# Patient Record
Sex: Female | Born: 1940 | Race: White | Hispanic: No | Marital: Married | Smoking: Never smoker
Health system: Southern US, Community
[De-identification: ages and names within clinical notes are randomized; demographics above are authoritative.]

## PROBLEM LIST (undated history)

## (undated) DIAGNOSIS — I4891 Unspecified atrial fibrillation: Secondary | ICD-10-CM

## (undated) DIAGNOSIS — K219 Gastro-esophageal reflux disease without esophagitis: Secondary | ICD-10-CM

## (undated) DIAGNOSIS — IMO0002 Reserved for concepts with insufficient information to code with codable children: Secondary | ICD-10-CM

## (undated) DIAGNOSIS — I471 Supraventricular tachycardia, unspecified: Secondary | ICD-10-CM

## (undated) DIAGNOSIS — N200 Calculus of kidney: Secondary | ICD-10-CM

## (undated) DIAGNOSIS — E7800 Pure hypercholesterolemia, unspecified: Secondary | ICD-10-CM

## (undated) DIAGNOSIS — N816 Rectocele: Secondary | ICD-10-CM

## (undated) DIAGNOSIS — M1990 Unspecified osteoarthritis, unspecified site: Secondary | ICD-10-CM

## (undated) HISTORY — PX: CHOLECYSTECTOMY: SHX55

## (undated) HISTORY — PX: CATARACT EXTRACTION: SUR2

## (undated) HISTORY — PX: FOOT SURGERY: SHX648

## (undated) HISTORY — PX: APPENDECTOMY: SHX54

## (undated) HISTORY — PX: HAND SURGERY: SHX662

## (undated) HISTORY — PX: ROTATOR CUFF REPAIR: SHX139

## (undated) HISTORY — PX: ABDOMINAL HYSTERECTOMY: SHX81

---

## 1998-01-27 ENCOUNTER — Ambulatory Visit (HOSPITAL_COMMUNITY): Admission: RE | Admit: 1998-01-27 | Discharge: 1998-01-27 | Payer: Self-pay | Admitting: Obstetrics and Gynecology

## 1999-02-04 ENCOUNTER — Ambulatory Visit (HOSPITAL_COMMUNITY): Admission: RE | Admit: 1999-02-04 | Discharge: 1999-02-04 | Payer: Self-pay | Admitting: Internal Medicine

## 1999-02-04 ENCOUNTER — Encounter: Payer: Self-pay | Admitting: Internal Medicine

## 1999-12-29 ENCOUNTER — Other Ambulatory Visit: Admission: RE | Admit: 1999-12-29 | Discharge: 1999-12-29 | Payer: Self-pay | Admitting: Podiatry

## 2000-02-10 ENCOUNTER — Encounter: Payer: Self-pay | Admitting: Obstetrics and Gynecology

## 2000-02-10 ENCOUNTER — Ambulatory Visit (HOSPITAL_COMMUNITY): Admission: RE | Admit: 2000-02-10 | Discharge: 2000-02-10 | Payer: Self-pay | Admitting: Obstetrics and Gynecology

## 2001-03-20 ENCOUNTER — Ambulatory Visit (HOSPITAL_COMMUNITY): Admission: RE | Admit: 2001-03-20 | Discharge: 2001-03-20 | Payer: Self-pay | Admitting: Obstetrics and Gynecology

## 2001-03-20 ENCOUNTER — Encounter: Payer: Self-pay | Admitting: Obstetrics and Gynecology

## 2001-12-14 ENCOUNTER — Ambulatory Visit (HOSPITAL_COMMUNITY): Admission: RE | Admit: 2001-12-14 | Discharge: 2001-12-14 | Payer: Self-pay | Admitting: Otolaryngology

## 2001-12-14 ENCOUNTER — Encounter: Payer: Self-pay | Admitting: Otolaryngology

## 2001-12-19 ENCOUNTER — Encounter: Payer: Self-pay | Admitting: Otolaryngology

## 2001-12-19 ENCOUNTER — Ambulatory Visit (HOSPITAL_COMMUNITY): Admission: RE | Admit: 2001-12-19 | Discharge: 2001-12-19 | Payer: Self-pay | Admitting: Interventional Radiology

## 2002-03-21 ENCOUNTER — Ambulatory Visit (HOSPITAL_COMMUNITY): Admission: RE | Admit: 2002-03-21 | Discharge: 2002-03-21 | Payer: Self-pay | Admitting: Internal Medicine

## 2002-03-21 ENCOUNTER — Encounter: Payer: Self-pay | Admitting: Internal Medicine

## 2002-04-25 ENCOUNTER — Ambulatory Visit (HOSPITAL_COMMUNITY): Admission: RE | Admit: 2002-04-25 | Discharge: 2002-04-25 | Payer: Self-pay | Admitting: Interventional Radiology

## 2003-03-25 ENCOUNTER — Ambulatory Visit (HOSPITAL_COMMUNITY): Admission: RE | Admit: 2003-03-25 | Discharge: 2003-03-25 | Payer: Self-pay | Admitting: Obstetrics and Gynecology

## 2003-06-19 ENCOUNTER — Encounter: Admission: RE | Admit: 2003-06-19 | Discharge: 2003-06-19 | Payer: Self-pay | Admitting: Obstetrics and Gynecology

## 2004-02-09 ENCOUNTER — Ambulatory Visit (HOSPITAL_BASED_OUTPATIENT_CLINIC_OR_DEPARTMENT_OTHER): Admission: RE | Admit: 2004-02-09 | Discharge: 2004-02-09 | Payer: Self-pay | Admitting: Orthopedic Surgery

## 2004-02-09 ENCOUNTER — Ambulatory Visit (HOSPITAL_COMMUNITY): Admission: RE | Admit: 2004-02-09 | Discharge: 2004-02-09 | Payer: Self-pay | Admitting: Orthopedic Surgery

## 2004-03-30 ENCOUNTER — Ambulatory Visit (HOSPITAL_COMMUNITY): Admission: RE | Admit: 2004-03-30 | Discharge: 2004-03-30 | Payer: Self-pay | Admitting: Obstetrics and Gynecology

## 2005-03-31 ENCOUNTER — Ambulatory Visit (HOSPITAL_COMMUNITY): Admission: RE | Admit: 2005-03-31 | Discharge: 2005-03-31 | Payer: Self-pay | Admitting: Obstetrics and Gynecology

## 2005-04-18 ENCOUNTER — Encounter: Admission: RE | Admit: 2005-04-18 | Discharge: 2005-04-18 | Payer: Self-pay | Admitting: Obstetrics and Gynecology

## 2006-04-20 ENCOUNTER — Ambulatory Visit (HOSPITAL_COMMUNITY): Admission: RE | Admit: 2006-04-20 | Discharge: 2006-04-20 | Payer: Self-pay | Admitting: Obstetrics and Gynecology

## 2006-06-20 ENCOUNTER — Ambulatory Visit (HOSPITAL_COMMUNITY): Admission: RE | Admit: 2006-06-20 | Discharge: 2006-06-21 | Payer: Self-pay | Admitting: Orthopedic Surgery

## 2007-04-26 ENCOUNTER — Ambulatory Visit (HOSPITAL_COMMUNITY): Admission: RE | Admit: 2007-04-26 | Discharge: 2007-04-26 | Payer: Self-pay | Admitting: Internal Medicine

## 2008-05-15 ENCOUNTER — Ambulatory Visit (HOSPITAL_COMMUNITY): Admission: RE | Admit: 2008-05-15 | Discharge: 2008-05-15 | Payer: Self-pay | Admitting: Obstetrics and Gynecology

## 2009-05-19 ENCOUNTER — Ambulatory Visit (HOSPITAL_COMMUNITY): Admission: RE | Admit: 2009-05-19 | Discharge: 2009-05-19 | Payer: Self-pay | Admitting: Internal Medicine

## 2009-08-22 ENCOUNTER — Emergency Department (HOSPITAL_BASED_OUTPATIENT_CLINIC_OR_DEPARTMENT_OTHER): Admission: EM | Admit: 2009-08-22 | Discharge: 2009-08-23 | Payer: Self-pay | Admitting: Emergency Medicine

## 2009-08-22 ENCOUNTER — Ambulatory Visit: Payer: Self-pay | Admitting: Diagnostic Radiology

## 2010-02-21 ENCOUNTER — Encounter: Payer: Self-pay | Admitting: Obstetrics and Gynecology

## 2010-04-17 LAB — DIFFERENTIAL
Basophils Absolute: 0 10*3/uL (ref 0.0–0.1)
Basophils Relative: 1 % (ref 0–1)
Eosinophils Absolute: 0.4 10*3/uL (ref 0.0–0.7)
Monocytes Relative: 7 % (ref 3–12)
Neutro Abs: 3.1 10*3/uL (ref 1.7–7.7)
Neutrophils Relative %: 55 % (ref 43–77)

## 2010-04-17 LAB — COMPREHENSIVE METABOLIC PANEL
AST: 101 U/L — ABNORMAL HIGH (ref 0–37)
Albumin: 3.9 g/dL (ref 3.5–5.2)
Alkaline Phosphatase: 97 U/L (ref 39–117)
CO2: 29 mEq/L (ref 19–32)
Calcium: 9.9 mg/dL (ref 8.4–10.5)
Chloride: 105 mEq/L (ref 96–112)
GFR calc non Af Amer: >60 mL/min (ref >60)
Sodium: 144 mEq/L (ref 135–145)
Total Protein: 6.9 g/dL (ref 6.0–8.3)

## 2010-04-17 LAB — CBC
Hemoglobin: 13.6 g/dL (ref 12.0–15.0)
RBC: 4.4 MIL/uL (ref 3.87–5.11)
WBC: 5.5 10*3/uL (ref 4.0–10.5)

## 2010-04-19 ENCOUNTER — Other Ambulatory Visit (HOSPITAL_COMMUNITY): Payer: Self-pay | Admitting: *Deleted

## 2010-04-19 ENCOUNTER — Other Ambulatory Visit: Payer: Self-pay

## 2010-04-19 DIAGNOSIS — Z1231 Encounter for screening mammogram for malignant neoplasm of breast: Secondary | ICD-10-CM

## 2010-05-26 ENCOUNTER — Ambulatory Visit (HOSPITAL_COMMUNITY): Payer: Medicare Other

## 2010-06-02 ENCOUNTER — Ambulatory Visit (HOSPITAL_COMMUNITY)
Admission: RE | Admit: 2010-06-02 | Discharge: 2010-06-02 | Disposition: A | Discharge status: Home / Self Care | Payer: Medicare Other | Source: Ambulatory Visit | Attending: Obstetrics and Gynecology | Admitting: Obstetrics and Gynecology

## 2010-06-02 DIAGNOSIS — Z1231 Encounter for screening mammogram for malignant neoplasm of breast: Secondary | ICD-10-CM | POA: Insufficient documentation

## 2010-06-11 ENCOUNTER — Other Ambulatory Visit (HOSPITAL_COMMUNITY): Payer: Self-pay | Admitting: Obstetrics and Gynecology

## 2010-06-11 DIAGNOSIS — Z1231 Encounter for screening mammogram for malignant neoplasm of breast: Secondary | ICD-10-CM

## 2010-06-15 NOTE — Op Note (Signed)
Donna Wiley, Donna Wiley              ACCOUNT NO.:  [id number]   MEDICAL RECORD NO.:  [id number]          PATIENT TYPE:  OIB   LOCATION:  2550                         FACILITY:  [facility]   PHYSICIAN:  Dionne Ano. 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   POSTOPERATIVE DIAGNOSIS:  Left shoulder rotator cuff tear with  associated acromioclavicular joint arthritis and impingement syndrome  with a type 2 acromion morphology.   PROCEDURE:  1. Evaluation under anesthesia, right shoulder.  2. Right shoulder open distal clavicle excision.  3. Right shoulder subacromial decompression and bursectomy open in      nature.  4. Right shoulder infraspinatus rotator cuff repair utilizing two 5.5      Arthrex suture anchors and two 4.5 push lock anchors.   SURGEON:  Dionne Ano. Amanda Pea, M.D.   ASSISTANT:  Karie Chimera, P.A.-C.   COMPLICATIONS:  None.   INDICATIONS FOR PROCEDURE:  This patient is a 70 year old female who  presents for the above mentioned diagnosis.  I have counseled her in  regards to the risks and benefits of surgery including the risks of  infection, bleeding, anesthesia, damage to normal structures, and  failure of the surgery to accomplish its intended goals of relieving  symptoms and restoring function.  With this in mind, she desires to  proceed.  All questions have been encouraged and answered  preoperatively.   OPERATIVE PROCEDURE:  The patient was seen by myself and anesthesia,  taken to the operating suite, underwent prophylactic antibiotic  administration in the holding area. In addition to this, she was given  an interscalene block.  Once in the operative suite, she was placed in a  beach chair  position after general anesthesia was instituted.  She was  appropriately padded, pulses checked, and a sterile field was secured  with Hibiclens prep x2 separate preps.  Following this, the patient then  underwent sterile draping.  Outline marks were made about the shoulder  and the operation commenced with a transverse incision along Langer's  line, 1 to 1.5 cm below the anterolateral border of the acromion and  swinging posteriorly.  The deltoid musculature was identified and a  plane between the deltoid and subcu was created with scissors tip.   Following this, I entered the interval between the anterior and middle  deltoid fibers and was able to gain access to the shoulder joint.  The  patient's bursa was identified and she underwent a complete bursectomy  without difficulty.  She had a large amount of very thickened and  inflamed bursal tissue.  Following this, the patient then underwent  identification of the rotator cuff.  There was an obvious large  infraspinatus tear.  Once this was done, I then performed evaluation of  the patient's subacromial space which showed a large subacromial spur  and a types 2-3 acromion morphology.  With  a of combination rasp,  rongeur, and cautery device, I performed a subacromial decompression.  Thus, bursectomy and subacromial decompression were performed without  difficulty.   I did leave the coracoacromial arch intact.  At this time, I made a  small incision over the distal clavicle and dissected down to remove the  distal cm of the clavicle with an oscillating saw.  The patient had the  area washed out nicely and Vicryl suture was used to close the interval  between the muscle and fascia.  Following this, this was irrigated and  closed with Prolene.   Attention was then turned towards the rotator cuff.  The rotator cuff  was mobilized. Nonviable rotator cuff tissue was sharply excised with  the knife blade and following this, I then performed  a very careful  slight decortication of the area just lateral to the articular surface.  This was fairly significant, 3.5 to 4 cm wide, cuff tear.  Following  this, I placed two 5.5 Arthrex suture anchors just off of the articular  bony surface interval.  Once this was done with two 5.5 Arthrex suture  anchors, I then placed the eight limbs of the suture through the rotator  cuff quite medially.  This was then tied down nicely and advanced the  rotator cuff.  Excellent rotator cuff tissue was advanced into the space  created.  Once this was done, I then performed placement of two 4.5 push  lock anchors to further harness the repair.  I was pleased with the  findings.  I felt that we had a very nice repair of the tissue and full  range of motion was restored with no evidence of impingement.   Thus, rotator cuff repair, subacromial decompression, distal clavicle  excision, and bursectomy was performed without difficulty.  Following  this, the interval about the deltoid was repaired and the subcu was  repaired with Vicryl followed by closure of the skin edge with Prolene.  I was pleased with the findings and the closure.  Following this, she  was placed in a sterile dressing and shoulder immobilization device.  She will be admitted for IV antibiotics, general postop observation, and  will be admitted 23 hours.  All questions have been encouraged and  answered.   She will be seen back in the office in 7-10 days.  She will begin  passive range of motion for the first four weeks, active assisted range  of motion weeks 4 through 8, active range of motion at week 8 to 10.  We  will begin some specific support training as necessary at approximately  14 to 16 weeks postop.  The patient was quite well and pleased with the  progress.  The patient should do quite well, in my opinion, and had an  excellent repair.           ______________________________ Dionne Ano. Everlene Other, M.D.      Nash Mantis  D:  06/20/2006  T:  06/20/2006  Job:  [id number]

## 2010-11-08 ENCOUNTER — Encounter: Payer: Self-pay | Admitting: Family Medicine

## 2010-11-08 ENCOUNTER — Emergency Department (INDEPENDENT_AMBULATORY_CARE_PROVIDER_SITE_OTHER): Payer: Medicare Other

## 2010-11-08 ENCOUNTER — Emergency Department (HOSPITAL_BASED_OUTPATIENT_CLINIC_OR_DEPARTMENT_OTHER)
Admission: EM | Admit: 2010-11-08 | Discharge: 2010-11-08 | Disposition: A | Discharge status: Home / Self Care | Payer: Medicare Other | Attending: Emergency Medicine | Admitting: Emergency Medicine

## 2010-11-08 DIAGNOSIS — K219 Gastro-esophageal reflux disease without esophagitis: Secondary | ICD-10-CM | POA: Insufficient documentation

## 2010-11-08 DIAGNOSIS — Z8739 Personal history of other diseases of the musculoskeletal system and connective tissue: Secondary | ICD-10-CM | POA: Insufficient documentation

## 2010-11-08 DIAGNOSIS — N390 Urinary tract infection, site not specified: Secondary | ICD-10-CM | POA: Insufficient documentation

## 2010-11-08 DIAGNOSIS — R109 Unspecified abdominal pain: Secondary | ICD-10-CM | POA: Insufficient documentation

## 2010-11-08 DIAGNOSIS — E119 Type 2 diabetes mellitus without complications: Secondary | ICD-10-CM | POA: Insufficient documentation

## 2010-11-08 DIAGNOSIS — M549 Dorsalgia, unspecified: Secondary | ICD-10-CM

## 2010-11-08 HISTORY — DX: Rectocele: N81.6

## 2010-11-08 HISTORY — DX: Pure hypercholesterolemia, unspecified: E78.00

## 2010-11-08 HISTORY — DX: Reserved for concepts with insufficient information to code with codable children: IMO0002

## 2010-11-08 HISTORY — DX: Unspecified atrial fibrillation: I48.91

## 2010-11-08 HISTORY — DX: Unspecified osteoarthritis, unspecified site: M19.90

## 2010-11-08 HISTORY — DX: Supraventricular tachycardia, unspecified: I47.10

## 2010-11-08 HISTORY — DX: Supraventricular tachycardia: I47.1

## 2010-11-08 HISTORY — DX: Gastro-esophageal reflux disease without esophagitis: K21.9

## 2010-11-08 LAB — BASIC METABOLIC PANEL
BUN: 23 mg/dL (ref 6–23)
CO2: 27 mEq/L (ref 19–32)
Calcium: 9.7 mg/dL (ref 8.4–10.5)
Creatinine, Ser: 0.7 mg/dL (ref 0.50–1.10)
GFR calc Af Amer: >90 mL/min (ref >90)
GFR calc non Af Amer: 86 mL/min — ABNORMAL LOW (ref >90)
Potassium: 3.6 mEq/L (ref 3.5–5.1)
Sodium: 139 mEq/L (ref 135–145)

## 2010-11-08 LAB — URINALYSIS, ROUTINE W REFLEX MICROSCOPIC
Bilirubin Urine: NEGATIVE
Glucose, UA: NEGATIVE mg/dL
Protein, ur: >300 mg/dL — AB
Specific Gravity, Urine: 1.024 (ref 1.005–1.030)
Urobilinogen, UA: 0.2 mg/dL (ref 0.0–1.0)
pH: 6 (ref 5.0–8.0)

## 2010-11-08 LAB — URINE MICROSCOPIC-ADD ON

## 2010-11-08 LAB — CBC
HCT: 40.2 % (ref 36.0–46.0)
MCV: 90.1 fL (ref 78.0–100.0)
Platelets: 257 10*3/uL (ref 150–400)
RBC: 4.46 MIL/uL (ref 3.87–5.11)
RDW: 12.9 % (ref 11.5–15.5)
WBC: 10.1 10*3/uL (ref 4.0–10.5)

## 2010-11-08 LAB — DIFFERENTIAL
Lymphocytes Relative: 8 % — ABNORMAL LOW (ref 12–46)
Neutro Abs: 8.4 10*3/uL — ABNORMAL HIGH (ref 1.7–7.7)

## 2010-11-08 LAB — PROTIME-INR: Prothrombin Time: 15.9 seconds — ABNORMAL HIGH (ref 11.6–15.2)

## 2010-11-08 MED ORDER — IOHEXOL 300 MG/ML  SOLN
100.0000 mL | Freq: Once | INTRAMUSCULAR | Status: AC | PRN
  Administered 2010-11-08: 100 mL via INTRAVENOUS

## 2010-11-08 MED ORDER — MORPHINE SULFATE 2 MG/ML IJ SOLN
2.0000 mg | Freq: Once | INTRAMUSCULAR | Status: AC
  Administered 2010-11-08: 2 mg via INTRAVENOUS
  Filled 2010-11-08: qty 1

## 2010-11-08 MED ORDER — HYDROCORTISONE SOD SUCCINATE 100 MG IJ SOLR
100.0000 mg | Freq: Once | INTRAMUSCULAR | Status: AC
  Administered 2010-11-08: 100 mg via INTRAVENOUS
  Filled 2010-11-08: qty 2

## 2010-11-08 MED ORDER — NITROFURANTOIN MONOHYD MACRO 100 MG PO CAPS: 100.0000 mg | ORAL_CAPSULE | Freq: Two times a day (BID) | ORAL | Status: AC

## 2010-11-08 MED ORDER — DEXTROSE 5 % IV SOLN
1.0000 g | Freq: Once | INTRAVENOUS | Status: AC
  Administered 2010-11-08: 1 g via INTRAVENOUS
  Filled 2010-11-08: qty 1

## 2010-11-08 MED ORDER — DIPHENHYDRAMINE HCL 50 MG/ML IJ SOLN
50.0000 mg | Freq: Once | INTRAMUSCULAR | Status: AC
  Administered 2010-11-08: 50 mg via INTRAVENOUS
  Filled 2010-11-08: qty 1

## 2010-11-08 MED ORDER — SODIUM CHLORIDE 0.9 % IV SOLN
INTRAVENOUS | Status: DC
  Administered 2010-11-08: 12:00:00 via INTRAVENOUS

## 2010-11-08 MED ORDER — SODIUM CHLORIDE 0.9 % IV BOLUS (SEPSIS)
1000.0000 mL | Freq: Once | INTRAVENOUS | Status: AC
  Administered 2010-11-08: 1000 mL via INTRAVENOUS

## 2010-11-08 NOTE — ED Notes (Signed)
Pt pre-medicated for CT

## 2010-11-08 NOTE — ED Provider Notes (Signed)
History     CSN: [id number] Arrival date & time: 11/08/2010  8:29 AM  Chief Complaint  Patient presents with  . Flank Pain    (Consider location/radiation/quality/duration/timing/severity/associated sxs/prior treatment) HPI Pt reports she has had bilateral lower back pain for the last 2 days, constant, moderate aching pain that has since migrated to her bilateral lower abdomen. Pain is associated with urinary frequency but no dysuria. She has had no nausea, vomiting, diarrhea. No fever. No blood in stool or urine. No prior history of same. She is taking Xarelto for atrial fibrillation, but denies any trauma or falls.    Past Medical History  Diagnosis Date  . Diabetes mellitus   . SVT (supraventricular tachycardia)   . Arthritis   . GERD (gastroesophageal reflux disease)   . Asthma   . High cholesterol   . Cystocele   . Rectocele   . Atrial fibrillation     Past Surgical History  Procedure Date  . Abdominal hysterectomy   . Cataract extraction   . Foot surgery   . Cholecystectomy   . Appendectomy     No family history on file.  History  Substance Use Topics  . Smoking status: Never Smoker   . Smokeless tobacco: Not on file  . Alcohol Use: No    OB History    Grav Para Term Preterm Abortions TAB SAB Ect Mult Living                  Review of Systems All other systems reviewed and are negative except as noted in HPI.   Allergies  Codeine; Ivp dye; Other; and Sulfa antibiotics  Home Medications   Current Outpatient Rx  Name Route Sig Dispense Refill  . DILTIAZEM HCL COATED BEADS 180 MG PO CP24 Oral Take 180 mg by mouth daily.      Marland Kitchen FLECAINIDE ACETATE 150 MG PO TABS Oral Take 150 mg by mouth 2 (two) times daily.      Marland Kitchen GLIPIZIDE 5 MG PO TABS Oral Take 5 mg by mouth 2 (two) times daily before a meal.      . RIVAROXABAN 20 MG PO TABS Oral Take by mouth.      Marland Kitchen SIMVASTATIN 20 MG PO TABS Oral Take 20 mg by mouth at bedtime.      Marland Kitchen ZOLPIDEM TARTRATE 5 MG PO  TABS Oral Take 5 mg by mouth at bedtime as needed.        BP 147/70  Pulse 94  Temp(Src) 98.9 F (37.2 C) (Oral)  Resp 20  SpO2 99%  Physical Exam  Nursing note and vitals reviewed. Constitutional: She is oriented to person, place, and time. She appears well-developed and well-nourished.  HENT:  Head: Normocephalic and atraumatic.  Eyes: EOM are normal. Pupils are equal, round, and reactive to light.  Neck: Normal range of motion. Neck supple.  Cardiovascular: Normal rate, normal heart sounds and intact distal pulses.   Pulmonary/Chest: Effort normal and breath sounds normal.  Abdominal: Bowel sounds are normal. She exhibits no distension. There is no tenderness.  Genitourinary:       Mild L CVA tenderness  Musculoskeletal: Normal range of motion. She exhibits tenderness. She exhibits no edema.       Tender over the lower back L>R   Neurological: She is alert and oriented to person, place, and time. She has normal strength. No cranial nerve deficit or sensory deficit.  Skin: Skin is warm and dry. No rash noted.  Psychiatric: She  has a normal mood and affect.    ED Course  Procedures (including critical care time)   Labs Reviewed  CBC  DIFFERENTIAL  BASIC METABOLIC PANEL  URINALYSIS, ROUTINE W REFLEX MICROSCOPIC  URINE CULTURE   No results found.   No diagnosis found.    MDM  Pt reports an IV contrast dye allergy, but on further questioning states she has a severe seafood allergy and has been told she couldn't have IV dye due to the iodine. She has had non-iodinated IV dye before without incidence. Will pretreat with hydrocortisone and benadryl to be safe, but we do not use iodinate CT contrast and I suspect the likelihood of an anaphylactic reaction will be low.       12:29 PM Pt still having urinary frequency but otherwise doing well. UA consistent with UTI/Pyelo but no stones or other concerning findings on CT such as retroperitoneal hematoma. Pt given  Rocephin in the ED. Will d/c with Macrobid and PCP follow up. Culture sent.   Charles B. Bernette Mayers, MD 11/08/10 1231

## 2010-11-08 NOTE — ED Notes (Signed)
Pt c/o left flank pain since Saturday with urinary frequency and dysuria. Pt sts pain moved across low abdomen this morning. Pt denies fever, n/v/d.

## 2010-11-10 LAB — URINE CULTURE: Culture  Setup Time: 201210081450

## 2010-11-11 NOTE — ED Notes (Signed)
+   Urine] Patient treated with Macrobid-sensitive to same-chart appended per protocol MD. 

## 2011-05-06 ENCOUNTER — Other Ambulatory Visit (HOSPITAL_COMMUNITY): Payer: Self-pay | Admitting: Obstetrics and Gynecology

## 2011-05-06 DIAGNOSIS — Z1231 Encounter for screening mammogram for malignant neoplasm of breast: Secondary | ICD-10-CM

## 2011-06-08 ENCOUNTER — Ambulatory Visit (HOSPITAL_COMMUNITY)
Admission: RE | Admit: 2011-06-08 | Discharge: 2011-06-08 | Disposition: A | Discharge status: Home / Self Care | Payer: Medicare Other | Source: Ambulatory Visit | Attending: Obstetrics and Gynecology | Admitting: Obstetrics and Gynecology

## 2011-06-08 DIAGNOSIS — Z1231 Encounter for screening mammogram for malignant neoplasm of breast: Secondary | ICD-10-CM | POA: Insufficient documentation

## 2011-11-29 ENCOUNTER — Other Ambulatory Visit: Payer: Self-pay | Admitting: Otolaryngology

## 2011-11-29 DIAGNOSIS — H905 Unspecified sensorineural hearing loss: Secondary | ICD-10-CM

## 2011-12-06 ENCOUNTER — Ambulatory Visit
Admission: RE | Admit: 2011-12-06 | Discharge: 2011-12-06 | Disposition: A | Discharge status: Home / Self Care | Payer: Medicare Other | Source: Ambulatory Visit | Attending: Otolaryngology | Admitting: Otolaryngology

## 2011-12-06 DIAGNOSIS — H905 Unspecified sensorineural hearing loss: Secondary | ICD-10-CM

## 2012-05-09 ENCOUNTER — Other Ambulatory Visit (HOSPITAL_COMMUNITY): Payer: Self-pay | Admitting: Obstetrics and Gynecology

## 2012-05-09 DIAGNOSIS — Z1231 Encounter for screening mammogram for malignant neoplasm of breast: Secondary | ICD-10-CM

## 2012-06-13 ENCOUNTER — Ambulatory Visit (HOSPITAL_COMMUNITY): Payer: Medicare Other

## 2012-06-20 ENCOUNTER — Ambulatory Visit (HOSPITAL_COMMUNITY)
Admission: RE | Admit: 2012-06-20 | Discharge: 2012-06-20 | Disposition: A | Discharge status: Home / Self Care | Payer: Medicare Other | Source: Ambulatory Visit | Attending: Obstetrics and Gynecology | Admitting: Obstetrics and Gynecology

## 2012-06-20 DIAGNOSIS — Z1231 Encounter for screening mammogram for malignant neoplasm of breast: Secondary | ICD-10-CM

## 2013-05-20 ENCOUNTER — Other Ambulatory Visit (HOSPITAL_COMMUNITY): Payer: Self-pay | Admitting: Internal Medicine

## 2013-05-20 DIAGNOSIS — Z1231 Encounter for screening mammogram for malignant neoplasm of breast: Secondary | ICD-10-CM

## 2013-06-25 ENCOUNTER — Ambulatory Visit (HOSPITAL_COMMUNITY)
Admission: RE | Admit: 2013-06-25 | Discharge: 2013-06-25 | Disposition: A | Discharge status: Home / Self Care | Payer: Medicare Other | Source: Ambulatory Visit | Attending: Internal Medicine | Admitting: Internal Medicine

## 2013-06-25 DIAGNOSIS — Z1231 Encounter for screening mammogram for malignant neoplasm of breast: Secondary | ICD-10-CM

## 2014-05-27 ENCOUNTER — Other Ambulatory Visit (HOSPITAL_COMMUNITY): Payer: Self-pay | Admitting: Obstetrics and Gynecology

## 2014-05-27 DIAGNOSIS — Z1231 Encounter for screening mammogram for malignant neoplasm of breast: Secondary | ICD-10-CM

## 2014-07-01 ENCOUNTER — Ambulatory Visit (HOSPITAL_COMMUNITY)
Admission: RE | Admit: 2014-07-01 | Discharge: 2014-07-01 | Disposition: A | Discharge status: Home / Self Care | Payer: Medicare Other | Source: Ambulatory Visit | Attending: Obstetrics and Gynecology | Admitting: Obstetrics and Gynecology

## 2014-07-01 DIAGNOSIS — Z1231 Encounter for screening mammogram for malignant neoplasm of breast: Secondary | ICD-10-CM | POA: Diagnosis present

## 2014-08-11 ENCOUNTER — Encounter (HOSPITAL_BASED_OUTPATIENT_CLINIC_OR_DEPARTMENT_OTHER): Payer: Self-pay | Admitting: *Deleted

## 2014-08-11 ENCOUNTER — Emergency Department (HOSPITAL_BASED_OUTPATIENT_CLINIC_OR_DEPARTMENT_OTHER)
Admission: EM | Admit: 2014-08-11 | Discharge: 2014-08-11 | Disposition: A | Discharge status: Home / Self Care | Payer: Medicare Other | Attending: Emergency Medicine | Admitting: Emergency Medicine

## 2014-08-11 DIAGNOSIS — E780 Pure hypercholesterolemia: Secondary | ICD-10-CM | POA: Insufficient documentation

## 2014-08-11 DIAGNOSIS — T7840XA Allergy, unspecified, initial encounter: Secondary | ICD-10-CM | POA: Diagnosis present

## 2014-08-11 DIAGNOSIS — Z7901 Long term (current) use of anticoagulants: Secondary | ICD-10-CM | POA: Insufficient documentation

## 2014-08-11 DIAGNOSIS — Z8739 Personal history of other diseases of the musculoskeletal system and connective tissue: Secondary | ICD-10-CM | POA: Insufficient documentation

## 2014-08-11 DIAGNOSIS — Y9289 Other specified places as the place of occurrence of the external cause: Secondary | ICD-10-CM | POA: Diagnosis not present

## 2014-08-11 DIAGNOSIS — R220 Localized swelling, mass and lump, head: Secondary | ICD-10-CM | POA: Insufficient documentation

## 2014-08-11 DIAGNOSIS — Z79899 Other long term (current) drug therapy: Secondary | ICD-10-CM | POA: Insufficient documentation

## 2014-08-11 DIAGNOSIS — I4891 Unspecified atrial fibrillation: Secondary | ICD-10-CM | POA: Insufficient documentation

## 2014-08-11 DIAGNOSIS — Y9389 Activity, other specified: Secondary | ICD-10-CM | POA: Diagnosis not present

## 2014-08-11 DIAGNOSIS — Z8719 Personal history of other diseases of the digestive system: Secondary | ICD-10-CM | POA: Diagnosis not present

## 2014-08-11 DIAGNOSIS — I471 Supraventricular tachycardia: Secondary | ICD-10-CM | POA: Diagnosis not present

## 2014-08-11 DIAGNOSIS — X58XXXA Exposure to other specified factors, initial encounter: Secondary | ICD-10-CM | POA: Diagnosis not present

## 2014-08-11 DIAGNOSIS — E119 Type 2 diabetes mellitus without complications: Secondary | ICD-10-CM | POA: Diagnosis not present

## 2014-08-11 DIAGNOSIS — J45909 Unspecified asthma, uncomplicated: Secondary | ICD-10-CM | POA: Diagnosis not present

## 2014-08-11 DIAGNOSIS — Y998 Other external cause status: Secondary | ICD-10-CM | POA: Diagnosis not present

## 2014-08-11 MED ORDER — PREDNISONE 10 MG PO TABS: 20.0000 mg | ORAL_TABLET | Freq: Two times a day (BID) | ORAL | Status: DC

## 2014-08-11 MED ORDER — PREDNISONE 20 MG PO TABS
40.0000 mg | ORAL_TABLET | Freq: Once | ORAL | Status: AC
  Administered 2014-08-11: 40 mg via ORAL
  Filled 2014-08-11: qty 2

## 2014-08-11 NOTE — ED Notes (Addendum)
Pt c/o "allergic reaction  To strawberries" x 1 hr ago Benadryl 50 mg PTA , pt states tongue is large, no difficulty speaking and no resp distress noted

## 2014-08-11 NOTE — Discharge Instructions (Signed)
Prednisone as prescribed.  Benadryl 25 mg every 6 hours for the next 2 days.  Return to the emergency department if symptoms significantly worsen or change.   Allergies Allergies may happen from anything your body is sensitive to. This may be food, medicines, pollens, chemicals, and nearly anything around you in everyday life that produces allergens. An allergen is anything that causes an allergy producing substance. Heredity is often a factor in causing these problems. This means you may have some of the same allergies as your parents. Food allergies happen in all age groups. Food allergies are some of the most severe and life threatening. Some common food allergies are cow's milk, seafood, eggs, nuts, wheat, and soybeans. SYMPTOMS   Swelling around the mouth.  An itchy red rash or hives.  Vomiting or diarrhea.  Difficulty breathing. SEVERE ALLERGIC REACTIONS ARE LIFE-THREATENING. This reaction is called anaphylaxis. It can cause the mouth and throat to swell and cause difficulty with breathing and swallowing. In severe reactions only a trace amount of food (for example, peanut oil in a salad) may cause death within seconds. Seasonal allergies occur in all age groups. These are seasonal because they usually occur during the same season every year. They may be a reaction to molds, grass pollens, or tree pollens. Other causes of problems are house dust mite allergens, pet dander, and mold spores. The symptoms often consist of nasal congestion, a runny itchy nose associated with sneezing, and tearing itchy eyes. There is often an associated itching of the mouth and ears. The problems happen when you come in contact with pollens and other allergens. Allergens are the particles in the air that the body reacts to with an allergic reaction. This causes you to release allergic antibodies. Through a chain of events, these eventually cause you to release histamine into the blood stream. Although it is  meant to be protective to the body, it is this release that causes your discomfort. This is why you were given anti-histamines to feel better. If you are unable to pinpoint the offending allergen, it may be determined by skin or blood testing. Allergies cannot be cured but can be controlled with medicine. Hay fever is a collection of all or some of the seasonal allergy problems. It may often be treated with simple over-the-counter medicine such as diphenhydramine. Take medicine as directed. Do not drink alcohol or drive while taking this medicine. Check with your caregiver or package insert for child dosages. If these medicines are not effective, there are many new medicines your caregiver can prescribe. Stronger medicine such as nasal spray, eye drops, and corticosteroids may be used if the first things you try do not work well. Other treatments such as immunotherapy or desensitizing injections can be used if all else fails. Follow up with your caregiver if problems continue. These seasonal allergies are usually not life threatening. They are generally more of a nuisance that can often be handled using medicine. HOME CARE INSTRUCTIONS   If unsure what causes a reaction, keep a diary of foods eaten and symptoms that follow. Avoid foods that cause reactions.  If hives or rash are present:  Take medicine as directed.  You may use an over-the-counter antihistamine (diphenhydramine) for hives and itching as needed.  Apply cold compresses (cloths) to the skin or take baths in cool water. Avoid hot baths or showers. Heat will make a rash and itching worse.  If you are severely allergic:  Following a treatment for a severe reaction, hospitalization  is often required for closer follow-up.  Wear a medic-alert bracelet or necklace stating the allergy.  You and your family must learn how to give adrenaline or use an anaphylaxis kit.  If you have had a severe reaction, always carry your anaphylaxis kit  or EpiPen with you. Use this medicine as directed by your caregiver if a severe reaction is occurring. Failure to do so could have a fatal outcome. SEEK MEDICAL CARE IF:  You suspect a food allergy. Symptoms generally happen within 30 minutes of eating a food.  Your symptoms have not gone away within 2 days or are getting worse.  You develop new symptoms.  You want to retest yourself or your child with a food or drink you think causes an allergic reaction. Never do this if an anaphylactic reaction to that food or drink has happened before. Only do this under the care of a caregiver. SEEK IMMEDIATE MEDICAL CARE IF:   You have difficulty breathing, are wheezing, or have a tight feeling in your chest or throat.  You have a swollen mouth, or you have hives, swelling, or itching all over your body.  You have had a severe reaction that has responded to your anaphylaxis kit or an EpiPen. These reactions may return when the medicine has worn off. These reactions should be considered life threatening. MAKE SURE YOU:   Understand these instructions.  Will watch your condition.  Will get help right away if you are not doing well or get worse. Document Released: 04/12/2002 Document Revised: 05/14/2012 Document Reviewed: 09/17/2007 [facility] Patient Information 2015 [place], [place]. This information is not intended to replace advice given to you by your health care provider. Make sure you discuss any questions you have with your health care provider.

## 2014-08-11 NOTE — ED Provider Notes (Signed)
CSN: [id number]     Arrival date & time 08/11/14  2027 History   This chart was scribed for Geoffery Lyonsouglas Tecora Eustache, MD by Budd PalmerVanessa Prueter, ED Scribe. This patient was seen in room MH05/MH05 and the patient's care was started at 9:36 PM.    Chief Complaint  Patient presents with  . Allergic Reaction    Patient is a 74 y.o. female presenting with allergic reaction. The history is provided by the patient. No language interpreter was used.  Allergic Reaction Presenting symptoms: swelling   Severity:  Moderate Prior allergic episodes:  Food/nut allergies Context: food   Relieved by:  None tried Worsened by:  Nothing tried Ineffective treatments:  None tried  HPI Comments: Donna Wiley is a 74 y.o. female who presents to the Emergency Department complaining of an allergic reaction 1 hour ago. She reports associated tongue swelling and throat tingling, but no swelling. Pt suspects it was most likely caused by eating strawberries. She notes a PMHx of heart disease and IDDM. Pt states that she is feeling better since taking 2 benadryl PTA. She denies CP.   Past Medical History  Diagnosis Date  . Diabetes mellitus   . SVT (supraventricular tachycardia)   . Arthritis   . GERD (gastroesophageal reflux disease)   . Asthma   . High cholesterol   . Cystocele   . Rectocele   . Atrial fibrillation    Past Surgical History  Procedure Laterality Date  . Abdominal hysterectomy    . Cataract extraction    . Foot surgery    . Cholecystectomy    . Appendectomy     History reviewed. No pertinent family history. History  Substance Use Topics  . Smoking status: Never Smoker   . Smokeless tobacco: Not on file  . Alcohol Use: No   OB History    No data available     Review of Systems  Cardiovascular: Negative for chest pain.  All other systems reviewed and are negative.   Allergies  Codeine; Ivp dye; Other; Shellfish allergy; and Sulfa antibiotics  Home Medications   Prior to Admission  medications   Medication Sig Start Date End Date Taking? Authorizing Provider  diltiazem (CARTIA XT) 180 MG 24 hr capsule Take 180 mg by mouth daily.      Historical Provider, MD  flecainide (TAMBOCOR) 150 MG tablet Take 150 mg by mouth 2 (two) times daily.      Historical Provider, MD  glipiZIDE (GLUCOTROL) 5 MG tablet Take 5 mg by mouth 2 (two) times daily before a meal.      Historical Provider, MD  Rivaroxaban (XARELTO) 20 MG TABS Take by mouth.      Historical Provider, MD  simvastatin (ZOCOR) 20 MG tablet Take 20 mg by mouth at bedtime.      Historical Provider, MD  zolpidem (AMBIEN) 5 MG tablet Take 5 mg by mouth at bedtime as needed.      Historical Provider, MD   BP 185/85 mmHg  Pulse 76  Temp(Src) 98.9 F (37.2 C) (Oral)  Resp 16  Wt 138 lb (62.596 kg)  SpO2 99% Physical Exam  Constitutional: She is oriented to person, place, and time. She appears well-developed and well-nourished. No distress.  HENT:  Head: Normocephalic and atraumatic.  Mouth/Throat: Oropharynx is clear and moist.  There is mild, if any swelling to the tongue.  Eyes: Conjunctivae and EOM are normal. Pupils are equal, round, and reactive to light.  Neck: Normal range of motion. Neck  supple. No tracheal deviation present.  Cardiovascular: Normal rate, regular rhythm and normal heart sounds.   Pulmonary/Chest: Effort normal and breath sounds normal. No respiratory distress.  Abdominal: Soft.  Musculoskeletal: Normal range of motion.  Neurological: She is alert and oriented to person, place, and time.  Skin: Skin is warm and dry.  Psychiatric: She has a normal mood and affect. Her behavior is normal.  Nursing note and vitals reviewed.   ED Course  Procedures  DIAGNOSTIC STUDIES: Oxygen Saturation is 99% on RA, normal by my interpretation.    COORDINATION OF CARE: 9:40PM - Discussed plans to observe for a while longer. Will order prednisone. Pt advised of plan for treatment and pt agrees.  Labs  Review Labs Reviewed - No data to display  Imaging Review No results found.   EKG Interpretation None      MDM   Final diagnoses:  None    Patient presents with complaints of tongue swelling after eating strawberries one hour prior to arrival. She took Benadryl prior to coming here and symptoms are improving. She has no stridor, wheezing, and is hemodynamically stable. She was given a dose of prednisone here in the ER and observed for an additional hour. She is feeling much better and she feels as though her swelling is improving. She will be discharged with 2 days worth of prednisone and anti-histamines at home and when necessary return.  I personally performed the services described in this documentation, which was scribed in my presence. The recorded information has been reviewed and is accurate.       Geoffery Lyons, MD 08/11/14 [phone]

## 2015-06-01 ENCOUNTER — Other Ambulatory Visit: Payer: Self-pay

## 2015-06-01 DIAGNOSIS — Z1231 Encounter for screening mammogram for malignant neoplasm of breast: Secondary | ICD-10-CM

## 2015-07-06 ENCOUNTER — Ambulatory Visit
Admission: RE | Admit: 2015-07-06 | Discharge: 2015-07-06 | Disposition: A | Discharge status: Home / Self Care | Payer: Medicare Other | Source: Ambulatory Visit

## 2015-07-06 ENCOUNTER — Other Ambulatory Visit: Payer: Self-pay | Admitting: Internal Medicine

## 2015-07-06 DIAGNOSIS — Z1231 Encounter for screening mammogram for malignant neoplasm of breast: Secondary | ICD-10-CM

## 2015-12-10 ENCOUNTER — Emergency Department (HOSPITAL_BASED_OUTPATIENT_CLINIC_OR_DEPARTMENT_OTHER)
Admission: EM | Admit: 2015-12-10 | Discharge: 2015-12-10 | Disposition: A | Discharge status: Other Acute Inpatient Hospital | Payer: Medicare Other | Attending: Emergency Medicine | Admitting: Emergency Medicine

## 2015-12-10 ENCOUNTER — Emergency Department (HOSPITAL_BASED_OUTPATIENT_CLINIC_OR_DEPARTMENT_OTHER): Payer: Medicare Other

## 2015-12-10 ENCOUNTER — Encounter (HOSPITAL_BASED_OUTPATIENT_CLINIC_OR_DEPARTMENT_OTHER): Payer: Self-pay

## 2015-12-10 DIAGNOSIS — R319 Hematuria, unspecified: Secondary | ICD-10-CM | POA: Diagnosis present

## 2015-12-10 DIAGNOSIS — Z794 Long term (current) use of insulin: Secondary | ICD-10-CM | POA: Insufficient documentation

## 2015-12-10 DIAGNOSIS — Z7901 Long term (current) use of anticoagulants: Secondary | ICD-10-CM | POA: Insufficient documentation

## 2015-12-10 DIAGNOSIS — J45909 Unspecified asthma, uncomplicated: Secondary | ICD-10-CM | POA: Diagnosis not present

## 2015-12-10 DIAGNOSIS — N200 Calculus of kidney: Secondary | ICD-10-CM | POA: Diagnosis not present

## 2015-12-10 DIAGNOSIS — E119 Type 2 diabetes mellitus without complications: Secondary | ICD-10-CM | POA: Diagnosis not present

## 2015-12-10 DIAGNOSIS — Z79899 Other long term (current) drug therapy: Secondary | ICD-10-CM | POA: Insufficient documentation

## 2015-12-10 HISTORY — DX: Calculus of kidney: N20.0

## 2015-12-10 LAB — CBC WITH DIFFERENTIAL/PLATELET
BASOS ABS: 0 10*3/uL (ref 0.0–0.1)
BASOS PCT: 0 %
Eosinophils Absolute: 0.1 10*3/uL (ref 0.0–0.7)
Eosinophils Relative: 2 %
HEMATOCRIT: 36.1 % (ref 36.0–46.0)
HEMOGLOBIN: 12 g/dL (ref 12.0–15.0)
LYMPHS PCT: 17 %
Lymphs Abs: 1.2 10*3/uL (ref 0.7–4.0)
MCH: 30.7 pg (ref 26.0–34.0)
MCHC: 33.2 g/dL (ref 30.0–36.0)
MCV: 92.3 fL (ref 78.0–100.0)
MONOS PCT: 7 %
Monocytes Absolute: 0.5 10*3/uL (ref 0.1–1.0)
NEUTROS ABS: 5.4 10*3/uL (ref 1.7–7.7)
NEUTROS PCT: 74 %
Platelets: 248 10*3/uL (ref 150–400)
RBC: 3.91 MIL/uL (ref 3.87–5.11)
RDW: 12.6 % (ref 11.5–15.5)
WBC: 7.2 10*3/uL (ref 4.0–10.5)

## 2015-12-10 LAB — URINALYSIS, ROUTINE W REFLEX MICROSCOPIC
Bilirubin Urine: NEGATIVE
GLUCOSE, UA: 250 mg/dL — AB
KETONES UR: 15 mg/dL — AB
Nitrite: NEGATIVE
PH: 6 (ref 5.0–8.0)
Protein, ur: NEGATIVE mg/dL
Specific Gravity, Urine: 1.019 (ref 1.005–1.030)

## 2015-12-10 LAB — BASIC METABOLIC PANEL
ANION GAP: 6 (ref 5–15)
BUN: 32 mg/dL — ABNORMAL HIGH (ref 6–20)
CALCIUM: 9.2 mg/dL (ref 8.9–10.3)
CO2: 28 mmol/L (ref 22–32)
Chloride: 101 mmol/L (ref 101–111)
Creatinine, Ser: 1.11 mg/dL — ABNORMAL HIGH (ref 0.44–1.00)
GFR, EST AFRICAN AMERICAN: 54 mL/min — AB (ref >60)
GFR, EST NON AFRICAN AMERICAN: 47 mL/min — AB (ref >60)
Glucose, Bld: 209 mg/dL — ABNORMAL HIGH (ref 65–99)
POTASSIUM: 4.1 mmol/L (ref 3.5–5.1)
Sodium: 135 mmol/L (ref 135–145)

## 2015-12-10 LAB — URINE MICROSCOPIC-ADD ON

## 2015-12-10 LAB — CBG MONITORING, ED: Glucose-Capillary: 180 mg/dL — ABNORMAL HIGH (ref 65–99)

## 2015-12-10 MED ORDER — ONDANSETRON HCL 4 MG/2ML IJ SOLN
4.0000 mg | Freq: Once | INTRAMUSCULAR | Status: AC
  Administered 2015-12-10: 4 mg via INTRAVENOUS
  Filled 2015-12-10: qty 2

## 2015-12-10 MED ORDER — HYDROMORPHONE HCL 1 MG/ML IJ SOLN
1.0000 mg | Freq: Once | INTRAMUSCULAR | Status: AC
  Administered 2015-12-10: 1 mg via INTRAVENOUS
  Filled 2015-12-10: qty 1

## 2015-12-10 MED ORDER — ONDANSETRON HCL 4 MG/2ML IJ SOLN
INTRAMUSCULAR | Status: AC
  Filled 2015-12-10: qty 2

## 2015-12-10 MED ORDER — HYDROMORPHONE HCL 1 MG/ML IJ SOLN
INTRAMUSCULAR | Status: AC
  Filled 2015-12-10: qty 1

## 2015-12-10 MED ORDER — ONDANSETRON HCL 4 MG/2ML IJ SOLN
4.0000 mg | Freq: Once | INTRAMUSCULAR | Status: AC
  Administered 2015-12-10: 4 mg via INTRAVENOUS

## 2015-12-10 MED ORDER — MORPHINE SULFATE (PF) 4 MG/ML IV SOLN
4.0000 mg | Freq: Once | INTRAVENOUS | Status: AC
  Administered 2015-12-10: 4 mg via INTRAVENOUS
  Filled 2015-12-10: qty 1

## 2015-12-10 MED ORDER — HYDROMORPHONE HCL 1 MG/ML IJ SOLN
1.0000 mg | Freq: Once | INTRAMUSCULAR | Status: AC
  Administered 2015-12-10: 1 mg via INTRAVENOUS

## 2015-12-10 NOTE — ED Notes (Signed)
Patient transported to CT 

## 2015-12-10 NOTE — ED Provider Notes (Signed)
[facility] Provider Note   CSN: [id number] Arrival date & time: 12/10/15  1533     History   Chief Complaint Chief Complaint  Patient presents with  . Hematuria    HPI Markus DaftClaudia M Arons is a 75 y.o. female.  Patient is a 75 year old female with past medical history of atrial fibrillation, diabetes, and SVT. She is currently anticoagulated with Xarelto. She presents for evaluation of right flank pain and hematuria that began yesterday and has rapidly worsened. She was treated with an antibiotic by her primary Dr. and was told to follow-up with urology if this did not help. She has an appointment for tomorrow with a urologist, however her pain significantly worsened and she presents for evaluation of this. She denies any fevers or chills. She denies any bowel complaints.   The history is provided by the patient.  Hematuria  This is a new problem. The current episode started 2 days ago. The problem occurs constantly. The problem has been gradually worsening. Associated symptoms comments: Right flank pain. Nothing aggravates the symptoms. Nothing relieves the symptoms.    Past Medical History:  Diagnosis Date  . Arthritis   . Asthma   . Atrial fibrillation (HCC)   . Cystocele   . Diabetes mellitus   . GERD (gastroesophageal reflux disease)   . High cholesterol   . Kidney stone   . Rectocele   . SVT (supraventricular tachycardia) (HCC)     There are no active problems to display for this patient.   Past Surgical History:  Procedure Laterality Date  . ABDOMINAL HYSTERECTOMY    . APPENDECTOMY    . CATARACT EXTRACTION    . CHOLECYSTECTOMY    . FOOT SURGERY    . HAND SURGERY    . ROTATOR CUFF REPAIR      OB History    No data available       Home Medications    Prior to Admission medications   Medication Sig Start Date End Date Taking? Authorizing Provider  insulin detemir (LEVEMIR) 100 UNIT/ML injection Inject into the skin at bedtime.   Yes  Historical Provider, MD  insulin lispro (HUMALOG) 100 UNIT/ML injection Inject into the skin 3 (three) times daily before meals.   Yes Historical Provider, MD  LANSOPRAZOLE PO Take by mouth.   Yes Historical Provider, MD  LORAZEPAM PO Take by mouth.   Yes Historical Provider, MD  LOSARTAN POTASSIUM-HCTZ PO Take by mouth.   Yes Historical Provider, MD  metFORMIN (GLUCOPHAGE) 500 MG tablet Take by mouth daily.   Yes Historical Provider, MD  diltiazem (CARTIA XT) 180 MG 24 hr capsule Take 180 mg by mouth daily.      Historical Provider, MD  flecainide (TAMBOCOR) 150 MG tablet Take 150 mg by mouth 2 (two) times daily.      Historical Provider, MD  Rivaroxaban (XARELTO) 20 MG TABS Take by mouth.      Historical Provider, MD  simvastatin (ZOCOR) 20 MG tablet Take 20 mg by mouth at bedtime.      Historical Provider, MD    Family History No family history on file.  Social History Social History  Substance Use Topics  . Smoking status: Never Smoker  . Smokeless tobacco: Never Used  . Alcohol use No     Allergies   Codeine; Ivp dye [iodinated diagnostic agents]; Other; Shellfish allergy; and Sulfa antibiotics   Review of Systems Review of Systems  Genitourinary: Positive for hematuria.  All other systems reviewed  and are negative.    Physical Exam Updated Vital Signs BP 111/96 (BP Location: Left Arm)   Pulse 68   Temp 98 F (36.7 C) (Oral)   Resp 18   Ht 5\' 2"  (1.575 m)   Wt 147 lb 6 oz (66.8 kg)   SpO2 100%   BMI 26.96 kg/m   Physical Exam  Constitutional: She is oriented to person, place, and time. She appears well-developed and well-nourished. No distress.  HENT:  Head: Normocephalic and atraumatic.  Neck: Normal range of motion. Neck supple.  Cardiovascular: Normal rate and regular rhythm.  Exam reveals no gallop and no friction rub.   No murmur heard. Pulmonary/Chest: Effort normal and breath sounds normal. No respiratory distress. She has no wheezes.  Abdominal:  Soft. Bowel sounds are normal. She exhibits no distension. There is no tenderness.  There is tenderness to palpation of the right flank.  Musculoskeletal: Normal range of motion.  Neurological: She is alert and oriented to person, place, and time.  Skin: Skin is warm and dry. She is not diaphoretic.  Nursing note and vitals reviewed.    ED Treatments / Results  Labs (all labs ordered are listed, but only abnormal results are displayed) Labs Reviewed  URINALYSIS, ROUTINE W REFLEX MICROSCOPIC (NOT AT [facility]) - Abnormal; Notable for the following:       Result Value   Glucose, UA 250 (*)    Hgb urine dipstick LARGE (*)    Ketones, ur 15 (*)    Leukocytes, UA SMALL (*)    All other components within normal limits  URINE MICROSCOPIC-ADD ON - Abnormal; Notable for the following:    Squamous Epithelial / LPF 0-5 (*)    Bacteria, UA RARE (*)    All other components within normal limits  BASIC METABOLIC PANEL  CBC WITH DIFFERENTIAL/PLATELET    EKG  EKG Interpretation None       Radiology No results found.  Procedures Procedures (including critical care time)  Medications Ordered in ED Medications  morphine 4 MG/ML injection 4 mg (not administered)  ondansetron (ZOFRAN) injection 4 mg (not administered)     Initial Impression / Assessment and Plan / ED Course  I have reviewed the triage vital signs and the nursing notes.  Pertinent labs & imaging results that were available during my care of the patient were reviewed by me and considered in my medical decision making (see chart for details).  Clinical Course     Workup reveals a 5 mm calculus in the right proximal ureter with moderate hydronephrosis. Patient has received 2 doses of morphine along with a dose of Dilaudid and has achieved no relief. I feel as though she will likely require admission for pain control. I've spoken with [facility] Jerral [facility] who agrees to accept the patient in transfer.  Final  Clinical Impressions(s) / ED Diagnoses   Final diagnoses:  None    New Prescriptions New Prescriptions   No medications on file     Geoffery Lyonsouglas Yousof Alderman, MD 12/10/15 Rickey [id number]

## 2015-12-10 NOTE — ED Triage Notes (Signed)
C/o hematuria x 2 days-feels she has a kidney stone-has appt with urologist tomorrow but pain is too intense to wait-NAD-steady gait

## 2015-12-10 NOTE — ED Notes (Signed)
amb to BR with one assist

## 2015-12-10 NOTE — ED Notes (Signed)
ED Provider at bedside. 

## 2016-05-26 ENCOUNTER — Other Ambulatory Visit: Payer: Self-pay | Admitting: Internal Medicine

## 2016-05-26 DIAGNOSIS — Z1231 Encounter for screening mammogram for malignant neoplasm of breast: Secondary | ICD-10-CM

## 2016-07-06 ENCOUNTER — Ambulatory Visit
Admission: RE | Admit: 2016-07-06 | Discharge: 2016-07-06 | Disposition: A | Discharge status: Home / Self Care | Payer: Medicare Other | Source: Ambulatory Visit | Attending: Internal Medicine | Admitting: Internal Medicine

## 2016-07-06 DIAGNOSIS — Z1231 Encounter for screening mammogram for malignant neoplasm of breast: Secondary | ICD-10-CM

## 2017-05-29 ENCOUNTER — Other Ambulatory Visit: Payer: Self-pay | Admitting: Internal Medicine

## 2017-05-29 DIAGNOSIS — Z1231 Encounter for screening mammogram for malignant neoplasm of breast: Secondary | ICD-10-CM

## 2017-07-07 ENCOUNTER — Ambulatory Visit
Admission: RE | Admit: 2017-07-07 | Discharge: 2017-07-07 | Disposition: A | Discharge status: Home / Self Care | Payer: Medicare Other | Source: Ambulatory Visit | Attending: Internal Medicine | Admitting: Internal Medicine

## 2017-07-07 DIAGNOSIS — Z1231 Encounter for screening mammogram for malignant neoplasm of breast: Secondary | ICD-10-CM

## 2018-05-28 ENCOUNTER — Other Ambulatory Visit: Payer: Self-pay | Admitting: Obstetrics and Gynecology

## 2018-05-28 ENCOUNTER — Other Ambulatory Visit: Payer: Self-pay | Admitting: Internal Medicine

## 2018-05-28 DIAGNOSIS — Z1231 Encounter for screening mammogram for malignant neoplasm of breast: Secondary | ICD-10-CM

## 2018-07-23 ENCOUNTER — Ambulatory Visit: Payer: Medicare Other

## 2018-08-13 ENCOUNTER — Ambulatory Visit
Admission: RE | Admit: 2018-08-13 | Discharge: 2018-08-13 | Disposition: A | Discharge status: Home / Self Care | Payer: Medicare Other | Source: Ambulatory Visit | Attending: Obstetrics and Gynecology | Admitting: Obstetrics and Gynecology

## 2018-08-13 ENCOUNTER — Other Ambulatory Visit: Payer: Self-pay

## 2018-08-13 DIAGNOSIS — Z1231 Encounter for screening mammogram for malignant neoplasm of breast: Secondary | ICD-10-CM

## 2019-07-08 ENCOUNTER — Other Ambulatory Visit: Payer: Self-pay | Admitting: Obstetrics and Gynecology

## 2019-07-08 DIAGNOSIS — Z1231 Encounter for screening mammogram for malignant neoplasm of breast: Secondary | ICD-10-CM

## 2019-08-15 ENCOUNTER — Ambulatory Visit
Admission: RE | Admit: 2019-08-15 | Discharge: 2019-08-15 | Disposition: A | Discharge status: Home / Self Care | Payer: Medicare PPO | Source: Ambulatory Visit | Attending: Obstetrics and Gynecology | Admitting: Obstetrics and Gynecology

## 2019-08-15 ENCOUNTER — Other Ambulatory Visit: Payer: Self-pay

## 2019-08-15 DIAGNOSIS — Z1231 Encounter for screening mammogram for malignant neoplasm of breast: Secondary | ICD-10-CM

## 2020-07-06 ENCOUNTER — Other Ambulatory Visit: Payer: Self-pay | Admitting: Obstetrics and Gynecology

## 2020-07-06 DIAGNOSIS — Z1231 Encounter for screening mammogram for malignant neoplasm of breast: Secondary | ICD-10-CM

## 2020-08-28 ENCOUNTER — Ambulatory Visit: Payer: Medicare PPO

## 2020-10-19 ENCOUNTER — Other Ambulatory Visit: Payer: Self-pay

## 2020-10-19 ENCOUNTER — Ambulatory Visit
Admission: RE | Admit: 2020-10-19 | Discharge: 2020-10-19 | Disposition: A | Discharge status: Home / Self Care | Payer: Medicare PPO | Source: Ambulatory Visit | Attending: Obstetrics and Gynecology | Admitting: Obstetrics and Gynecology

## 2020-10-19 DIAGNOSIS — Z1231 Encounter for screening mammogram for malignant neoplasm of breast: Secondary | ICD-10-CM

## 2020-12-10 ENCOUNTER — Other Ambulatory Visit: Payer: Self-pay

## 2020-12-10 ENCOUNTER — Emergency Department (HOSPITAL_BASED_OUTPATIENT_CLINIC_OR_DEPARTMENT_OTHER)
Admission: EM | Admit: 2020-12-10 | Discharge: 2020-12-10 | Disposition: A | Discharge status: Home / Self Care | Payer: Medicare PPO | Attending: Emergency Medicine | Admitting: Emergency Medicine

## 2020-12-10 ENCOUNTER — Encounter (HOSPITAL_BASED_OUTPATIENT_CLINIC_OR_DEPARTMENT_OTHER): Payer: Self-pay

## 2020-12-10 DIAGNOSIS — S4992XA Unspecified injury of left shoulder and upper arm, initial encounter: Secondary | ICD-10-CM | POA: Diagnosis present

## 2020-12-10 DIAGNOSIS — W228XXA Striking against or struck by other objects, initial encounter: Secondary | ICD-10-CM | POA: Insufficient documentation

## 2020-12-10 DIAGNOSIS — Z7984 Long term (current) use of oral hypoglycemic drugs: Secondary | ICD-10-CM | POA: Insufficient documentation

## 2020-12-10 DIAGNOSIS — Z23 Encounter for immunization: Secondary | ICD-10-CM | POA: Insufficient documentation

## 2020-12-10 DIAGNOSIS — E119 Type 2 diabetes mellitus without complications: Secondary | ICD-10-CM | POA: Diagnosis not present

## 2020-12-10 DIAGNOSIS — Y92007 Garden or yard of unspecified non-institutional (private) residence as the place of occurrence of the external cause: Secondary | ICD-10-CM | POA: Insufficient documentation

## 2020-12-10 DIAGNOSIS — Z7901 Long term (current) use of anticoagulants: Secondary | ICD-10-CM | POA: Insufficient documentation

## 2020-12-10 DIAGNOSIS — J45909 Unspecified asthma, uncomplicated: Secondary | ICD-10-CM | POA: Diagnosis not present

## 2020-12-10 DIAGNOSIS — S41112A Laceration without foreign body of left upper arm, initial encounter: Secondary | ICD-10-CM | POA: Insufficient documentation

## 2020-12-10 DIAGNOSIS — Y9389 Activity, other specified: Secondary | ICD-10-CM | POA: Diagnosis not present

## 2020-12-10 DIAGNOSIS — Z794 Long term (current) use of insulin: Secondary | ICD-10-CM | POA: Diagnosis not present

## 2020-12-10 MED ORDER — LIDOCAINE-EPINEPHRINE 2 %-1:200000 IJ SOLN
10.0000 mL | Freq: Once | INTRAMUSCULAR | Status: AC
Start: 2020-12-10 — End: 2020-12-10
  Administered 2020-12-10: 10 mL via INTRADERMAL
  Filled 2020-12-10: qty 20

## 2020-12-10 MED ORDER — TETANUS-DIPHTH-ACELL PERTUSSIS 5-2.5-18.5 LF-MCG/0.5 IM SUSY
0.5000 mL | PREFILLED_SYRINGE | Freq: Once | INTRAMUSCULAR | Status: AC
  Administered 2020-12-10: 0.5 mL via INTRAMUSCULAR
  Filled 2020-12-10: qty 0.5

## 2020-12-10 NOTE — Discharge Instructions (Addendum)
You had 5 stitches placed in your arm today.  Please return in the next 7 to 10 days to have these removed.  You may also get them removed at urgent care or primary care office.  Keep the area clean and dry.  You may use the ointment I gave you for the cut that we did not put sutures on.    You do not need to keep it covered after the first 1 to 2 days.  You should let the area breathe.  But please cover the area if you are outside doing yard work or other types of labor.  Information about stitches is attached to these discharge papers. It was a pleasure to meet you and I hope that you feel better!

## 2020-12-10 NOTE — ED Notes (Signed)
Dressing applied. 

## 2020-12-10 NOTE — ED Triage Notes (Signed)
Pt was blowing leaves this afternoon and was wearing gloves with a loose string that wrapped around her wrist and caused a laceration. Bleeding controlled.

## 2020-12-10 NOTE — ED Provider Notes (Signed)
[facility] EMERGENCY DEPARTMENT Provider Note   CSN: [id number] Arrival date & time: 12/10/20  1341     History Chief Complaint  Patient presents with   Laceration    Donna Wiley is a 80 y.o. female with a past medical history of atrial fibrillation on blood thinner presenting today with a laceration to her left arm.  Patient reports she was doing yard work when a piece of yarn came off and scraped her arm.  She reports that it did not bleed a lot and she was able to wash the wound and wrap it at home.  Last tetanus shot over 10 years ago.     Past Medical History:  Diagnosis Date   Arthritis    Asthma    Atrial fibrillation (HCC)    Cystocele    Diabetes mellitus    GERD (gastroesophageal reflux disease)    High cholesterol    Kidney stone    Rectocele    SVT (supraventricular tachycardia) (HCC)     There are no problems to display for this patient.   Past Surgical History:  Procedure Laterality Date   ABDOMINAL HYSTERECTOMY     APPENDECTOMY     CATARACT EXTRACTION     CHOLECYSTECTOMY     FOOT SURGERY     HAND SURGERY     ROTATOR CUFF REPAIR       OB History   No obstetric history on file.     Family History  Problem Relation Age of Onset   Breast cancer Neg Hx     Social History   Tobacco Use   Smoking status: Never   Smokeless tobacco: Never  Substance Use Topics   Alcohol use: No   Drug use: No    Home Medications Prior to Admission medications   Medication Sig Start Date End Date Taking? Authorizing Provider  b complex vitamins capsule Take 1 capsule by mouth daily.   Yes [provider]  calcium citrate (CALCITRATE - DOSED IN MG ELEMENTAL CALCIUM) 950 (200 Ca) MG tablet Take 500 mg of elemental calcium by mouth daily.   Yes [provider]  diltiazem (CARDIZEM CD) 180 MG 24 hr capsule Take 180 mg by mouth daily.     Yes [provider]  flecainide (TAMBOCOR) 150 MG tablet Take 150 mg by mouth  2 (two) times daily.     Yes [provider]  insulin lispro (HUMALOG) 100 UNIT/ML injection Inject into the skin 3 (three) times daily before meals.   Yes [provider]  LANSOPRAZOLE PO Take by mouth.   Yes [provider]  mirabegron ER (MYRBETRIQ) 50 MG TB24 tablet Take 50 mg by mouth daily.   Yes [provider]  olmesartan (BENICAR) 20 MG tablet Take 12.5 mg by mouth daily.   Yes [provider]  rivaroxaban (XARELTO) 20 MG TABS tablet Take by mouth.     Yes [provider]  simvastatin (ZOCOR) 20 MG tablet Take 20 mg by mouth at bedtime.     Yes [provider]  insulin detemir (LEVEMIR) 100 UNIT/ML injection Inject into the skin at bedtime.    [provider]  LORAZEPAM PO Take by mouth.    [provider]  LOSARTAN POTASSIUM-HCTZ PO Take by mouth.    [provider]  metFORMIN (GLUCOPHAGE) 500 MG tablet Take by mouth daily.    [provider]    Allergies    Codeine, Ivp dye [iodinated diagnostic agents],  Other, Shellfish allergy, and Sulfa antibiotics  Review of Systems   Review of Systems  Skin:  Positive for wound.   Physical Exam Updated Vital Signs BP (!) 143/68 (BP Location: Left Arm)   Pulse 85   Temp 98.6 F (37 C) (Oral)   Resp 18   Ht 5\' 2"  (1.575 m)   Wt 60.8 kg   SpO2 99%   BMI 24.51 kg/m   Physical Exam Vitals and nursing note reviewed.  Constitutional:      Appearance: Normal appearance.  HENT:     Head: Normocephalic and atraumatic.  Eyes:     General: No scleral icterus.    Conjunctiva/sclera: Conjunctivae normal.  Pulmonary:     Effort: Pulmonary effort is normal. No respiratory distress.  Skin:    Findings: No rash.     Comments: 4 cm superficial linear laceration to the left forearm.  No bleeding.  Neurological:     Mental Status: She is alert.  Psychiatric:        Mood and Affect: Mood normal.    ED Results / Procedures / Treatments    Labs (all labs ordered are listed, but only abnormal results are displayed) Labs Reviewed - No data to display  EKG None  Radiology No results found.  Procedures . Laceration Repair  Date/Time: 12/10/2020 2:40 PM Performed by: 13/10/2020, PA-C Authorized by: Saddie Benders, PA-C   Consent:    Consent obtained:  Verbal   Consent given by:  Patient   Risks, benefits, and alternatives were discussed: yes     Risks discussed:  Infection, pain and need for additional repair Universal protocol:    Patient identity confirmed:  Verbally with patient Anesthesia:    Anesthesia method:  Local infiltration   Local anesthetic:  Lidocaine 2% WITH epi Laceration details:    Location:  Shoulder/arm   Length (cm):  4 Pre-procedure details:    Preparation:  Patient was prepped and draped in usual sterile fashion Exploration:    Limited defect created (wound extended): yes     Hemostasis achieved with:  Direct pressure   Wound exploration: entire depth of wound visualized     Wound extent: no foreign bodies/material noted, no muscle damage noted, no nerve damage noted, no tendon damage noted, no underlying fracture noted and no vascular damage noted     Contaminated: no   Treatment:    Area cleansed with:  Saline   Amount of cleaning:  Extensive   Irrigation solution:  Sterile water Skin repair:    Repair method:  Sutures   Suture size:  4-0   Suture material:  Prolene   Suture technique:  Simple interrupted   Number of sutures:  5 Approximation:    Approximation:  Close Repair type:    Repair type:  Simple Post-procedure details:    Dressing: xeroform.   Procedure completion:  Tolerated well, no immediate complications   Medications Ordered in ED Medications - No data to display  ED Course  I have reviewed the triage vital signs and the nursing notes.  Pertinent labs & imaging results that were available during my care of the patient were reviewed by me and  considered in my medical decision making (see chart for details).    MDM Rules/Calculators/A&P Patient evaluated by me.  No bleeding, exposed bone or muscle.  Very simple laceration.  Repaired without difficulty by myself and PA student.  5 nonabsorbable sutures were placed.  She will return in 7 to  10 days to have these sutures removed.  Tetanus updated.  There is a linear abrasion next to the laceration that did not require sutures.  Laceration care was discussed and attached to discharge papers.  She is stable and ambulatory for discharge at this time.  Final Clinical Impression(s) / ED Diagnoses Final diagnoses:  Laceration of left upper extremity, initial encounter    Rx / DC Orders Results and diagnoses were explained to the patient. Return precautions discussed in full. Patient had no additional questions and expressed complete understanding.     Woodroe Chen 12/10/20 1549    Jacalyn Lefevre, MD 12/17/20 [phone]

## 2021-06-18 ENCOUNTER — Emergency Department (HOSPITAL_BASED_OUTPATIENT_CLINIC_OR_DEPARTMENT_OTHER)
Admission: EM | Admit: 2021-06-18 | Discharge: 2021-06-18 | Disposition: A | Discharge status: Home / Self Care | Payer: Medicare PPO | Attending: Emergency Medicine | Admitting: Emergency Medicine

## 2021-06-18 ENCOUNTER — Encounter (HOSPITAL_BASED_OUTPATIENT_CLINIC_OR_DEPARTMENT_OTHER): Payer: Self-pay | Admitting: Emergency Medicine

## 2021-06-18 ENCOUNTER — Other Ambulatory Visit: Payer: Self-pay

## 2021-06-18 DIAGNOSIS — R220 Localized swelling, mass and lump, head: Secondary | ICD-10-CM | POA: Diagnosis present

## 2021-06-18 DIAGNOSIS — Z7901 Long term (current) use of anticoagulants: Secondary | ICD-10-CM | POA: Diagnosis not present

## 2021-06-18 DIAGNOSIS — Z794 Long term (current) use of insulin: Secondary | ICD-10-CM | POA: Diagnosis not present

## 2021-06-18 MED ORDER — DEXAMETHASONE 4 MG PO TABS
10.0000 mg | ORAL_TABLET | Freq: Once | ORAL | Status: AC
  Administered 2021-06-18: 10 mg via ORAL
  Filled 2021-06-18: qty 3

## 2021-06-18 NOTE — ED Provider Notes (Signed)
[facility]GENC[facility] DEPARTMENT Provider Note   CSN: [id number] Arrival date & time: 06/18/21  1257     History  Chief Complaint  Patient presents with   Allergic Reaction    Jacquilyn Benoit is a 81 y.o. female.  81 yo F with a chief complaints of possible allergic reaction.  The patient noticed that her face was swollen this morning when she woke up.  She denies any new products applied to her face.  Also noted it to the base of her neck.  She does not use soap to her face commonly.  No make-up or perfume.  She denies any difficulty breathing or swallowing denies any nausea vomiting or diarrhea.   Allergic Reaction     Home Medications Prior to Admission medications   Medication Sig Start Date End Date Taking? Authorizing Provider  b complex vitamins capsule Take 1 capsule by mouth daily.    [provider]  calcium citrate (CALCITRATE - DOSED IN MG ELEMENTAL CALCIUM) 950 (200 Ca) MG tablet Take 500 mg of elemental calcium by mouth daily.    [provider]  diltiazem (CARDIZEM CD) 180 MG 24 hr capsule Take 180 mg by mouth daily.      [provider]  flecainide (TAMBOCOR) 150 MG tablet Take 150 mg by mouth 2 (two) times daily.      [provider]  insulin detemir (LEVEMIR) 100 UNIT/ML injection Inject into the skin at bedtime.    [provider]  insulin lispro (HUMALOG) 100 UNIT/ML injection Inject into the skin 3 (three) times daily before meals.    [provider]  LANSOPRAZOLE PO Take by mouth.    [provider]  LORAZEPAM PO Take by mouth.    [provider]  LOSARTAN POTASSIUM-HCTZ PO Take by mouth.    [provider]  metFORMIN (GLUCOPHAGE) 500 MG tablet Take by mouth daily.    [provider]  mirabegron ER (MYRBETRIQ) 50 MG TB24 tablet Take 50 mg by mouth daily.    [provider]  olmesartan (BENICAR) 20 MG tablet Take 12.5 mg by mouth daily.     [provider]  rivaroxaban (XARELTO) 20 MG TABS tablet Take by mouth.      [provider]  simvastatin (ZOCOR) 20 MG tablet Take 20 mg by mouth at bedtime.      [provider]      Allergies    Codeine, Ivp dye [iodinated contrast media], Other, Shellfish allergy, and Sulfa antibiotics    Review of Systems   Review of Systems  Physical Exam Updated Vital Signs BP (!) 145/72 (BP Location: Right Arm)   Pulse 85   Temp 98.5 F (36.9 C) (Oral)   Resp 16   Ht 5\' 2"  (1.575 m)   Wt 61.2 kg   SpO2 98%   BMI 24.69 kg/m  Physical Exam Vitals and nursing note reviewed.  Constitutional:      General: She is not in acute distress.    Appearance: She is well-developed. She is not diaphoretic.  HENT:     Head: Normocephalic and atraumatic.     Comments: Mild erythema to the face and the base of the neck.  No other noted lesions.  No erythema or rash to the abdomen.  She does have some scratches and some vesicular lesions to the upper extremities. Eyes:     Pupils: Pupils are equal, round, and reactive to light.  Cardiovascular:  Rate and Rhythm: Normal rate and regular rhythm.     Heart sounds: No murmur heard.   No friction rub. No gallop.  Pulmonary:     Effort: Pulmonary effort is normal.     Breath sounds: No wheezing or rales.  Abdominal:     General: There is no distension.     Palpations: Abdomen is soft.     Tenderness: There is no abdominal tenderness.  Musculoskeletal:        General: No tenderness.     Cervical back: Normal range of motion and neck supple.  Skin:    General: Skin is warm and dry.  Neurological:     Mental Status: She is alert and oriented to person, place, and time.  Psychiatric:        Behavior: Behavior normal.    ED Results / Procedures / Treatments   Labs (all labs ordered are listed, but only abnormal results are displayed) Labs Reviewed - No data to display  EKG None  Radiology No results  found.  Procedures Procedures    Medications Ordered in ED Medications  dexamethasone (DECADRON) tablet 10 mg (has no administration in time range)    ED Course/ Medical Decision Making/ A&P                           Medical Decision Making Risk Prescription drug management.   81 yo  F with a chief complaints of possible allergic reaction.  The patient could have a contact dermatitis from poison ivy as she was gardening this past week and has scratches and some signs of contact dermatitis to the arms.  No obvious linear lesions.  No obvious lesions to the face more generalized erythema.  Other possibilities the patient has a intolerance to her lotion that she applies to her face that is newly developed.  She has no other signs or symptoms of anaphylaxis.  We will give a dose of Decadron here.  We will have her take antihistamine at home.  PCP follow-up  1:36 PM:  I have discussed the diagnosis/risks/treatment options with the patient.  Evaluation and diagnostic testing in the emergency department does not suggest an emergent condition requiring admission or immediate intervention beyond what has been performed at this time.  They will follow up with  PCP. We also discussed returning to the ED immediately if new or worsening sx occur. We discussed the sx which are most concerning (e.g., sudden worsening pain, fever, inability to tolerate by mouth) that necessitate immediate return. Medications administered to the patient during their visit and any new prescriptions provided to the patient are listed below.  Medications given during this visit Medications  dexamethasone (DECADRON) tablet 10 mg (has no administration in time range)     The patient appears reasonably screen and/or stabilized for discharge and I doubt any other medical condition or other [facility] requiring further screening, evaluation, or treatment in the ED at this time prior to discharge.          Final Clinical  Impression(s) / ED Diagnoses Final diagnoses:  Facial swelling    Rx / DC Orders ED Discharge Orders     None         Deno Etienne, DO 06/18/21 1336

## 2021-06-18 NOTE — Discharge Instructions (Signed)
Try to avoid any lotions on your face.  Please follow-up with your family doctor and allergist.  You can take an antihistamine as well to try and help you with the itching.  Typically I would suggest one of the nonsedating ones like Claritin and Zyrtec Allegra or Xyzal.

## 2021-06-18 NOTE — ED Triage Notes (Addendum)
Woke up this am with swollen face , eyes rash  on neck , denies sob and pt is handling secreations well has had salmon for dinner  but allergic to shellfish took 25 mg benadryl at 0930

## 2021-07-05 ENCOUNTER — Ambulatory Visit: Payer: Medicare PPO | Admitting: Internal Medicine

## 2021-07-05 ENCOUNTER — Encounter: Payer: Self-pay | Admitting: Internal Medicine

## 2021-07-05 VITALS — BP 98/60 | Ht 62.0 in | Wt 136.0 lb

## 2021-07-05 DIAGNOSIS — H1010 Acute atopic conjunctivitis, unspecified eye: Secondary | ICD-10-CM

## 2021-07-05 DIAGNOSIS — J302 Other seasonal allergic rhinitis: Secondary | ICD-10-CM

## 2021-07-05 DIAGNOSIS — L237 Allergic contact dermatitis due to plants, except food: Secondary | ICD-10-CM

## 2021-07-05 DIAGNOSIS — H1013 Acute atopic conjunctivitis, bilateral: Secondary | ICD-10-CM

## 2021-07-05 DIAGNOSIS — J3089 Other allergic rhinitis: Secondary | ICD-10-CM

## 2021-07-05 DIAGNOSIS — T7800XA Anaphylactic reaction due to unspecified food, initial encounter: Secondary | ICD-10-CM | POA: Diagnosis not present

## 2021-07-05 MED ORDER — TRIAMCINOLONE ACETONIDE 0.1 % EX OINT: TOPICAL_OINTMENT | CUTANEOUS | 1 refills | Status: DC

## 2021-07-05 NOTE — Patient Instructions (Signed)
Contact Dermatitis  -Best treatment for this is prevention with gloves, protective clothing when working outdoors -If it recurs start triamcinolone 0.1% ointment twice a day, you may need prednisone again in the future if the topical steroid does not work  Seasonal and perennial rhinitis: Moderately well controlled  - Testing today showed positive to grass, weed, trees, molds, dust mite and mouse  - Copy of test results provided.  - Continue avoidance measures  - Stop taking: Benadryl as this has side effects - Start taking: Zyrtec (cetirizine) 10mg  tablet once daily - You can use an extra dose of the antihistamine, if needed, for breakthrough symptoms.  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 - If using Epinephrine autoinjector, call 911 - A food allergy action plan has been provided and discussed. - Medic Alert identification is recommended.  Follow up: 3 months   Thank you so much for letting me partake in your care today.  Don't hesitate to reach out if you have any additional concerns!  , MD  [facility] [place]

## 2021-07-05 NOTE — Progress Notes (Signed)
New Patient Note  RE: Donna Wiley: [id number] DOB: 02/18/1940 Date of Office Visit: 07/05/2021  Consult requested by: Forrest Moronuehle, Stephen, MD Primary care provider: Forrest Moronuehle, Stephen, MD  Chief Complaint: Allergic Reaction (04/19 woke up facial swelling and hives. Went to ER. )  History of Present Illness: I had the pleasure of seeing Donna Wiley for initial evaluation at the [facility] on 07/05/2021. She is a 81 y.o. female, who is referred here by Forrest Moronuehle, Stephen, MD for the evaluation of food allergy and allergic reactions .  History obtained from patient.  In April she reports doing yard work the day prior and woke up with facial swelling and erythematous pruritic patches on chest and face..  She presented to the ED and was treated with decadron.  Symptoms lasted  approximately 1 week. She was also taking benadryl for itching.  Pictures are consistent with a contact dermatitis  Concern for Food Allergy:  Shrimp - tongue swelling  Tree nut - testing positive  Cherry - testing positive  Melon - testing positive  Strawberry - testing positive   She has gradually introduced TN, fruits, and strawberries without issue.  Still avoiding shellfish  Previous allergy testing yes - TN, shellfish, Cherry and Melon  Eats egg, dairy, wheat, soy, fish, peanuts, sesame without reactions Carries an epinephrine autoinjector: yes Has food allergy action plan yes  Also has a history of allergic rhinitis, previously on Ait for 10 years.  Still gets sneezing, itchy watery eyes.  Clears in the winter.  Currently treating with Benadryl as needed.  Has not tried no sprays or other second-generation antihistamines.  Denies any animal triggers or nonallergic triggers.   Assessment and Plan: Donna Wiley is a 81 y.o. female with: Allergic contact dermatitis due to plants, except food  Allergy with anaphylaxis due to food - Plan: Allergy Test, Allergen Profile,  Shellfish  Seasonal and perennial allergic rhinitis - Plan: Allergy Test  Seasonal allergic conjunctivitis - Plan: Allergy Test Plan: Patient Instructions   Contact Dermatitis  -Best treatment for this is prevention with gloves, protective clothing when working outdoors -If it recurs start triamcinolone 0.1% ointment twice a day, you may need prednisone again in the future if the topical steroid does not work  Seasonal and perennial rhinitis: Moderately well controlled  - Testing today showed positive to grass, weed, trees, molds, dust mite and mouse  - Copy of test results provided.  - Continue avoidance measures  - Stop taking: Benadryl as this has side effects - Start taking: Zyrtec (cetirizine) 10mg  tablet once daily - You can use an extra dose of the antihistamine, if needed, for breakthrough symptoms.  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 - If using Epinephrine autoinjector, call 911 - A food allergy action plan has been provided and discussed. - Medic  Alert identification is recommended.  Follow up: 3 months   Thank you so much for letting me partake in your care today.  Don't hesitate to reach out if you have any additional concerns!  Ferol Luz, MD  [facility] [place] [place]    No  follow-ups on file.  Meds ordered this encounter  Medications   triamcinolone ointment (KENALOG) 0.1 %    Sig: Apply sparingly to affected areas twice daily as needed below face and neck    Dispense:  45 g    Refill:  1   Lab Orders         Allergen Profile, Shellfish      Other allergy screening: Asthma: no Rhino conjunctivitis: yes, previously on AIT for 10 years  Food allergy: yes Medication allergy: no Hymenoptera allergy: no Urticaria: no Eczema:no History of recurrent infections suggestive of immunodeficency: no  Diagnostics:  Skin Testing: Environmental allergy panel and select foods. To grass, weed, trees, mold, dust mite, mouse, tree nuts Negative to strawberry, melon, shellfish Results interpreted by myself and discussed with patient/family.  Airborne Adult Perc - 07/05/21 1114     Time Antigen Placed 1114    Allergen Manufacturer Waynette Buttery    Location Back    Number of Test 59    1. Control-Buffer 50% Glycerol Negative    2. Control-Histamine 1 mg/ml 4+    3. Albumin saline Negative    4. Bahia 4+    5. French [place] 4+    6. Johnson Negative    7. Kentucky Blue 4+    8. Meadow Fescue 4+    9. Perennial Rye 4+    10. Sweet Vernal 4+    11. Timothy 4+    12. Cocklebur 4+    13. Burweed Marshelder 3+    14. Ragweed, short Negative    15. Ragweed, Giant 3+    16. Plantain,  English 4+    17. Lamb's Quarters Negative    18. Sheep Sorrell Negative    19. Rough Pigweed 3+    20. Marsh Elder, Rough 3+    21. Mugwort, Common Negative    22. Ash mix Negative    23. Birch mix 4+    24. Beech American 4+    25. Box, Elder 4+    26. Cedar, red Negative    27. Cottonwood, Eastern 3+    28. Elm mix 3+    29. Hickory 4+    30. Maple mix 4+    31. Oak, [place] mix 4+    32. Pecan Pollen 4+    33. Pine mix 4+    34. Sycamore Eastern 4+    35. Walnut, Black Pollen 3+    36. Alternaria alternata 4+    37. Cladosporium Herbarum Negative    38. Aspergillus mix 4+     39. Penicillium mix Negative    40. Bipolaris sorokiniana (Helminthosporium) Negative    41. Drechslera spicifera (Curvularia) 4+    42. Mucor plumbeus Negative    43. Fusarium moniliforme 4+    44. Aureobasidium pullulans (pullulara) 4+    45. Rhizopus oryzae 3+    46. Botrytis cinera Negative    47. Epicoccum nigrum Negative    48. Phoma betae Negative    49. Candida Albicans Negative    50. Trichophyton mentagrophytes 4+    51. Mite, D Farinae  5,000 AU/ml 3+    52. Mite, D Pteronyssinus  5,000 AU/ml 3+  53. Cat Hair 10,000 BAU/ml Negative    54.  Dog Epithelia Negative    55. Mixed Feathers Negative    56. Horse Epithelia Negative    57. Cockroach, German Negative    58. Mouse 3+    59. Tobacco Leaf Negative             Food Adult Perc - 07/05/21 1100     Time Antigen Placed 1114    Allergen Manufacturer Waynette Buttery    Location Back    Number of allergen test 16    8. Shellfish Mix Negative    10. Cashew Negative    11. Pecan Food Negative    12. Walnut Food 4+    13. Almond 4+    14. Hazelnut 4+    60. Strawberry Negative    61. Cantaloupe Negative    62. Watermelon Negative             Past Medical History: There are no problems to display for this patient.  Past Medical History:  Diagnosis Date   Arthritis    Asthma    childhood   Atrial fibrillation (HCC)    Cystocele    Diabetes mellitus    GERD (gastroesophageal reflux disease)    High cholesterol    Kidney stone    Rectocele    SVT (supraventricular tachycardia) (HCC)    Past Surgical History: Past Surgical History:  Procedure Laterality Date   ABDOMINAL HYSTERECTOMY     APPENDECTOMY     CATARACT EXTRACTION     CHOLECYSTECTOMY     FOOT SURGERY     HAND SURGERY     ROTATOR CUFF REPAIR     Medication List:  Current Outpatient Medications  Medication Sig Dispense Refill   b complex vitamins capsule Take 1 capsule by mouth daily.     calcium citrate (CALCITRATE - DOSED IN MG  ELEMENTAL CALCIUM) 950 (200 Ca) MG tablet Take 500 mg of elemental calcium by mouth daily.     diltiazem (CARDIZEM CD) 180 MG 24 hr capsule Take 180 mg by mouth daily.       EPINEPHrine 0.3 mg/0.3 mL IJ SOAJ injection Inject into the muscle.     flecainide (TAMBOCOR) 150 MG tablet Take 50 mg by mouth 2 (two) times daily.     insulin detemir (LEVEMIR) 100 UNIT/ML injection Inject into the skin at bedtime.     insulin lispro (HUMALOG) 100 UNIT/ML injection Inject into the skin 3 (three) times daily before meals.     LORazepam (ATIVAN) 2 MG tablet Take 2 mg by mouth at bedtime as needed.     LORAZEPAM PO Take 2 mg by mouth.     olmesartan (BENICAR) 20 MG tablet Take 12.5 mg by mouth daily.     pantoprazole (PROTONIX) 40 MG tablet Take 40 mg by mouth daily.     rivaroxaban (XARELTO) 20 MG TABS tablet Take by mouth.       simvastatin (ZOCOR) 20 MG tablet Take 20 mg by mouth at bedtime.       tolterodine (DETROL LA) 4 MG 24 hr capsule Take 4 mg by mouth daily.     triamcinolone ointment (KENALOG) 0.1 % Apply sparingly to affected areas twice daily as needed below face and neck 45 g 1   LOSARTAN POTASSIUM-HCTZ PO Take by mouth. (Patient not taking: Reported on 07/05/2021)     metFORMIN (GLUCOPHAGE) 500 MG tablet Take by mouth daily. (Patient not taking: Reported on 07/05/2021)  mirabegron ER (MYRBETRIQ) 50 MG TB24 tablet Take 50 mg by mouth daily. (Patient not taking: Reported on 07/05/2021)     No current facility-administered medications for this visit.   Allergies: Allergies  Allergen Reactions   Iodine Anaphylaxis   Nickel Rash   Charentais Melon (French Melon)    Cherry Swelling   Codeine    Ivp Dye [Iodinated Contrast Media]    Other     Tree nuts    Shellfish Allergy    Sulfa Antibiotics    Tape Dermatitis    Paper tape is best PAPER TAPE    Social History: Social History   Socioeconomic History   Marital status: Married    Spouse name: Not on file   Number of children: Not  on file   Years of education: Not on file   Highest education level: Not on file  Occupational History   Not on file  Tobacco Use   Smoking status: Never    Passive exposure: Never   Smokeless tobacco: Never  Vaping Use   Vaping Use: Never used  Substance and Sexual Activity   Alcohol use: No   Drug use: No   Sexual activity: Not on file  Other Topics Concern   Not on file  Social History Narrative   Not on file   Social Determinants of Health   Financial Resource Strain: Not on file  Food Insecurity: Not on file  Transportation Needs: Not on file  Physical Activity: Not on file  Stress: Not on file  Social Connections: Not on file   Lives in a single-family home that is least 82 years old.  There are no roaches in the house and bed is 2 feet off the floor.  She does not use dust mite precautions, has not exposed to fumes, chemicals or dust and does not live near an interstate or industrial area.. Smoking: Never smoker Occupation: Retired  Landscape architect HistorySurveyor, minerals in the house: no Engineer, civil (consulting) in the family room: yes Carpet in the bedroom: yes Heating: heat pump Cooling: heat pump Pet: no  Family History: Family History  Problem Relation Age of Onset   Allergic rhinitis Daughter    Asthma Daughter    Lupus Daughter    Scoliosis Daughter    Breast cancer Neg Hx    Urticaria Neg Hx    Immunodeficiency Neg Hx    Angioedema Neg Hx    Atopy Neg Hx    Eczema Neg Hx      ROS: All others negative except as noted per HPI.   Objective: BP 98/60   Ht  (1.575 m)   Wt 136 lb (61.7 kg)   BMI 24.87 kg/m  Body mass index is 24.87 kg/m.  General Appearance:  Alert, cooperative, no distress, appears stated age  Head:  Normocephalic, without obvious abnormality, atraumatic  Eyes:  Conjunctiva clear, EOM's intact  Nose: Nares normal, normal mucosa, no visible anterior polyps, and septum midline  Throat: Lips, tongue normal; teeth and gums normal,  normal posterior oropharynx and no tonsillar exudate  Neck: Supple, symmetrical  Lungs:   clear to auscultation bilaterally, Respirations unlabored, no coughing  Heart:  regular rate and rhythm and no murmur, Appears well perfused  Extremities: No edema  Skin: Skin color, texture, turgor normal, no rashes or lesions on visualized portions of skin  Neurologic: No gross deficits   The plan was reviewed with the patient/family, and all questions/concerned were addressed.  It was my pleasure to  see Yarianna today and participate in her care. Please feel free to contact me with any questions or concerns.  Sincerely,  Ferol Luz, MD Allergy & Immunology  [facility] office: [phone] [facility] office: [phone]

## 2021-07-09 LAB — ALLERGEN PROFILE, SHELLFISH
Clam IgE: <0.1 kU/L
F023-IgE Crab: <0.1 kU/L
F080-IgE Lobster: <0.1 kU/L
F290-IgE Oyster: <0.1 kU/L
Scallop IgE: <0.1 kU/L
Shrimp IgE: <0.1 kU/L

## 2021-07-09 NOTE — Progress Notes (Signed)
Blood work returned negative to shellfish.  If she is interested in reintroducing shellfish we could consider an oral food challenge in clinic.  I recommend starting with shrimp.  Please let patient know.  Thanks! EML

## 2021-09-13 ENCOUNTER — Other Ambulatory Visit: Payer: Self-pay | Admitting: Obstetrics and Gynecology

## 2021-09-13 DIAGNOSIS — Z1231 Encounter for screening mammogram for malignant neoplasm of breast: Secondary | ICD-10-CM

## 2021-10-05 ENCOUNTER — Ambulatory Visit (INDEPENDENT_AMBULATORY_CARE_PROVIDER_SITE_OTHER): Payer: Medicare PPO | Admitting: Internal Medicine

## 2021-10-05 ENCOUNTER — Encounter: Payer: Self-pay | Admitting: Internal Medicine

## 2021-10-05 VITALS — BP 134/66 | HR 79 | Temp 98.0°F | Resp 17

## 2021-10-05 DIAGNOSIS — T7800XD Anaphylactic reaction due to unspecified food, subsequent encounter: Secondary | ICD-10-CM

## 2021-10-05 DIAGNOSIS — L2389 Allergic contact dermatitis due to other agents: Secondary | ICD-10-CM | POA: Diagnosis not present

## 2021-10-05 DIAGNOSIS — J3089 Other allergic rhinitis: Secondary | ICD-10-CM

## 2021-10-05 DIAGNOSIS — J302 Other seasonal allergic rhinitis: Secondary | ICD-10-CM

## 2021-10-05 DIAGNOSIS — T7800XA Anaphylactic reaction due to unspecified food, initial encounter: Secondary | ICD-10-CM

## 2021-10-05 MED ORDER — FLUTICASONE PROPIONATE 50 MCG/ACT NA SUSP
1.0000 | Freq: Every day | NASAL | 2 refills | Status: DC
Start: 2021-10-05 — End: 2022-04-05

## 2021-10-05 MED ORDER — LEVOCETIRIZINE DIHYDROCHLORIDE 5 MG PO TABS
5.0000 mg | ORAL_TABLET | Freq: Every evening | ORAL | 1 refills | Status: DC
Start: 2021-10-05 — End: 2023-01-10

## 2021-10-05 NOTE — Patient Instructions (Addendum)
   Contact Dermatitis  -Continue avoidance measures with with gloves, protective clothing when working outdoors -If it recurs start triamcinolone 0.1% ointment twice a day, you may need prednisone again in the future if the topical steroid does not work  Seasonal and perennial rhinitis: Moderately well controlled  - 07/05/21:  positive to grass, weed, trees, molds, dust mite and mouse  - Continue avoidance measures  - Continue  taking: Xyzal (levocetirizine) 5mg  tablet once daily - Start taking flonase 1 spray per nostril prior to gardening  - You can use an extra dose of the antihistamine, if needed, for breakthrough symptoms.  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 - If using Epinephrine autoinjector, call 911 - A food allergy action plan has been provided and discussed. - Medic Alert identification is recommended.  Follow up: 6 months   Thank you so much for letting me partake in your care today.  Don't hesitate to reach out if you have any additional concerns!  09/04/21, MD  [facility] [place] [place]

## 2021-10-05 NOTE — Progress Notes (Signed)
Follow Up Note  RE: Donna Wiley MRN: [id number] DOB: [birth year] Date of Office Visit: 10/05/2021  Referring provider: Forrest Moron, MD Primary care provider: Forrest Moron, MD  Chief Complaint: Follow-up (Pt states she's been doing well, when she work in her flower garden she tends to have stuffy/runny nose. She usual would take some benadryl and most times symptoms will reserved itself.) and Allergic Rhinitis   History of Present Illness: I had the pleasure of seeing Donna Wiley for a follow up visit at the [facility] on 10/05/2021. She is a 81 y.o. female, who is being followed for contact dermatitis, allergic rhinitis, food allergy. Her previous allergy office visit was on 07/05/21 with Dr. Marlynn Perking. Today is a regular follow up visit.  History obtained from patient, chart review .  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  However patient is not interested in reintroduction at this time.    Allergic rhinitis: current therapy: previously on zyrtec 10mg  daily, xyzal  as needed and benadryl.  She is having some issues with affording brand name zyrtec and is interested in antihistamines covered by insurance.   symptoms  mild sneezing, rhinnorhea with outdoor exposure in garden.   symptoms include: rhinorrhea and sneezing Previous allergy testing:  07/05/2021:positive to grass, weed, trees, molds, dust mite and mouse  History of reflux/heartburn: no Interested in Allergy Immunotherapy:  previously on Ait for 10 years  Denies any recurrence of contact dermatitis.    Assessment and Plan: Karolyne is a 81 y.o. female with: Allergic contact dermatitis due to other agents  Seasonal and perennial allergic rhinitis  Allergy with anaphylaxis due to  food Plan: Patient Instructions    Contact Dermatitis  -Continue avoidance measures with with gloves, protective clothing when working outdoors -If it recurs start triamcinolone 0.1% ointment twice a day, you may need prednisone again in the future if the topical steroid does not work  Seasonal and perennial rhinitis: Moderately well controlled  - 07/05/21:  positive to grass, weed, trees, molds, dust mite and mouse  - Continue avoidance measures  - Continue  taking: Xyzal (levocetirizine) 5mg  tablet once daily - Start taking flonase 1 spray per nostril prior to gardening  - You can use an extra dose of the antihistamine, if needed, for breakthrough symptoms.  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 - If using Epinephrine autoinjector, call 911 - A food allergy action plan has been provided and discussed. - Medic Alert identification is recommended.  Follow up: 6 months   Thank you so much for letting me partake in your care today.  Don't  hesitate to reach out if you have any additional concerns!  Roney Marion, MD  [facility] [place] [place]   No follow-ups on file.  Meds ordered this encounter  Medications   levocetirizine (XYZAL) 5 MG tablet    Sig: Take 1 tablet (5 mg total) by  mouth every evening.    Dispense:  30 tablet    Refill:  1   fluticasone (FLONASE) 50 MCG/ACT nasal spray    Sig: Place 1 spray into both nostrils daily.    Dispense:  16 g    Refill:  2    Lab Orders  No laboratory test(s) ordered today   Diagnostics: None performed    Medication List:  Current Outpatient Medications  Medication Sig Dispense Refill   b complex vitamins capsule Take 1 capsule by mouth daily.     calcium citrate (CALCITRATE - DOSED IN MG ELEMENTAL CALCIUM) 950 (200 Ca) MG tablet Take 500 mg of elemental calcium by mouth daily.     cetirizine (ZYRTEC) 10 MG tablet Take 1 tablet by mouth daily.     clobetasol cream (TEMOVATE) 0.05 % Apply topically 2 (two) times daily.     diltiazem (CARDIZEM CD) 180 MG 24 hr capsule Take 180 mg by mouth daily.       EPINEPHrine 0.3 mg/0.3 mL IJ SOAJ injection Inject into the muscle.     fluticasone (FLONASE) 50 MCG/ACT nasal spray Place 1 spray into both nostrils daily. 16 g 2   hydrOXYzine (ATARAX) 10 MG tablet Take 10 mg by mouth 4 (four) times daily.     insulin lispro (HUMALOG) 100 UNIT/ML injection USE VIA INSULIN PUMP. MAXIMUM DAILY DOSE IS 100 UNITS     levocetirizine (XYZAL) 5 MG tablet Take 1 tablet (5 mg total) by mouth every evening. 30 tablet 1   losartan-hydrochlorothiazide (HYZAAR) 100-12.5 MG tablet losartan 100 mg-hydrochlorothiazide 12.5 mg tablet     losartan-hydrochlorothiazide (HYZAAR) 50-12.5 MG tablet Take 1 tablet by mouth daily.     mirabegron ER (MYRBETRIQ) 50 MG TB24 tablet Take 1 tablet by mouth daily.     olmesartan-hydrochlorothiazide (BENICAR HCT) 40-12.5 MG tablet Take 1 tablet by mouth daily.     ONETOUCH ULTRA test strip USE TO CHECK BLOOD SUGAR 7 TIMES DAILY     pantoprazole (PROTONIX) 40 MG tablet Take 40 mg by mouth daily.     rivaroxaban (XARELTO) 20 MG TABS tablet Take by mouth.       simvastatin (ZOCOR) 20 MG tablet Take 20 mg by mouth at bedtime.       simvastatin (ZOCOR) 40 MG tablet Take  0.5 tablets by mouth daily.     betamethasone dipropionate 0.05 % cream betamethasone dipropionate 0.05 % topical cream  APP EXT AA BID FOR UP TO 2 WKS (Patient not taking: Reported on 10/05/2021)     flecainide (TAMBOCOR) 150 MG tablet Take 50 mg by mouth 2 (two) times daily. (Patient not taking: Reported on 10/05/2021)     flecainide (TAMBOCOR) 50 MG tablet TAKE 1 TABLET BY MOUTH EVERY MORNING THEN TAKE 2 TABLETS BY MOUTH EVERY EVENING (Patient not taking: Reported on 10/05/2021)     insulin detemir (LEVEMIR) 100 UNIT/ML injection Inject into the skin at bedtime. (Patient not taking: Reported on 10/05/2021)     insulin detemir (LEVEMIR) 100 UNIT/ML injection Inject into the skin. (Patient not taking: Reported on 10/05/2021)     Insulin Disposable Pump (OMNIPOD DASH PODS, GEN 4,) MISC Inject into the skin. (Patient  not taking: Reported on 10/05/2021)     Insulin Disposable Pump (OMNIPOD DASH PODS, GEN 4,) MISC CHANGE EVERY 48 HOURS (Patient not taking: Reported on 10/05/2021)     insulin lispro (HUMALOG) 100 UNIT/ML injection Inject into the skin 3 (three) times daily before meals.     insulin lispro (HUMALOG) 100 UNIT/ML injection Humalog U-100 Insulin 100 unit/mL subcutaneous solution     LORazepam (ATIVAN) 2 MG tablet Take 2 mg by mouth at bedtime as needed. (Patient not taking: Reported on 10/05/2021)     LORazepam (ATIVAN) 2 MG tablet Take 1 tablet by mouth at bedtime as needed. (Patient not taking: Reported on 10/05/2021)     LORAZEPAM PO Take 2 mg by mouth. (Patient not taking: Reported on 10/05/2021)     LOSARTAN POTASSIUM-HCTZ PO Take by mouth. (Patient not taking: Reported on 07/05/2021)     metFORMIN (GLUCOPHAGE) 500 MG tablet Take by mouth daily. (Patient not taking: Reported on 07/05/2021)     mirabegron ER (MYRBETRIQ) 50 MG TB24 tablet Take 50 mg by mouth daily. (Patient not taking: Reported on 07/05/2021)     mupirocin ointment (BACTROBAN) 2 % mupirocin 2 % topical ointment  APPLY AA TID UNTIL CLEAR (Patient  not taking: Reported on 10/05/2021)     olmesartan (BENICAR) 20 MG tablet Take 12.5 mg by mouth daily. (Patient not taking: Reported on 10/05/2021)     tolterodine (DETROL LA) 4 MG 24 hr capsule Take 4 mg by mouth daily. (Patient not taking: Reported on 10/05/2021)     triamcinolone cream (KENALOG) 0.1 % Apply topically 3 (three) times daily. (Patient not taking: Reported on 10/05/2021)     triamcinolone ointment (KENALOG) 0.1 % Apply sparingly to affected areas twice daily as needed below face and neck (Patient not taking: Reported on 10/05/2021) 45 g 1   No current facility-administered medications for this visit.   Allergies: Allergies  Allergen Reactions   Iodine Anaphylaxis   Nickel Rash   Charentais Melon (French Melon)    Cherry Swelling   Codeine    Ivp Dye [Iodinated Contrast Media]    Other     Tree nuts    Shellfish Allergy    Sulfa Antibiotics    Tape Dermatitis    Paper tape is best PAPER TAPE    I reviewed her past medical history, social history, family history, and environmental history and no significant changes have been reported from her previous visit.  ROS: All others negative except as noted per HPI.   Objective: BP 134/66   Pulse 79   Temp 98 F (36.7 C) (Temporal)   Resp 17   SpO2 98%  There is no height or weight on file to calculate BMI. General Appearance:  Alert, cooperative, no distress, appears stated age  Head:  Normocephalic, without obvious abnormality, atraumatic  Eyes:  Conjunctiva clear, EOM's intact  Nose: Nares normal, hypertrophic turbinates, no visible anterior polyps, and septum midline  Throat: Lips, tongue normal; teeth and gums normal, normal posterior oropharynx  Neck: Supple, symmetrical  Lungs:   clear to auscultation bilaterally, Respirations unlabored, no coughing  Heart:  regular rate and rhythm and no murmur, Appears well perfused  Extremities: No edema  Skin: Skin color, texture, turgor normal, no rashes or lesions on  visualized portions of skin   Neurologic: No gross deficits   Previous notes and tests were reviewed. The plan was reviewed with the patient/family, and all questions/concerned were addressed.  It was my pleasure to see Eme  today and participate in her care. Please feel free to contact me with any questions or concerns.  Sincerely,  Ferol Luz, MD  Allergy & Immunology  [facility] Office: [phone]

## 2021-10-20 ENCOUNTER — Ambulatory Visit: Payer: Medicare PPO

## 2021-11-04 ENCOUNTER — Ambulatory Visit: Payer: Medicare PPO

## 2021-11-04 ENCOUNTER — Ambulatory Visit
Admission: RE | Admit: 2021-11-04 | Discharge: 2021-11-04 | Disposition: A | Discharge status: Home / Self Care | Payer: Medicare PPO | Source: Ambulatory Visit | Attending: Obstetrics and Gynecology | Admitting: Obstetrics and Gynecology

## 2021-11-04 DIAGNOSIS — Z1231 Encounter for screening mammogram for malignant neoplasm of breast: Secondary | ICD-10-CM

## 2022-04-05 ENCOUNTER — Ambulatory Visit: Payer: Medicare PPO | Admitting: Internal Medicine

## 2022-04-05 ENCOUNTER — Encounter: Payer: Self-pay | Admitting: Internal Medicine

## 2022-04-05 VITALS — BP 120/60 | HR 76 | Temp 98.1°F | Resp 17 | Wt 139.0 lb

## 2022-04-05 DIAGNOSIS — T7800XD Anaphylactic reaction due to unspecified food, subsequent encounter: Secondary | ICD-10-CM

## 2022-04-05 DIAGNOSIS — L2389 Allergic contact dermatitis due to other agents: Secondary | ICD-10-CM

## 2022-04-05 DIAGNOSIS — T7800XA Anaphylactic reaction due to unspecified food, initial encounter: Secondary | ICD-10-CM

## 2022-04-05 DIAGNOSIS — H1013 Acute atopic conjunctivitis, bilateral: Secondary | ICD-10-CM

## 2022-04-05 DIAGNOSIS — J3089 Other allergic rhinitis: Secondary | ICD-10-CM | POA: Diagnosis not present

## 2022-04-05 DIAGNOSIS — H1010 Acute atopic conjunctivitis, unspecified eye: Secondary | ICD-10-CM

## 2022-04-05 DIAGNOSIS — J302 Other seasonal allergic rhinitis: Secondary | ICD-10-CM

## 2022-04-05 MED ORDER — FLUNISOLIDE 25 MCG/ACT (0.025%) NA SOLN: 2.0000 | Freq: Two times a day (BID) | NASAL | 3 refills | Status: DC

## 2022-04-05 NOTE — Patient Instructions (Addendum)
   Contact Dermatitis  -Continue avoidance measures with with gloves, protective clothing when working outdoors -If it recurs start triamcinolone 0.1% ointment twice a day,   Seasonal and perennial rhinitis:  - 07/05/21:  positive to grass, weed, trees, molds, dust mite and mouse  - Continue avoidance measures  - Continue  taking:  Flunisolide 1 spray per nostril as needed  and Xyzal (levocetirizine) '5mg'$  tablet once daily - You can use an extra dose of the antihistamine, if needed, for breakthrough symptoms.  - Continue  nasal saline rinses 1-2 times daily to remove allergens from the nasal cavities as well as help with mucous clearance (this is especially helpful to do before the nasal sprays are given)  Food allergy:  - please strictly avoid shellfish, we can introduced this with a food challenge at some point if  you are interested  - for SKIN only reaction, okay to take Benadryl 1 capsules every 6 hours - for SKIN + ANY additional symptoms, OR IF concern for LIFE THREATENING reaction = Epipen Autoinjector EpiPen 0.3 mg. - If using Epinephrine autoinjector, call 911 - A food allergy action plan has been provided and discussed. - Medic Alert identification is recommended.  Follow up: 6 months   Thank you so much for letting me partake in your care today.  Don't hesitate to reach out if you have any additional concerns!  Roney Marion, MD  [facility] [place] [place]

## 2022-04-05 NOTE — Progress Notes (Signed)
Follow Up Note  RE: Donna Wiley MRN: [id number] DOB: [birth year] Date of Office Visit: 04/05/2022  Referring provider: Charleston Poot, MD Primary care provider: Charleston Poot, MD  Chief Complaint: Follow-up (Pt states she's been doing okay, haven't had any reaction since last episode.)  History of Present Illness: I had the pleasure of seeing Donna Wiley for a follow up visit at the [facility] [place] on 04/05/2022. She is a 82 y.o. female, who is being followed for contact dermatitis, allergic rhinitis, food allergy. Her previous allergy office visit was on 10/06/22 with Dr. Edison Pace. Today is a regular follow up visit.  History obtained from patient, chart review .  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   - Not interested reintroduction    Allergic rhinitis: current therapy   symptoms  mild rhinnorrhea and sneezing since start of pollen season, resolves with as needed xyzal .   Not using flonase, but using saline rinses daily  Previous allergy testing:  07/05/2021:positive to grass, weed, trees, molds, dust mite and mouse  History of reflux/heartburn: no Interested in Allergy Immunotherapy:  previously on [facility] for 10 years  Denies any recurrence of contact dermatitis.   Has not tried gardening yet  Assessment and Plan: Laporscha is a 82 y.o. female with: Seasonal and perennial allergic rhinitis  Allergic contact dermatitis due to other agents  Allergy with anaphylaxis due to food  Seasonal allergic conjunctivitis Plan: Patient Instructions    Contact Dermatitis  -Continue avoidance measures with with gloves, protective clothing when working outdoors -If it recurs start triamcinolone 0.1% ointment twice a day,   Seasonal and perennial  rhinitis:  - 07/05/21:  positive to grass, weed, trees, molds, dust mite and mouse  - Continue avoidance measures  - Continue  taking:  Flunisolide 1 spray per nostril as needed  and Xyzal (levocetirizine) '5mg'$  tablet once daily - You can use an extra dose of the antihistamine, if needed, for breakthrough symptoms.  - Continue  nasal saline rinses 1-2 times daily to remove allergens from the nasal cavities as well as help with mucous clearance (this is especially helpful to do before the nasal sprays are given)  Food allergy:  - please strictly avoid shellfish, we can introduced this with a food challenge at some point if  you are interested  - for SKIN only reaction, okay to take Benadryl 1 capsules every 6 hours - for SKIN + ANY additional symptoms, OR IF concern for LIFE THREATENING reaction = Epipen Autoinjector EpiPen 0.3 mg. - If using Epinephrine autoinjector, call 911 - A food allergy action plan has been provided and discussed. - Medic Alert identification is recommended.  Follow up: 6 months   Thank you so much for letting me partake in your care today.  Don't hesitate to reach out if you have any additional concerns!  Roney Marion, MD  [facility] [place]  No follow-ups on file.  Meds ordered this encounter  Medications   flunisolide (NASALIDE) 25 MCG/ACT (0.025%) SOLN    Sig: Place 2 sprays into the nose 2 (two) times daily.    Dispense:  25 mL    Refill:  3    Lab Orders  No laboratory test(s) ordered today   Diagnostics: None performed    Medication  List:  Current Outpatient Medications  Medication Sig Dispense Refill   b complex vitamins capsule Take 1 capsule by mouth daily.     calcium citrate (CALCITRATE - DOSED IN MG ELEMENTAL CALCIUM) 950 (200 Ca) MG tablet Take 500 mg of elemental calcium by mouth daily.     cetirizine (ZYRTEC) 10 MG tablet Take 1 tablet by mouth daily.     clobetasol cream (TEMOVATE) 0.05 % Apply topically 2  (two) times daily.     diltiazem (CARDIZEM CD) 180 MG 24 hr capsule Take 180 mg by mouth daily.       EPINEPHrine 0.3 mg/0.3 mL IJ SOAJ injection Inject into the muscle.     flunisolide (NASALIDE) 25 MCG/ACT (0.025%) SOLN Place 2 sprays into the nose 2 (two) times daily. 25 mL 3   hydrOXYzine (ATARAX) 10 MG tablet Take 10 mg by mouth 4 (four) times daily.     insulin lispro (HUMALOG) 100 UNIT/ML injection Inject into the skin 3 (three) times daily before meals.     insulin lispro (HUMALOG) 100 UNIT/ML injection Humalog U-100 Insulin 100 unit/mL subcutaneous solution     levocetirizine (XYZAL) 5 MG tablet Take 1 tablet (5 mg total) by mouth every evening. 30 tablet 1   losartan-hydrochlorothiazide (HYZAAR) 100-12.5 MG tablet losartan 100 mg-hydrochlorothiazide 12.5 mg tablet     losartan-hydrochlorothiazide (HYZAAR) 50-12.5 MG tablet Take 1 tablet by mouth daily.     olmesartan-hydrochlorothiazide (BENICAR HCT) 40-12.5 MG tablet Take 1 tablet by mouth daily.     ONETOUCH ULTRA test strip USE TO CHECK BLOOD SUGAR 7 TIMES DAILY     pantoprazole (PROTONIX) 40 MG tablet Take 40 mg by mouth daily.     rivaroxaban (XARELTO) 20 MG TABS tablet Take by mouth.       simvastatin (ZOCOR) 20 MG tablet Take 20 mg by mouth at bedtime.       simvastatin (ZOCOR) 40 MG tablet Take 0.5 tablets by mouth daily.     betamethasone dipropionate 0.05 % cream betamethasone dipropionate 0.05 % topical cream  APP EXT AA BID FOR UP TO 2 WKS (Patient not taking: Reported on 10/05/2021)     flecainide (TAMBOCOR) 150 MG tablet Take 50 mg by mouth 2 (two) times daily. (Patient not taking: Reported on 04/05/2022)     flecainide (TAMBOCOR) 50 MG tablet TAKE 1 TABLET BY MOUTH EVERY MORNING THEN TAKE 2 TABLETS BY MOUTH EVERY EVENING (Patient not taking: Reported on 10/05/2021)     insulin detemir (LEVEMIR) 100 UNIT/ML injection Inject into the skin at bedtime. (Patient not taking: Reported on 10/05/2021)     insulin detemir (LEVEMIR) 100  UNIT/ML injection Inject into the skin. (Patient not taking: Reported on 10/05/2021)     Insulin Disposable Pump (OMNIPOD DASH PODS, GEN 4,) MISC Inject into the skin. (Patient not taking: Reported on 10/05/2021)     Insulin Disposable Pump (OMNIPOD DASH PODS, GEN 4,) MISC CHANGE EVERY 48 HOURS (Patient not taking: Reported on 10/05/2021)     insulin lispro (HUMALOG) 100 UNIT/ML injection USE VIA INSULIN PUMP. MAXIMUM DAILY DOSE IS 100 UNITS (Patient not taking: Reported on 04/05/2022)     LORazepam (ATIVAN) 2 MG tablet Take 2 mg by mouth at bedtime as needed. (Patient not taking: Reported on 10/05/2021)     LORazepam (ATIVAN) 2 MG tablet Take 1 tablet by mouth at bedtime as needed. (Patient not taking: Reported on 10/05/2021)     LORAZEPAM PO Take 2 mg by mouth. (Patient not taking: Reported on  10/05/2021)     LOSARTAN POTASSIUM-HCTZ PO Take by mouth. (Patient not taking: Reported on 07/05/2021)     metFORMIN (GLUCOPHAGE) 500 MG tablet Take by mouth daily. (Patient not taking: Reported on 07/05/2021)     mirabegron ER (MYRBETRIQ) 50 MG TB24 tablet Take 50 mg by mouth daily. (Patient not taking: Reported on 07/05/2021)     mirabegron ER (MYRBETRIQ) 50 MG TB24 tablet Take 1 tablet by mouth daily.     mupirocin ointment (BACTROBAN) 2 % mupirocin 2 % topical ointment  APPLY AA TID UNTIL CLEAR (Patient not taking: Reported on 10/05/2021)     olmesartan (BENICAR) 20 MG tablet Take 12.5 mg by mouth daily. (Patient not taking: Reported on 10/05/2021)     tolterodine (DETROL LA) 4 MG 24 hr capsule Take 4 mg by mouth daily. (Patient not taking: Reported on 10/05/2021)     triamcinolone cream (KENALOG) 0.1 % Apply topically 3 (three) times daily. (Patient not taking: Reported on 10/05/2021)     triamcinolone ointment (KENALOG) 0.1 % Apply sparingly to affected areas twice daily as needed below face and neck (Patient not taking: Reported on 10/05/2021) 45 g 1   No current facility-administered medications for this visit.    Allergies: Allergies  Allergen Reactions   Iodine Anaphylaxis   Nickel Rash   Charentais Melon (French Melon)    Cherry Swelling   Codeine    Ivp Dye [Iodinated Contrast Media]    Other     Tree nuts    Shellfish Allergy    Sulfa Antibiotics    Tape Dermatitis    Paper tape is best PAPER TAPE    I reviewed her past medical history, social history, family history, and environmental history and no significant changes have been reported from her previous visit.  ROS: All others negative except as noted per HPI.   Objective: BP 120/60   Pulse 76   Temp 98.1 F (36.7 C) (Temporal)   Resp 17   Wt 139 lb (63 kg)   SpO2 96%   BMI 25.42 kg/m  Body mass index is 25.42 kg/m. General Appearance:  Alert, cooperative, no distress, appears stated age  Head:  Normocephalic, without obvious abnormality, atraumatic  Eyes:  Conjunctiva clear, EOM's intact  Nose: Nares normal, hypertrophic turbinates, no visible anterior polyps, and septum midline  Throat: Lips, tongue normal; teeth and gums normal, normal posterior oropharynx  Neck: Supple, symmetrical  Lungs:   clear to auscultation bilaterally, Respirations unlabored, no coughing  Heart:  regular rate and rhythm and no murmur, Appears well perfused  Extremities: No edema  Skin: Skin color, texture, turgor normal, no rashes or lesions on visualized portions of skin   Neurologic: No gross deficits   Previous notes and tests were reviewed. The plan was reviewed with the patient/family, and all questions/concerned were addressed.  It was my pleasure to see Donna Wiley today and participate in her care. Please feel free to contact me with any questions or concerns.  Sincerely,  Roney Marion, MD  Allergy & Immunology  [facility] Office: [phone]

## 2022-09-29 ENCOUNTER — Other Ambulatory Visit: Payer: Self-pay | Admitting: Obstetrics and Gynecology

## 2022-09-29 DIAGNOSIS — Z1231 Encounter for screening mammogram for malignant neoplasm of breast: Secondary | ICD-10-CM

## 2022-10-10 ENCOUNTER — Ambulatory Visit: Payer: Medicare PPO | Admitting: Internal Medicine

## 2022-10-31 ENCOUNTER — Ambulatory Visit: Payer: Medicare PPO | Admitting: Internal Medicine

## 2022-10-31 ENCOUNTER — Encounter: Payer: Self-pay | Admitting: Internal Medicine

## 2022-10-31 VITALS — BP 130/82 | HR 81 | Temp 98.1°F | Resp 18

## 2022-10-31 DIAGNOSIS — T7800XA Anaphylactic reaction due to unspecified food, initial encounter: Secondary | ICD-10-CM

## 2022-10-31 DIAGNOSIS — J302 Other seasonal allergic rhinitis: Secondary | ICD-10-CM

## 2022-10-31 DIAGNOSIS — L2389 Allergic contact dermatitis due to other agents: Secondary | ICD-10-CM | POA: Diagnosis not present

## 2022-10-31 DIAGNOSIS — J3089 Other allergic rhinitis: Secondary | ICD-10-CM | POA: Diagnosis not present

## 2022-10-31 MED ORDER — CLOBETASOL PROPIONATE 0.05 % EX CREA: 1.0000 | TOPICAL_CREAM | Freq: Two times a day (BID) | CUTANEOUS | 0 refills | Status: DC

## 2022-10-31 NOTE — Patient Instructions (Addendum)
   Contact Dermatitis  -Continue avoidance measures with with gloves, protective clothing when working outdoors -If it recurs use clobetasol cream  twice a daily until symptoms resolve.   Seasonal and perennial rhinitis:  - 07/05/21:  positive to grass, weed, trees, molds, dust mite and mouse  - Continue avoidance measures  - Continue  taking:    and Xyzal (levocetirizine) 5mg  tablet once daily - You can use an extra dose of the antihistamine, if needed, for breakthrough symptoms.  - Continue  nasal saline rinses 1-2 times daily to remove allergens from the nasal cavities as well as help with mucous clearance (this is especially helpful to do before the nasal sprays are given)  Food allergy:  - please strictly avoid shellfish, we can introduced this with a food challenge at some point if  you are interested  - for SKIN only reaction, okay to take Benadryl 1 capsules every 6 hours - for SKIN + ANY additional symptoms, OR IF concern for LIFE THREATENING reaction = Epipen Autoinjector EpiPen 0.3 mg. - If using Epinephrine autoinjector, call 911 - A food allergy action plan has been provided and discussed. - Medic Alert identification is recommended.  Follow up: 6 months   Thank you so much for letting me partake in your care today.  Don't hesitate to reach out if you have any additional concerns!  Ferol Luz, MD  [facility] [place], [place]

## 2022-10-31 NOTE — Progress Notes (Signed)
Follow Up Note  RE: Donna Wiley MRN: [id number] DOB: 1940-05-28 Date of Office Visit: 10/31/2022  Referring provider: Forrest Moron, MD Primary care provider: Raquel James, MD  Chief Complaint: Allergic Rhinitis  (/)  History of Present Illness: I had the pleasure of seeing Donna Wiley for a follow up visit at the [facility] on 10/31/2022. She is a 82 y.o. female, who is being followed for contact dermatitis, allergic rhinitis, food allergy. Her previous allergy office visit was on 04/04/21  with Dr. Marlynn Perking. Today is a regular follow up visit.  History obtained from patient, chart review .  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   - Not interested reintroduction    Allergic rhinitis: current therapy   symptoms  mild rhinnorrhea and sneezing when working outside , resolves with as needed xyzal .   Using saline rinses Previous allergy testing:  07/05/2021:positive to grass, weed, trees, molds, dust mite and mouse  History of reflux/heartburn: no Interested in Allergy Immunotherapy:  previously on Ait for 10 years   Continues to have recurrent contact dermatitis when she gardens.  She is using gloves.  Responds better to clobetasol ointment rather than triamicinolone.   Assessment and Plan: Donna Wiley is a 82 y.o. female with: Seasonal and perennial allergic rhinitis  Allergic contact dermatitis due to other agents  Allergy with anaphylaxis due to food Plan: Patient Instructions    Contact Dermatitis  -Continue avoidance measures with with gloves, protective clothing when working outdoors -If it recurs use clobetasol cream  twice a daily until symptoms resolve.   Seasonal and perennial rhinitis:  - 07/05/21:  positive to grass, weed,  trees, molds, dust mite and mouse  - Continue avoidance measures  - Continue  taking:    and Xyzal (levocetirizine) 5mg  tablet once daily - You can use an extra dose of the antihistamine, if needed, for breakthrough symptoms.  - Continue  nasal saline rinses 1-2 times daily to remove allergens from the nasal cavities as well as help with mucous clearance (this is especially helpful to do before the nasal sprays are given)  Food allergy:  - please strictly avoid shellfish, we can introduced this with a food challenge at some point if  you are interested  - for SKIN only reaction, okay to take Benadryl 1 capsules every 6 hours - for SKIN + ANY additional symptoms, OR IF concern for LIFE THREATENING reaction = Epipen Autoinjector EpiPen 0.3 mg. - If using Epinephrine autoinjector, call 911 - A food allergy action plan has been provided and discussed. - Medic Alert identification is recommended.  Follow up: 6 months   Thank you so much for letting me partake in your care today.  Don't hesitate to reach out if you have any additional concerns!  Ferol Luz, MD  [facility] [place] No follow-ups on file.  Meds ordered this encounter  Medications   clobetasol cream (TEMOVATE) 0.05 %    Sig: Apply 1 Application topically 2 (two) times daily.    Dispense:  60 each    Refill:  0    Lab Orders  No laboratory test(s) ordered today   Diagnostics: None done     Medication List:  Current Outpatient Medications  Medication Sig Dispense Refill   b complex  vitamins capsule Take 1 capsule by mouth daily.     calcium citrate (CALCITRATE - DOSED IN MG ELEMENTAL CALCIUM) 950 (200 Ca) MG tablet Take 500 mg of elemental calcium by mouth daily.     cetirizine (ZYRTEC) 10 MG tablet Take 1 tablet by mouth daily.     clobetasol cream (TEMOVATE) 0.05 % Apply topically 2 (two) times daily.     clobetasol cream (TEMOVATE) 0.05 % Apply 1 Application topically 2 (two) times  daily. 60 each 0   diltiazem (CARDIZEM CD) 180 MG 24 hr capsule Take 180 mg by mouth daily.       EPINEPHrine 0.3 mg/0.3 mL IJ SOAJ injection Inject into the muscle.     flunisolide (NASALIDE) 25 MCG/ACT (0.025%) SOLN Place 2 sprays into the nose 2 (two) times daily. 25 mL 3   hydrOXYzine (ATARAX) 10 MG tablet Take 10 mg by mouth 4 (four) times daily.     insulin lispro (HUMALOG) 100 UNIT/ML injection Inject into the skin 3 (three) times daily before meals.     levocetirizine (XYZAL) 5 MG tablet Take 1 tablet (5 mg total) by mouth every evening. 30 tablet 1   losartan-hydrochlorothiazide (HYZAAR) 100-12.5 MG tablet losartan 100 mg-hydrochlorothiazide 12.5 mg tablet     losartan-hydrochlorothiazide (HYZAAR) 50-12.5 MG tablet Take 1 tablet by mouth daily.     mupirocin ointment (BACTROBAN) 2 %      ONETOUCH ULTRA test strip USE TO CHECK BLOOD SUGAR 7 TIMES DAILY     pantoprazole (PROTONIX) 40 MG tablet Take 40 mg by mouth daily.     rivaroxaban (XARELTO) 20 MG TABS tablet Take by mouth.       simvastatin (ZOCOR) 20 MG tablet Take 20 mg by mouth at bedtime.       simvastatin (ZOCOR) 40 MG tablet Take 0.5 tablets by mouth daily.     Insulin Disposable Pump (OMNIPOD DASH PODS, GEN 4,) MISC Inject into the skin. (Patient not taking: Reported on 10/05/2021)     LOSARTAN POTASSIUM-HCTZ PO Take by mouth. (Patient not taking: Reported on 07/05/2021)     metFORMIN (GLUCOPHAGE) 500 MG tablet Take by mouth daily. (Patient not taking: Reported on 07/05/2021)     mirabegron ER (MYRBETRIQ) 50 MG TB24 tablet Take 50 mg by mouth daily. (Patient not taking: Reported on 07/05/2021)     mirabegron ER (MYRBETRIQ) 50 MG TB24 tablet Take 1 tablet by mouth daily.     olmesartan-hydrochlorothiazide (BENICAR HCT) 40-12.5 MG tablet Take 1 tablet by mouth daily. (Patient not taking: Reported on 10/31/2022)     tolterodine (DETROL LA) 4 MG 24 hr capsule Take 4 mg by mouth daily. (Patient not taking: Reported on 10/05/2021)     No  current facility-administered medications for this visit.   Allergies: Allergies  Allergen Reactions   Iodine Anaphylaxis   Nickel Rash   Charentais Melon (French Melon)    Cherry Swelling   Codeine    Ivp Dye [Iodinated Contrast Media]    Other     Tree nuts    Shellfish Allergy    Sulfa Antibiotics    Tape Dermatitis    Paper tape is best PAPER TAPE    I reviewed her past medical history, social history, family history, and environmental history and no significant changes have been reported from her previous visit.  ROS: All others negative except as noted per HPI.   Objective: BP 130/82   Pulse 81   Temp 98.1 F (36.7 C) (Temporal)  Resp 18   SpO2 95%  There is no height or weight on file to calculate BMI. General Appearance:  Alert, cooperative, no distress, appears stated age  Head:  Normocephalic, without obvious abnormality, atraumatic  Eyes:  Conjunctiva clear, EOM's intact  Nose: Nares normal, hypertrophic turbinates, no visible anterior polyps, and septum midline  Throat: Lips, tongue normal; teeth and gums normal, normal posterior oropharynx  Neck: Supple, symmetrical  Lungs:   clear to auscultation bilaterally, Respirations unlabored, no coughing  Heart:  regular rate and rhythm and no murmur, Appears well perfused  Extremities: No edema  Skin: Skin color, texture, turgor normal, no rashes or lesions on visualized portions of skin   Neurologic: No gross deficits   Previous notes and tests were reviewed. The plan was reviewed with the patient/family, and all questions/concerned were addressed.  It was my pleasure to see Donna Wiley today and participate in her care. Please feel free to contact me with any questions or concerns.  Sincerely,  Ferol Luz, MD  Allergy & Immunology  [facility] Office: [phone]

## 2022-11-07 ENCOUNTER — Ambulatory Visit
Admission: RE | Admit: 2022-11-07 | Discharge: 2022-11-07 | Disposition: A | Discharge status: Home / Self Care | Payer: Medicare PPO | Source: Ambulatory Visit | Attending: Obstetrics and Gynecology | Admitting: Obstetrics and Gynecology

## 2022-11-07 DIAGNOSIS — Z1231 Encounter for screening mammogram for malignant neoplasm of breast: Secondary | ICD-10-CM

## 2023-01-09 ENCOUNTER — Inpatient Hospital Stay (HOSPITAL_BASED_OUTPATIENT_CLINIC_OR_DEPARTMENT_OTHER)
Admission: EM | Admit: 2023-01-09 | Discharge: 2023-01-17 | DRG: 392 | Disposition: A | Discharge status: Home / Self Care | Payer: Medicare PPO | Attending: Family Medicine | Admitting: Family Medicine

## 2023-01-09 ENCOUNTER — Other Ambulatory Visit: Payer: Self-pay

## 2023-01-09 ENCOUNTER — Encounter (HOSPITAL_BASED_OUTPATIENT_CLINIC_OR_DEPARTMENT_OTHER): Payer: Self-pay

## 2023-01-09 DIAGNOSIS — K2980 Duodenitis without bleeding: Secondary | ICD-10-CM | POA: Diagnosis present

## 2023-01-09 DIAGNOSIS — E10649 Type 1 diabetes mellitus with hypoglycemia without coma: Secondary | ICD-10-CM | POA: Diagnosis not present

## 2023-01-09 DIAGNOSIS — Z79899 Other long term (current) drug therapy: Secondary | ICD-10-CM

## 2023-01-09 DIAGNOSIS — Z888 Allergy status to other drugs, medicaments and biological substances status: Secondary | ICD-10-CM

## 2023-01-09 DIAGNOSIS — R194 Change in bowel habit: Secondary | ICD-10-CM

## 2023-01-09 DIAGNOSIS — Z91041 Radiographic dye allergy status: Secondary | ICD-10-CM

## 2023-01-09 DIAGNOSIS — K219 Gastro-esophageal reflux disease without esophagitis: Secondary | ICD-10-CM | POA: Diagnosis present

## 2023-01-09 DIAGNOSIS — Z91013 Allergy to seafood: Secondary | ICD-10-CM

## 2023-01-09 DIAGNOSIS — R590 Localized enlarged lymph nodes: Secondary | ICD-10-CM | POA: Insufficient documentation

## 2023-01-09 DIAGNOSIS — K529 Noninfective gastroenteritis and colitis, unspecified: Secondary | ICD-10-CM | POA: Diagnosis not present

## 2023-01-09 DIAGNOSIS — I060 Rheumatic aortic stenosis: Secondary | ICD-10-CM | POA: Diagnosis present

## 2023-01-09 DIAGNOSIS — Z794 Long term (current) use of insulin: Secondary | ICD-10-CM

## 2023-01-09 DIAGNOSIS — N179 Acute kidney failure, unspecified: Secondary | ICD-10-CM | POA: Insufficient documentation

## 2023-01-09 DIAGNOSIS — A049 Bacterial intestinal infection, unspecified: Principal | ICD-10-CM | POA: Diagnosis present

## 2023-01-09 DIAGNOSIS — Z9641 Presence of insulin pump (external) (internal): Secondary | ICD-10-CM | POA: Diagnosis present

## 2023-01-09 DIAGNOSIS — Z885 Allergy status to narcotic agent status: Secondary | ICD-10-CM

## 2023-01-09 DIAGNOSIS — M1990 Unspecified osteoarthritis, unspecified site: Secondary | ICD-10-CM | POA: Diagnosis present

## 2023-01-09 DIAGNOSIS — I2510 Atherosclerotic heart disease of native coronary artery without angina pectoris: Secondary | ICD-10-CM | POA: Diagnosis present

## 2023-01-09 DIAGNOSIS — R197 Diarrhea, unspecified: Principal | ICD-10-CM | POA: Diagnosis present

## 2023-01-09 DIAGNOSIS — Z91018 Allergy to other foods: Secondary | ICD-10-CM

## 2023-01-09 DIAGNOSIS — Z86718 Personal history of other venous thrombosis and embolism: Secondary | ICD-10-CM

## 2023-01-09 DIAGNOSIS — Z825 Family history of asthma and other chronic lower respiratory diseases: Secondary | ICD-10-CM

## 2023-01-09 DIAGNOSIS — Z882 Allergy status to sulfonamides status: Secondary | ICD-10-CM

## 2023-01-09 DIAGNOSIS — E872 Acidosis, unspecified: Secondary | ICD-10-CM | POA: Diagnosis present

## 2023-01-09 DIAGNOSIS — D539 Nutritional anemia, unspecified: Secondary | ICD-10-CM | POA: Diagnosis present

## 2023-01-09 DIAGNOSIS — Z7901 Long term (current) use of anticoagulants: Secondary | ICD-10-CM

## 2023-01-09 DIAGNOSIS — Z9049 Acquired absence of other specified parts of digestive tract: Secondary | ICD-10-CM

## 2023-01-09 DIAGNOSIS — K644 Residual hemorrhoidal skin tags: Secondary | ICD-10-CM | POA: Diagnosis present

## 2023-01-09 DIAGNOSIS — I10 Essential (primary) hypertension: Secondary | ICD-10-CM | POA: Diagnosis present

## 2023-01-09 DIAGNOSIS — E7800 Pure hypercholesterolemia, unspecified: Secondary | ICD-10-CM | POA: Diagnosis present

## 2023-01-09 DIAGNOSIS — E869 Volume depletion, unspecified: Secondary | ICD-10-CM | POA: Diagnosis present

## 2023-01-09 DIAGNOSIS — Z7984 Long term (current) use of oral hypoglycemic drugs: Secondary | ICD-10-CM

## 2023-01-09 DIAGNOSIS — D509 Iron deficiency anemia, unspecified: Secondary | ICD-10-CM | POA: Diagnosis present

## 2023-01-09 DIAGNOSIS — I480 Paroxysmal atrial fibrillation: Secondary | ICD-10-CM | POA: Diagnosis present

## 2023-01-09 DIAGNOSIS — L309 Dermatitis, unspecified: Secondary | ICD-10-CM | POA: Diagnosis present

## 2023-01-09 DIAGNOSIS — E860 Dehydration: Secondary | ICD-10-CM | POA: Diagnosis present

## 2023-01-09 DIAGNOSIS — E876 Hypokalemia: Secondary | ICD-10-CM | POA: Diagnosis present

## 2023-01-09 LAB — CBC
HCT: 34.4 % — ABNORMAL LOW (ref 36.0–46.0)
Hemoglobin: 11.6 g/dL — ABNORMAL LOW (ref 12.0–15.0)
MCH: 29.8 pg (ref 26.0–34.0)
MCHC: 33.7 g/dL (ref 30.0–36.0)
MCV: 88.4 fL (ref 80.0–100.0)
Platelets: 367 10*3/uL (ref 150–400)
RBC: 3.89 MIL/uL (ref 3.87–5.11)
RDW: 14 % (ref 11.5–15.5)
WBC: 4.6 10*3/uL (ref 4.0–10.5)
nRBC: 0 % (ref 0.0–0.2)

## 2023-01-09 LAB — COMPREHENSIVE METABOLIC PANEL
ALT: 41 U/L (ref 0–44)
AST: 35 U/L (ref 15–41)
Albumin: 3.3 g/dL — ABNORMAL LOW (ref 3.5–5.0)
Alkaline Phosphatase: 44 U/L (ref 38–126)
Anion gap: 10 (ref 5–15)
BUN: 41 mg/dL — ABNORMAL HIGH (ref 8–23)
CO2: 18 mmol/L — ABNORMAL LOW (ref 22–32)
Calcium: 8.8 mg/dL — ABNORMAL LOW (ref 8.9–10.3)
Chloride: 107 mmol/L (ref 98–111)
Creatinine, Ser: 1.29 mg/dL — ABNORMAL HIGH (ref 0.44–1.00)
GFR, Estimated: 41 mL/min — ABNORMAL LOW (ref >60)
Glucose, Bld: 166 mg/dL — ABNORMAL HIGH (ref 70–99)
Potassium: 3.1 mmol/L — ABNORMAL LOW (ref 3.5–5.1)
Sodium: 135 mmol/L (ref 135–145)
Total Bilirubin: 0.8 mg/dL (ref <1.2)
Total Protein: 5.9 g/dL — ABNORMAL LOW (ref 6.5–8.1)

## 2023-01-09 LAB — URINALYSIS, ROUTINE W REFLEX MICROSCOPIC
Bilirubin Urine: NEGATIVE
Glucose, UA: NEGATIVE mg/dL
Hgb urine dipstick: NEGATIVE
Ketones, ur: 15 mg/dL — AB
Leukocytes,Ua: NEGATIVE
Nitrite: NEGATIVE
Protein, ur: 30 mg/dL — AB
Specific Gravity, Urine: 1.02 (ref 1.005–1.030)
pH: 6 (ref 5.0–8.0)

## 2023-01-09 LAB — URINALYSIS, MICROSCOPIC (REFLEX)

## 2023-01-09 LAB — LACTIC ACID, PLASMA: Lactic Acid, Venous: 0.8 mmol/L (ref 0.5–1.9)

## 2023-01-09 LAB — C DIFFICILE QUICK SCREEN W PCR REFLEX
C Diff antigen: NEGATIVE
C Diff interpretation: NOT DETECTED
C Diff toxin: NEGATIVE

## 2023-01-09 LAB — CBG MONITORING, ED: Glucose-Capillary: 142 mg/dL — ABNORMAL HIGH (ref 70–99)

## 2023-01-09 LAB — MAGNESIUM: Magnesium: 1.9 mg/dL (ref 1.7–2.4)

## 2023-01-09 LAB — LIPASE, BLOOD: Lipase: 35 U/L (ref 11–51)

## 2023-01-09 MED ORDER — POTASSIUM CHLORIDE 10 MEQ/100ML IV SOLN
10.0000 meq | INTRAVENOUS | Status: AC
  Administered 2023-01-09 – 2023-01-10 (×3): 10 meq via INTRAVENOUS
  Filled 2023-01-09 (×3): qty 100

## 2023-01-09 MED ORDER — LACTATED RINGERS IV BOLUS
1000.0000 mL | Freq: Once | INTRAVENOUS | Status: AC
  Administered 2023-01-09: 1000 mL via INTRAVENOUS

## 2023-01-09 NOTE — ED Provider Notes (Addendum)
[facility] Provider Note   CSN: [id number] Arrival date & time: 01/09/23  1709     History  Chief Complaint  Patient presents with   Diarrhea    Terisha Borum is a 82 y.o. female.   Diarrhea 82 year old female history of atrial fibrillation on Xarelto, type 1 diabetes, GERD presenting for diarrhea.  Patient states she has had diarrhea for 10 days.  She is on multiple signs of nonbloody diarrhea daily.  She has had some irritation of the rectum.  No melena.  No nausea vomiting.  Decreased p.o. intake.  She has had 5 episodes just in the 4 hours she was in the waiting room here.  No recent antibiotics or history of C. difficile.  She has diffuse abdominal pain.  She has had 2 CT scans recently December 6 and 7.  The December 7 scan was notable for moderate central mesenteric stranding with some lymph nodes which are slightly progressive from prior.  No other acute findings.  No known sick contacts.  No recent change in diet.     Home Medications Prior to Admission medications   Medication Sig Start Date End Date Taking? Authorizing Provider  b complex vitamins capsule Take 1 capsule by mouth daily.    [provider]  calcium citrate (CALCITRATE - DOSED IN MG ELEMENTAL CALCIUM) 950 (200 Ca) MG tablet Take 500 mg of elemental calcium by mouth daily.    [provider]  cetirizine (ZYRTEC) 10 MG tablet Take 1 tablet by mouth daily.    [provider]  clobetasol cream (TEMOVATE) 0.05 % Apply topically 2 (two) times daily. 09/15/21   [provider]  clobetasol cream (TEMOVATE) 0.05 % Apply 1 Application topically 2 (two) times daily. 10/31/22   Ferol Luz, MD  diltiazem (CARDIZEM CD) 180 MG 24 hr capsule Take 180 mg by mouth daily.      [provider]  EPINEPHrine 0.3 mg/0.3 mL IJ SOAJ injection Inject into the muscle. 06/21/21   [provider]  flunisolide (NASALIDE) 25  MCG/ACT (0.025%) SOLN Place 2 sprays into the nose 2 (two) times daily. 04/05/22   Ferol Luz, MD  hydrOXYzine (ATARAX) 10 MG tablet Take 10 mg by mouth 4 (four) times daily. 06/21/21   [provider]  Insulin Disposable Pump (OMNIPOD DASH PODS, GEN 4,) MISC Inject into the skin. Patient not taking: Reported on 10/05/2021 09/03/21   [provider]  insulin lispro (HUMALOG) 100 UNIT/ML injection Inject into the skin 3 (three) times daily before meals.    [provider]  levocetirizine (XYZAL) 5 MG tablet Take 1 tablet (5 mg total) by mouth every evening. 10/05/21   Ferol Luz, MD  LOSARTAN POTASSIUM-HCTZ PO Take by mouth. Patient not taking: Reported on 07/05/2021    [provider]  losartan-hydrochlorothiazide (HYZAAR) 100-12.5 MG tablet losartan 100 mg-hydrochlorothiazide 12.5 mg tablet    [provider]  losartan-hydrochlorothiazide (HYZAAR) 50-12.5 MG tablet Take 1 tablet by mouth daily.    [provider]  metFORMIN (GLUCOPHAGE) 500 MG tablet Take by mouth daily. Patient not taking: Reported on 07/05/2021    [provider]  mirabegron ER (MYRBETRIQ) 50 MG TB24 tablet Take 50 mg by mouth daily. Patient not taking: Reported on 07/05/2021    [provider]  mirabegron ER (MYRBETRIQ) 50 MG TB24 tablet Take 1 tablet by mouth daily. 07/13/21 10/11/21  [provider]  mupirocin ointment (BACTROBAN) 2 %  [provider]  olmesartan-hydrochlorothiazide (BENICAR HCT) 40-12.5 MG tablet Take 1 tablet by mouth daily. Patient not taking: Reported on 10/31/2022 02/17/21   [provider]  ONETOUCH ULTRA test strip USE TO CHECK BLOOD SUGAR 7 TIMES DAILY 09/04/21   [provider]  pantoprazole (PROTONIX) 40 MG tablet Take 40 mg by mouth daily. 06/02/21   [provider]  rivaroxaban (XARELTO) 20 MG TABS tablet Take by mouth.      [provider]  simvastatin (ZOCOR) 20 MG tablet Take  20 mg by mouth at bedtime.      [provider]  simvastatin (ZOCOR) 40 MG tablet Take 0.5 tablets by mouth daily.    [provider]  tolterodine (DETROL LA) 4 MG 24 hr capsule Take 4 mg by mouth daily. Patient not taking: Reported on 10/05/2021 06/24/21   [provider]      Allergies    Iodine, Nickel, Charentais melon (french melon), Cherry, Codeine, Ivp dye [iodinated contrast media], Other, Shellfish allergy, Sulfa antibiotics, and Tape    Review of Systems   Review of Systems  Gastrointestinal:  Positive for diarrhea.  Review of systems completed and notable as per HPI.  ROS otherwise negative.   Physical Exam Updated Vital Signs BP 131/63   Pulse 84   Temp 98.1 F (36.7 C) (Oral)   Resp 14   Ht 5\' 2"  (1.575 m)   Wt 59 kg   SpO2 100%   BMI 23.78 kg/m  Physical Exam Vitals and nursing note reviewed.  Constitutional:      General: She is not in acute distress.    Appearance: She is well-developed.  HENT:     Head: Normocephalic and atraumatic.     Mouth/Throat:     Mouth: Mucous membranes are dry.     Pharynx: Oropharynx is clear.  Eyes:     Extraocular Movements: Extraocular movements intact.     Conjunctiva/sclera: Conjunctivae normal.     Pupils: Pupils are equal, round, and reactive to light.  Cardiovascular:     Rate and Rhythm: Normal rate and regular rhythm.     Pulses: Normal pulses.     Heart sounds: Normal heart sounds. No murmur heard. Pulmonary:     Effort: Pulmonary effort is normal. No respiratory distress.     Breath sounds: Normal breath sounds.  Abdominal:     Palpations: Abdomen is soft.     Tenderness: There is abdominal tenderness. There is no guarding or rebound.  Musculoskeletal:        General: No swelling.     Cervical back: Neck supple.     Right lower leg: No edema.     Left lower leg: No edema.  Skin:    General: Skin is warm and dry.     Capillary Refill: Capillary refill takes less than 2 seconds.   Neurological:     General: No focal deficit present.     Mental Status: She is alert and oriented to person, place, and time. Mental status is at baseline.     Cranial Nerves: No cranial nerve deficit.     Sensory: No sensory deficit.     Motor: No weakness.  Psychiatric:        Mood and Affect: Mood normal.     ED Results / Procedures / Treatments   Labs (all labs ordered are listed, but only abnormal results are displayed) Labs Reviewed  COMPREHENSIVE METABOLIC PANEL - Abnormal; Notable for the following components:  Result Value   Potassium 3.1 (*)    CO2 18 (*)    Glucose, Bld 166 (*)    BUN 41 (*)    Creatinine, Ser 1.29 (*)    Calcium 8.8 (*)    Total Protein 5.9 (*)    Albumin 3.3 (*)    GFR, Estimated 41 (*)    All other components within normal limits  CBC - Abnormal; Notable for the following components:   Hemoglobin 11.6 (*)    HCT 34.4 (*)    All other components within normal limits  URINALYSIS, ROUTINE W REFLEX MICROSCOPIC - Abnormal; Notable for the following components:   Ketones, ur 15 (*)    Protein, ur 30 (*)    All other components within normal limits  URINALYSIS, MICROSCOPIC (REFLEX) - Abnormal; Notable for the following components:   Bacteria, UA FEW (*)    All other components within normal limits  CBG MONITORING, ED - Abnormal; Notable for the following components:   Glucose-Capillary 142 (*)    All other components within normal limits  C DIFFICILE QUICK SCREEN W PCR REFLEX    GASTROINTESTINAL PANEL BY PCR, STOOL (REPLACES STOOL CULTURE)  LIPASE, BLOOD  LACTIC ACID, PLASMA  MAGNESIUM    EKG EKG Interpretation Date/Time:  Monday January 09 2023 17:52:42 EST Ventricular Rate:  102 PR Interval:  139 QRS Duration:  88 QT Interval:  355 QTC Calculation: 463 R Axis:   -22  Text Interpretation: Sinus tachycardia Borderline left axis deviation Nonspecific T abnormalities, lateral leads Baseline wander in lead(s) V1 Confirmed by Fulton Reek [phone]) on 01/09/2023 10:49:24 PM  Radiology No results found.  Procedures Procedures    Medications Ordered in ED Medications  potassium chloride 10 mEq in 100 mL IVPB (has no administration in time range)  lactated ringers bolus 1,000 mL (0 mLs Intravenous Stopped 01/09/23 2300)    ED Course/ Medical Decision Making/ A&P                                 Medical Decision Making Amount and/or Complexity of Data Reviewed Labs: ordered.  Risk Prescription drug management. Decision regarding hospitalization.   Medical Decision Making:   Lailanee Arends is a 82 y.o. female who presented to the ED today with fever.  EKG shows sinus tachycardia.  She has mild diffuse abdominal tenderness.  She is a multiple CT scans recently without acute findings I do not think she needs repeat imaging here.  Her labs normal for mild AKI creatinine 1.3 from baseline proximal 1-1.1.  She is mildly hyperglycemic but not consistent with DKA.  On her home insulin pump.  Potassium slightly low, check magnesium magnesium but also add on C. difficile and GI pathogen panel given degree of her diarrhea.   Patient placed on continuous vitals and telemetry monitoring while in ED which was reviewed periodically.  Reviewed and confirmed nursing documentation for past medical history, family history, social history.  Reassessment and Plan:   Patient remained stable.  She has had a couple additional episodes of nonbloody diarrhea here.  Lactic acid is normal.  Given degree of her diarrhea and dehydration I think she would benefit from admission for further evaluation and management.  Discussed patient with Dr. Janalyn Shy she was admitted for further management awaiting transfer.   Patient's presentation is most consistent with acute complicated illness / injury requiring diagnostic workup.  Final Clinical Impression(s) / ED Diagnoses Final diagnoses:  Diarrhea, unspecified type    Rx  / DC Orders ED Discharge Orders     None         Laurence Spates, MD 01/09/23 2349

## 2023-01-09 NOTE — Plan of Care (Addendum)
Plan of Care Note for accepted transfer  Patient: Donna Wiley              ZOX:[id number]  DOA: 01/09/2023     Facility requesting transfer: [facility] Requesting Provider: Dr. Earlene Plater  Reason for transfer: Evaluation for diarrhea and patient need IV fluid for the management of AKI in the context of diarrhea.  Facility course: 82 year old female history of paroxysmal atrial fibrillation on Xarelto, DM type II, GERD and essential hypertension, Presented to emergency department for evaluation for diarrhea and abdominal pain.   Patient states she has had diarrhea for 10 days. She is on multiple signs of nonbloody diarrhea daily. She has had some irritation of the rectum. No melena. No nausea vomiting. Decreased p.o. intake. She has had 5 episodes just in the 4 hours she was in the waiting room here. No recent antibiotics or history of C. difficile. She has diffuse abdominal pain. No other acute findings. No known sick contacts. No recent change in diet.   At presentation to ED patient is hemodynamically stable. Lipase 35. CMP showing hypokalemia 3.1, low bicarb 18, elevated creatinine 1.29, otherwise unremarkable.  Mag 1.9 CBC unremarkable slightly low hemoglobin 11.6. Lactic acid WNL. Pending C. difficile. UA positive ketone and protein. EKG sinus tachycardia heart rate 102.  Patient reported that she has been evaluated in the outside ED on 12/4 and 12/6 for evaluation for diarrhea and CT scan unremarkable findings except showed some lymph node enlargement.  CT scan from 12/4 and 12/6 is available from the [facility] on the chart. CT impression 12/6 Moderate central mesenteric stranding with some small nodes.  Progressive from older examinations. No splenomegaly or pathologic  nodal enlargement. Patent mesenteric vessels. There is a broad  differential. Recommend follow up in 3-6 months to assess stability.    [facility] in the ED patient has 7 episodes  of volume is diarrhea.  Patient does not have any nausea and vomiting.  Mild abdominal pain on palpation.  Due to persistent diarrhea and evidence of AKI hospitalist has been contacted for evaluation for diarrhea and patient needs some IV fluid interim.  Pending C. difficile and GI panel.   Plan of care: The patient is accepted for admission for observation status to medical-Telemetry unit, at [facility].  Check [email or web address] for on-call coverage.  [facility] will assume care on arrival to accepting facility. Until arrival, medical decision making responsibilities remain with the EDP.  However, [facility] available 24/7 for questions and assistance.   Nursing staff please page [facility] Admits and Consults [phone]) as soon as the patient arrives to the hospital.    Author: Tereasa Coop, MD  01/09/2023  [facility]

## 2023-01-09 NOTE — ED Triage Notes (Signed)
C/o diarrhea x 10 days. Seen at [facility] 2 times with normal CT. States feels dehydrated & fatigued. Denies fever. Intermittent abdominal pain.

## 2023-01-10 DIAGNOSIS — I10 Essential (primary) hypertension: Secondary | ICD-10-CM | POA: Diagnosis not present

## 2023-01-10 DIAGNOSIS — E10649 Type 1 diabetes mellitus with hypoglycemia without coma: Secondary | ICD-10-CM | POA: Diagnosis not present

## 2023-01-10 DIAGNOSIS — I060 Rheumatic aortic stenosis: Secondary | ICD-10-CM | POA: Diagnosis not present

## 2023-01-10 DIAGNOSIS — K529 Noninfective gastroenteritis and colitis, unspecified: Secondary | ICD-10-CM | POA: Insufficient documentation

## 2023-01-10 DIAGNOSIS — R197 Diarrhea, unspecified: Secondary | ICD-10-CM | POA: Diagnosis present

## 2023-01-10 DIAGNOSIS — N179 Acute kidney failure, unspecified: Secondary | ICD-10-CM | POA: Insufficient documentation

## 2023-01-10 DIAGNOSIS — Z91041 Radiographic dye allergy status: Secondary | ICD-10-CM | POA: Diagnosis not present

## 2023-01-10 DIAGNOSIS — Z794 Long term (current) use of insulin: Secondary | ICD-10-CM | POA: Diagnosis not present

## 2023-01-10 DIAGNOSIS — Z7901 Long term (current) use of anticoagulants: Secondary | ICD-10-CM | POA: Diagnosis not present

## 2023-01-10 DIAGNOSIS — I2510 Atherosclerotic heart disease of native coronary artery without angina pectoris: Secondary | ICD-10-CM | POA: Diagnosis not present

## 2023-01-10 DIAGNOSIS — D509 Iron deficiency anemia, unspecified: Secondary | ICD-10-CM | POA: Diagnosis not present

## 2023-01-10 DIAGNOSIS — R590 Localized enlarged lymph nodes: Secondary | ICD-10-CM | POA: Insufficient documentation

## 2023-01-10 DIAGNOSIS — E7800 Pure hypercholesterolemia, unspecified: Secondary | ICD-10-CM | POA: Diagnosis not present

## 2023-01-10 DIAGNOSIS — D539 Nutritional anemia, unspecified: Secondary | ICD-10-CM | POA: Diagnosis not present

## 2023-01-10 DIAGNOSIS — E869 Volume depletion, unspecified: Secondary | ICD-10-CM | POA: Diagnosis not present

## 2023-01-10 DIAGNOSIS — Z7984 Long term (current) use of oral hypoglycemic drugs: Secondary | ICD-10-CM | POA: Diagnosis not present

## 2023-01-10 DIAGNOSIS — Z9641 Presence of insulin pump (external) (internal): Secondary | ICD-10-CM | POA: Diagnosis not present

## 2023-01-10 DIAGNOSIS — K219 Gastro-esophageal reflux disease without esophagitis: Secondary | ICD-10-CM | POA: Diagnosis not present

## 2023-01-10 DIAGNOSIS — Z79899 Other long term (current) drug therapy: Secondary | ICD-10-CM | POA: Diagnosis not present

## 2023-01-10 DIAGNOSIS — E860 Dehydration: Secondary | ICD-10-CM | POA: Diagnosis not present

## 2023-01-10 DIAGNOSIS — E872 Acidosis, unspecified: Secondary | ICD-10-CM | POA: Diagnosis not present

## 2023-01-10 DIAGNOSIS — K2980 Duodenitis without bleeding: Secondary | ICD-10-CM | POA: Diagnosis not present

## 2023-01-10 DIAGNOSIS — Z86718 Personal history of other venous thrombosis and embolism: Secondary | ICD-10-CM | POA: Diagnosis not present

## 2023-01-10 DIAGNOSIS — E876 Hypokalemia: Secondary | ICD-10-CM | POA: Diagnosis not present

## 2023-01-10 DIAGNOSIS — I480 Paroxysmal atrial fibrillation: Secondary | ICD-10-CM | POA: Diagnosis not present

## 2023-01-10 LAB — CBC
HCT: 28.3 % — ABNORMAL LOW (ref 36.0–46.0)
Hemoglobin: 9.6 g/dL — ABNORMAL LOW (ref 12.0–15.0)
MCH: 30.2 pg (ref 26.0–34.0)
MCHC: 33.9 g/dL (ref 30.0–36.0)
MCV: 89 fL (ref 80.0–100.0)
Platelets: 256 10*3/uL (ref 150–400)
RBC: 3.18 MIL/uL — ABNORMAL LOW (ref 3.87–5.11)
RDW: 13.9 % (ref 11.5–15.5)
WBC: 3 10*3/uL — ABNORMAL LOW (ref 4.0–10.5)
nRBC: 0 % (ref 0.0–0.2)

## 2023-01-10 LAB — TSH: TSH: 1.289 u[IU]/mL (ref 0.350–4.500)

## 2023-01-10 LAB — GLUCOSE, CAPILLARY
Glucose-Capillary: 92 mg/dL (ref 70–99)
Glucose-Capillary: 140 mg/dL — ABNORMAL HIGH (ref 70–99)
Glucose-Capillary: 90 mg/dL (ref 70–99)
Glucose-Capillary: 126 mg/dL — ABNORMAL HIGH (ref 70–99)
Glucose-Capillary: 61 mg/dL — ABNORMAL LOW (ref 70–99)
Glucose-Capillary: 65 mg/dL — ABNORMAL LOW (ref 70–99)
Glucose-Capillary: 73 mg/dL (ref 70–99)
Glucose-Capillary: 87 mg/dL (ref 70–99)

## 2023-01-10 LAB — PHOSPHORUS: Phosphorus: 1.7 mg/dL — ABNORMAL LOW (ref 2.5–4.6)

## 2023-01-10 LAB — MAGNESIUM: Magnesium: 1.9 mg/dL (ref 1.7–2.4)

## 2023-01-10 LAB — BASIC METABOLIC PANEL
Anion gap: 6 (ref 5–15)
BUN: 29 mg/dL — ABNORMAL HIGH (ref 8–23)
CO2: 21 mmol/L — ABNORMAL LOW (ref 22–32)
Calcium: 8.3 mg/dL — ABNORMAL LOW (ref 8.9–10.3)
Chloride: 113 mmol/L — ABNORMAL HIGH (ref 98–111)
Creatinine, Ser: 0.99 mg/dL (ref 0.44–1.00)
GFR, Estimated: 57 mL/min — ABNORMAL LOW (ref >60)
Glucose, Bld: 117 mg/dL — ABNORMAL HIGH (ref 70–99)
Potassium: 3.4 mmol/L — ABNORMAL LOW (ref 3.5–5.1)
Sodium: 140 mmol/L (ref 135–145)

## 2023-01-10 LAB — GASTROINTESTINAL PANEL BY PCR, STOOL (REPLACES STOOL CULTURE)

## 2023-01-10 LAB — CBG MONITORING, ED: Glucose-Capillary: 109 mg/dL — ABNORMAL HIGH (ref 70–99)

## 2023-01-10 MED ORDER — SIMVASTATIN 20 MG PO TABS
20.0000 mg | ORAL_TABLET | Freq: Every day | ORAL | Status: DC
Start: 2023-01-10 — End: 2023-01-17
  Administered 2023-01-10 – 2023-01-16 (×7): 20 mg via ORAL
  Filled 2023-01-10 (×7): qty 1

## 2023-01-10 MED ORDER — DILTIAZEM HCL ER COATED BEADS 180 MG PO CP24
180.0000 mg | ORAL_CAPSULE | Freq: Every day | ORAL | Status: DC
  Administered 2023-01-10 – 2023-01-17 (×8): 180 mg via ORAL
  Filled 2023-01-10 (×8): qty 1

## 2023-01-10 MED ORDER — INSULIN PUMP
Freq: Three times a day (TID) | SUBCUTANEOUS | Status: DC
  Administered 2023-01-15: 6.1 via SUBCUTANEOUS
  Administered 2023-01-17: 5.15 via SUBCUTANEOUS
  Filled 2023-01-10 (×2): qty 1

## 2023-01-10 MED ORDER — POTASSIUM PHOSPHATES 15 MMOLE/5ML IV SOLN
30.0000 mmol | Freq: Once | INTRAVENOUS | Status: AC
  Administered 2023-01-10: 30 mmol via INTRAVENOUS
  Filled 2023-01-10: qty 10

## 2023-01-10 MED ORDER — FLECAINIDE ACETATE 50 MG PO TABS
50.0000 mg | ORAL_TABLET | Freq: Every day | ORAL | Status: DC
  Administered 2023-01-10 – 2023-01-17 (×8): 50 mg via ORAL
  Filled 2023-01-10 (×8): qty 1

## 2023-01-10 MED ORDER — RIVAROXABAN 20 MG PO TABS
20.0000 mg | ORAL_TABLET | Freq: Every day | ORAL | Status: DC
  Administered 2023-01-10: 20 mg via ORAL
  Filled 2023-01-10: qty 1

## 2023-01-10 MED ORDER — POTASSIUM CHLORIDE CRYS ER 20 MEQ PO TBCR
40.0000 meq | EXTENDED_RELEASE_TABLET | Freq: Once | ORAL | Status: AC
  Administered 2023-01-10: 40 meq via ORAL
  Filled 2023-01-10: qty 2

## 2023-01-10 MED ORDER — PANTOPRAZOLE SODIUM 40 MG PO TBEC
40.0000 mg | DELAYED_RELEASE_TABLET | Freq: Every day | ORAL | Status: DC
Start: 2023-01-10 — End: 2023-01-13
  Administered 2023-01-10 – 2023-01-12 (×3): 40 mg via ORAL
  Filled 2023-01-10 (×3): qty 1

## 2023-01-10 MED ORDER — LACTATED RINGERS IV SOLN: INTRAVENOUS | Status: AC

## 2023-01-10 MED ORDER — ACETAMINOPHEN 500 MG PO TABS
1000.0000 mg | ORAL_TABLET | Freq: Four times a day (QID) | ORAL | Status: DC | PRN
  Filled 2023-01-10: qty 2

## 2023-01-10 MED ORDER — FLECAINIDE ACETATE 100 MG PO TABS
100.0000 mg | ORAL_TABLET | Freq: Every day | ORAL | Status: DC
  Administered 2023-01-10 – 2023-01-16 (×7): 100 mg via ORAL
  Filled 2023-01-10 (×8): qty 1

## 2023-01-10 MED ORDER — SODIUM CHLORIDE 0.9% FLUSH
3.0000 mL | Freq: Two times a day (BID) | INTRAVENOUS | Status: DC
  Administered 2023-01-10 – 2023-01-16 (×13): 3 mL via INTRAVENOUS

## 2023-01-10 NOTE — ED Notes (Signed)
[facility] here for transport.  

## 2023-01-10 NOTE — Progress Notes (Signed)
Pt has arrived to [address]. Alert and oriented x4. Identified appropriately. VS stable, pt in no distress, denied SOB and chest pain. Admission notified of pt arrival to the floor. Pt oriented to room and equipment, instructed to use call bell for assistance, and call bell left within pt reach.

## 2023-01-10 NOTE — Care Management Obs Status (Signed)
MEDICARE OBSERVATION STATUS NOTIFICATION   Patient Details  Name: Donna Wiley MRN: [id number] Date of Birth: Oct 31, 1940   Medicare Observation Status Notification Given:  Yes    Kingsley Plan, RN 01/10/2023, 3:35 PM

## 2023-01-10 NOTE — [facility] Cancer Survivorship Care Plan (Signed)
This 82 years old female with PMH significant of type 1 diabetes on insulin pump, A-fib on anticoagulation, aortic stenosis, history of IJ thrombus, GERD, iron deficiency anemia, chronic mesenteric stranding and nodules presented in the ED with c/o : 3 weeks of abdominal discomfort associated with diarrhea.  She is found to have acute kidney injury on labs.  Patient recently visited [place] 3 weeks ago for Thanksgiving holiday.  She has developed acute watery diarrhea,  denies any blood in the stools.  Patient is admitted for further evaluation and started on IV hydration.  GI panel is pending.

## 2023-01-10 NOTE — Inpatient Diabetes Management (Signed)
Inpatient Diabetes Program Recommendations  AACE/ADA: New Consensus Statement on Inpatient Glycemic Control (2015)  Target Ranges:  Prepandial:   less than 140 mg/dL      Peak postprandial:   less than 180 mg/dL (1-2 hours)      Critically ill patients:  140 - 180 mg/dL   Lab Results  Component Value Date   GLUCAP 140 (H) 01/10/2023    Review of Glycemic Control  Diabetes history: DM1 Outpatient Diabetes medications: OmniPod insulin pump with Humalog Current orders for Inpatient glycemic control: Insulin pump order set  MN .35 MN 10 MN 45-->40 MN 140  0300 .4 0600 3.3 0600 40-->35 120  0700 .55 1100 3.7 140  1400 .3 1700 8.5  1800 .5   Total Basal insulin = 10.7 units/day.  Auto basal insulin = N/a Active insulin time = 4   CBGs today: 92, 117, 140. Endo: Dr Roanna Raider. Last appt with Diabetes Educator - 01/02/2023  Inpatient Diabetes Program Recommendations:    Insulin pump ordered and pt is managing her own pump while inpatient. Insulin pump assessment has been completed by RN.  Will f/u in am.   Thank you. Ailene Ards, RD, LDN, CDCES Inpatient Diabetes Coordinator [phone]

## 2023-01-10 NOTE — H&P (Signed)
History and Physical    Donna Wiley ZOX:[id number] DOB: [birth year] DOA: 01/09/2023  PCP: Raquel James, MD   Patient coming from:  Transfer from [facility] ED   Chief Complaint:  Chief Complaint  Patient presents with   Diarrhea    HPI:  Donna Wiley is a 82 y.o. female with hx of type 1 diabetes on insulin pump, A-fib on anticoagulation, aortic stenosis, history IJ thrombus, CVT, GERD, duodenitis, IDA, chronic mesenteric stranding and nodules, who is transferred from [facility] ED for 3 weeks of diarrhea associated with AKI.  Patient reports she is traveling to the [employer] 3 weeks ago for Thanksgiving holiday.  Denies any unusual food exposures or other sick contacts with GI illness.  Developed acute onset of watery diarrhea 6-10 episodes per day, nonbloody, which has continued to present.  Due to frequent stooling noted rare scant blood only on the toilet paper.  Has associated abdominal cramping at that time that she needs to move her bowels, otherwise painless.  No nausea/vomiting.  Trying to take an oral intake although notes this causes more diarrhea.  Feels dehydrated, generalized weakness.  No recent antibiotics. no fever/chills, cough cold symptoms or other complaints.    Review of Systems:  ROS complete and negative except as marked above   Allergies  Allergen Reactions   Iodine Anaphylaxis   Nickel Rash   Charentais Melon (French Melon)    Cherry Swelling   Codeine    Ivp Dye [Iodinated Contrast Media]    Other     Tree nuts    Shellfish Allergy    Sulfa Antibiotics    Tape Dermatitis    Paper tape is best PAPER TAPE     Prior to Admission medications   Medication Sig Start Date End Date Taking? Authorizing Provider  b complex vitamins capsule Take 1 capsule by mouth daily.    [provider]  calcium citrate (CALCITRATE - DOSED IN MG ELEMENTAL CALCIUM) 950 (200 Ca) MG tablet Take 500 mg of elemental calcium by mouth daily.    [provider]  cetirizine (ZYRTEC) 10 MG tablet Take 1 tablet by mouth daily.    [provider]  clobetasol cream (TEMOVATE) 0.05 % Apply topically 2 (two) times daily. 09/15/21   [provider]  clobetasol cream (TEMOVATE) 0.05 % Apply 1 Application topically 2 (two) times daily. 10/31/22   Ferol Luz, MD  diltiazem (CARDIZEM CD) 180 MG 24 hr capsule Take 180 mg by mouth daily.      [provider]  EPINEPHrine 0.3 mg/0.3 mL IJ SOAJ injection Inject into the muscle. 06/21/21   [provider]  flunisolide (NASALIDE) 25 MCG/ACT (0.025%) SOLN Place 2 sprays into the nose 2 (two) times daily. 04/05/22   Ferol Luz, MD  hydrOXYzine (ATARAX) 10 MG tablet Take 10 mg by mouth 4 (four) times daily. 06/21/21   [provider]  Insulin Disposable Pump (OMNIPOD DASH PODS, GEN 4,) MISC Inject into the skin. Patient not taking: Reported on 10/05/2021 09/03/21   [provider]  insulin lispro (HUMALOG) 100 UNIT/ML injection Inject into the skin 3 (three) times daily before meals.    [provider]  levocetirizine (XYZAL) 5 MG tablet Take 1 tablet (5 mg total) by mouth every evening. 10/05/21   Ferol Luz, MD  LOSARTAN POTASSIUM-HCTZ PO Take by mouth. Patient not taking: Reported on 07/05/2021    [provider]  losartan-hydrochlorothiazide (HYZAAR) 100-12.5 MG tablet losartan 100 mg-hydrochlorothiazide 12.5 mg  tablet    [provider]  losartan-hydrochlorothiazide (HYZAAR) 50-12.5 MG tablet Take 1 tablet by mouth daily.    [provider]  metFORMIN (GLUCOPHAGE) 500 MG tablet Take by mouth daily. Patient not taking: Reported on 07/05/2021    [provider]  mirabegron ER (MYRBETRIQ) 50 MG TB24 tablet Take 50 mg by mouth daily. Patient not taking: Reported on 07/05/2021    [provider]  mirabegron ER (MYRBETRIQ) 50 MG TB24 tablet Take 1 tablet by mouth daily. 07/13/21 10/11/21  [provider]  mupirocin ointment (BACTROBAN) 2 %     [provider]  olmesartan-hydrochlorothiazide (BENICAR HCT) 40-12.5 MG tablet Take 1 tablet by mouth daily. Patient not taking: Reported on 10/31/2022 02/17/21   [provider]  ONETOUCH ULTRA test strip USE TO CHECK BLOOD SUGAR 7 TIMES DAILY 09/04/21   [provider]  pantoprazole (PROTONIX) 40 MG tablet Take 40 mg by mouth daily. 06/02/21   [provider]  rivaroxaban (XARELTO) 20 MG TABS tablet Take by mouth.      [provider]  simvastatin (ZOCOR) 20 MG tablet Take 20 mg by mouth at bedtime.      [provider]  simvastatin (ZOCOR) 40 MG tablet Take 0.5 tablets by mouth daily.    [provider]  tolterodine (DETROL LA) 4 MG 24 hr capsule Take 4 mg by mouth daily. Patient not taking: Reported on 10/05/2021 06/24/21   [provider]    Past Medical History:  Diagnosis Date   Arthritis    Asthma    childhood   Atrial fibrillation (HCC)    Cystocele    Diabetes mellitus    GERD (gastroesophageal reflux disease)    High cholesterol    Kidney stone    Rectocele    SVT (supraventricular tachycardia) (HCC)     Past Surgical History:  Procedure Laterality Date   ABDOMINAL HYSTERECTOMY     APPENDECTOMY     CATARACT EXTRACTION     CHOLECYSTECTOMY     FOOT SURGERY     HAND SURGERY     ROTATOR CUFF REPAIR       reports that she has never smoked. She has never been exposed to tobacco smoke. She has never used smokeless tobacco. She reports that she does not drink alcohol and does not use drugs.  Family History  Problem Relation Age of Onset   Allergic rhinitis Daughter    Asthma Daughter    Lupus Daughter    Scoliosis Daughter    Breast cancer Neg Hx    Urticaria Neg Hx    Immunodeficiency Neg Hx    Angioedema Neg Hx    Atopy Neg Hx    Eczema Neg Hx      Physical Exam: Vitals:   01/09/23 1746 01/09/23 2055 01/09/23 2150 01/10/23 0106  BP: (!)  144/69 139/63 131/63 (!) 142/56  Pulse: (!) 103 98 84 82  Resp: 18 16 14 14   Temp: 99 F (37.2 C)  98.1 F (36.7 C) 98.2 F (36.8 C)  TempSrc: Oral  Oral Oral  SpO2: 99% 98% 100% 100%  Weight:      Height:        Gen: Awake, alert, NAD, well-appearing CV: Regular, normal S1, S2, no murmurs  Resp: Normal WOB, CTAB  Abd: Flat, hyperactive, mild diffuse tenderness, no rebound, guarding, rigidity.   MSK: Symmetric, 1+ pitting edema tapers off at one third up lower leg Skin: No rashes or lesions  to exposed skin  Neuro: Alert and interactive  Psych: euthymic, appropriate    Data review:   Labs reviewed, notable for:   Lactate 0.8 K3.1, bicarb 18, anion gap 10, creatinine 1.29, baseline unclear limited data 0.7-1.1 possibly WBC 4, hemoglobin 11, normocytic UA with hyaline casts and ketones.  Micro:  Results for orders placed or performed during the hospital encounter of 01/09/23  C Difficile Quick Screen w PCR reflex     Status: None   Collection Time: 01/09/23  9:28 PM  Result Value Ref Range Status   C Diff antigen NEGATIVE NEGATIVE Final   C Diff toxin NEGATIVE NEGATIVE Final   C Diff interpretation No C. difficile detected.  Final    Comment: Performed at [facility], [address] [place], [place] [address]    Imaging reviewed:   OSH CT 12/6  01/06/2023  Formatting of this note might be different from the original. CLINICAL DATA: Epigastric pain.  EXAM: CT ABDOMEN AND PELVIS WITH CONTRAST  TECHNIQUE: Multidetector CT imaging of the abdomen and pelvis was performed using the standard protocol following bolus administration of intravenous contrast.  RADIATION DOSE REDUCTION: This exam was performed according to the departmental dose-optimization program which includes automated exposure control, adjustment of the mA and/or kV according to patient size and/or use of iterative reconstruction technique.  CONTRAST: 80 mL iohexoL (OMNIPAQUE) 350 mg  iodine/mL injection (MDV) 80 mL  COMPARISON: CT without contrast 01/04/2023 and older.  FINDINGS: Lower chest: There is some linear opacity lung bases likely scar or atelectasis. No pleural effusion.  Hepatobiliary: No focal liver abnormality is seen. Status post cholecystectomy. No biliary dilatation. Patent portal vein.  Pancreas: Moderate atrophy of the pancreas.  Spleen: Normal in size without focal abnormality.  Adrenals/Urinary Tract: Adrenal glands are unremarkable. Kidneys are normal, without renal calculi, focal lesion, or hydronephrosis. Bladder is unremarkable.  Stomach/Bowel: Oral contrast was administered. Overall the bowel is nondilated. Scattered colonic stool. Slightly redundant course of the sigmoid colon and transverse colon. Stomach is collapsed. Appendix is not clearly seen in the right lower quadrant but no pericecal stranding or fluid.  Vascular/Lymphatic: Scattered vascular calcifications along the aorta and branch vessels. Normal caliber aorta and IVC. No discrete lymph node enlargement identified in the abdomen and pelvis. There is moderate stranding of the central mesentery with some small nodes as described on the recent prior. Changes did exist on remote studies going back 2017 but are slightly increased relatively those remote examinations.  Reproductive: Status post hysterectomy. No adnexal masses.  Other: No free intra-air. Small epigastric fat containing midline anterior abdominal wall hernia. No well-defined fluid collections.  Musculoskeletal: Degenerative changes seen along the spine and pelvis. Slight curvature of the spine. Areas of disc bulging. Moderate compression of T11 with sclerotic margins, unchanged from previous.  IMPRESSION: Moderate central mesenteric stranding with some small nodes. Progressive from older examinations. No splenomegaly or pathologic nodal enlargement. Patent mesenteric vessels. There is a  broad differential. Recommend follow up in 3-6 months to assess stability.   ED Course:  Treated with 1 L IV fluid, 30 mEq KCl, called for admission due to diarrheal illness associated with AKI.  Transferred from [facility] ED   Assessment/Plan:  82 y.o. female with hx type 1 diabetes on insulin pump, A-fib on anticoagulation, aortic stenosis, HTN, HLD, GERD, duodenitis, IDA, history IJ thrombus, CVT, chronic mesenteric stranding and nodules , who is transferred from [facility] ED for 3 weeks of diarrhea associated with AKI.  Acute gastroenteritis Most likely bacterial gastroenteritis in the setting of travel and acute onset diarrhea. Had recent CT abdomen pelvis after onset of symptoms on 12/6 with finding of moderate mesenteric stranding and nodules progressive and chronic noted since at least 2017.  No other findings of colitis.  Reports she had another CT prior to this which showed some colonic thickening, although result not available that I can locate. C. difficile is negative.  GI pathogen pending.  - Follow-up GI pathogen panel, if negative okay to start on loperamide - Status post 1 L IV fluid at outside ED, continue mIVF LR 75 cc/h x 13 hours then reassess need for IV fluid. - Check fecal calprotectin with her findings of chronic mesenteric stranding and nodules - If infectious workup negative consider GI evaluation, colonoscopy.  Reports last colonoscopy in 2023 which was normal per her report.   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  Elevated to 1.29 on admission.  Unclear if this is AKI versus CKD.  Suspect prerenal AKI in setting of diarrheal illness. - IV fluids per above, trend renal function -Hold her olmesartan-HCTZ due to AKI and volume depletion  Incidental finding progressive mesenteric stranding and nodules CT on 12/6 with Moderate central mesenteric stranding with some small nodes. Progressive from older examinations  (noted since at least 2017). No splenomegaly or pathologic nodal enlargement. Recommending f/u CT in 3-6 months  -Need to discuss this further with the patient I did not discuss during my initial evaluation -Should have GI follow-up possible colonoscopy.   -Consider referral to heme-onc given chronicity and progression of mesenteric nodules -Follow-up CT in 3 to 6 months  NAGMA, secondary to diarrhea - Supportive care per above  Hypokalemia -Repleted  Chronic medical problems: Type 1 diabetes on insulin pump: Patient to manage her own insulin pump while inpatient, ordered set entered.  Continue serial CBG checks A-fib on anticoagulation: Continue home flecainide twice daily, diltiazem, Xarelto nightly AS: Outpatient monitoring  Hypertension: Continue home diltiazem, hold olmesartan/HCTZ due to AKI and volume depletion. HLD: Continue home statin GERD/duodenitis: Continue home PPI History IJ thrombus, CVD: Anticoagulation per above  Body mass index is 23.78 kg/m.    DVT prophylaxis:  Xarelto Code Status:  Full Code Diet:  Diet Orders (From admission, onward)     Start     Ordered   01/10/23 0205  Diet Carb Modified Fluid consistency: Thin; Room service appropriate? Yes  Diet effective now       Question Answer Comment  Diet-HS Snack? Nothing   Calorie Level Medium 1600-2000   Fluid consistency: Thin   Room service appropriate? Yes      01/10/23 0206           Family Communication:  No   Consults:  None   Admission status:   Observation, Telemetry bed  Severity of Illness: The appropriate patient status for this patient is OBSERVATION. Observation status is judged to be reasonable and necessary in order to provide the required intensity of service to ensure the patient's safety. The patient's presenting symptoms, physical exam findings, and initial radiographic and laboratory data in the context of their medical condition is felt to place them at decreased risk for  further clinical deterioration. Furthermore, it is anticipated that the patient will be medically stable for discharge from the hospital within 2 midnights of admission.    Dolly Rias, MD [facility]  How to contact the [facility] Attending or Consulting  provider 7A - 7P or covering provider during after hours 7P -7A, for this patient.  Check the care team in [facility] and look for a) attending/consulting TRH provider listed and b) the [facility] team listed Log into [email or web address] and use [facility]'s universal password to access. If you do not have the password, please contact the hospital operator. Locate the [facility] provider you are looking for under [facility] and page to a number that you can be directly reached. If you still have difficulty reaching the provider, please page the [facility] (Director on Call) for the Hospitalists listed on amion for assistance.  01/10/2023, 3:19 AM

## 2023-01-10 NOTE — TOC Initial Note (Signed)
Transition of Care (TOC) - Initial/Assessment Note   Spoke to patient at bedside. Patient from home alone. Daughter lives close by.   Patient independent prior to admission. Volunteers at her church. Has transportation .   Await PT evaluation  Patient Details  Name: Donna Wiley MRN: [id number] Date of Birth: 12-21-1940  Transition of Care [facility]) CM/SW Contact:    Kingsley Plan, RN Phone Number: 01/10/2023, 3:44 PM  Clinical Narrative:                   Expected Discharge Plan:  (await PT recommendations)     Patient Goals and CMS Choice Patient states their goals for this hospitalization and ongoing recovery are:: to return to home          Expected Discharge Plan and Services   Discharge Planning Services: CM Consult Post Acute Care Choice:  (Await PT recommendation) Living arrangements for the past 2 months: Single Family Home                 DME Arranged:  (Await PT recommendation)         HH Arranged:  (Await PT recommendations) HH Agency:  (Await PT recommendation s)        Prior Living Arrangements/Services Living arrangements for the past 2 months: Single Family Home Lives with:: Self Patient language and need for interpreter reviewed:: Yes Do you feel safe going back to the place where you live?: Yes      Need for Family Participation in Patient Care: No (Comment) (await PT recommendation) Care giver support system in place?: Yes (comment)   Criminal Activity/Legal Involvement Pertinent to Current Situation/Hospitalization: No - Comment as needed  Activities of Daily Living   ADL Screening (condition at time of admission) Independently performs ADLs?: Yes (appropriate for developmental age) Is the patient deaf or have difficulty hearing?: No Does the patient have difficulty seeing, even when wearing glasses/contacts?: No Does the patient have difficulty concentrating, remembering, or making decisions?: No  Permission  Sought/Granted   Permission granted to share information with : No              Emotional Assessment Appearance:: Appears stated age Attitude/Demeanor/Rapport: Engaged Affect (typically observed): Accepting Orientation: : Oriented to Self, Oriented to Place, Oriented to  Time, Oriented to Situation Alcohol / Substance Use: Not Applicable Psych Involvement: No (comment)  Admission diagnosis:  Diarrhea [R19.7] Diarrhea, unspecified type [R19.7] Patient Active Problem List   Diagnosis Date Noted   Gastroenteritis 01/10/2023   Mesenteric lymphadenopathy 01/10/2023   Stage 1 acute kidney injury (HCC) 01/10/2023   Diarrhea 01/09/2023   PCP:  Raquel James, MD Pharmacy:   [facility] [phone] Pura Spice, [place] - [address] AT [facility] OF [address] [place] [address] Phone: [phone] Fax: [phone]     Social Determinants of Health (SDOH) Social History: SDOH Screenings   Food Insecurity: No Food Insecurity (01/10/2023)  Housing: Low Risk  (01/10/2023)  Transportation Needs: No Transportation Needs (01/10/2023)  Utilities: Not At Risk (01/10/2023)  Tobacco Use: Low Risk  (01/09/2023)   SDOH Interventions:     Readmission Risk Interventions     No data to display

## 2023-01-10 NOTE — Evaluation (Signed)
Physical Therapy Evaluation and DIscharge Patient Details Name: Donna Wiley MRN: [id number] DOB: October 26, 1940 Today's Date: 01/10/2023  History of Present Illness  Pt is 82 yo female who presents to [facility] on 01/09/23 with 3 wks diarrhea associated with AKI and acute gastroenteritis. PMH: DM1, Afib, GERD, IDA, chronic mesenteric stranding and nodules, duodenitis, CVT, internal jugular thrombus.  Clinical Impression  Patient evaluated by Physical Therapy with no further acute PT needs identified. All education has been completed and the patient has no further questions. Pt independent with mobility without need for AD or assist. Only current limitation is needing to stay near the bathroom because of continued diarrhea. Discussed resumption of regular activity as tolerated to prevent weakness. Pt active and independent at baseline. Pt voiced understanding.  See below for any follow-up Physical Therapy or equipment needs. PT is signing off. Thank you for this referral.         If plan is discharge home, recommend the following:     Can travel by private vehicle        Equipment Recommendations None recommended by PT  Recommendations for Other Services       Functional Status Assessment Patient has not had a recent decline in their functional status     Precautions / Restrictions Precautions Precautions: None Restrictions Weight Bearing Restrictions: No      Mobility  Bed Mobility Overal bed mobility: Independent                  Transfers Overall transfer level: Independent Equipment used: None                    Ambulation/Gait Ambulation/Gait assistance: Independent Gait Distance (Feet): 40 Feet Assistive device: None Gait Pattern/deviations: WFL(Within Functional Limits) Gait velocity: WFL Gait velocity interpretation: >4.37 ft/sec, indicative of normal walking speed   General Gait Details: pt independent with ambulation in room, only limitation  is frequent diarrhea that has persisted.  Stairs            Wheelchair Mobility     Tilt Bed    Modified Rankin (Stroke Patients Only)       Balance Overall balance assessment: No apparent balance deficits (not formally assessed)                                           Pertinent Vitals/Pain Pain Assessment Pain Assessment: Faces Faces Pain Scale: Hurts a little bit Pain Location: abdomen Pain Descriptors / Indicators: Sore Pain Intervention(s): Limited activity within patient's tolerance    Home Living Family/patient expects to be discharged to:: Private residence Living Arrangements: Alone Available Help at Discharge: Family;Available PRN/intermittently Type of Home: House Home Access: Stairs to enter Entrance Stairs-Rails: Right Entrance Stairs-Number of Steps: 2   Home Layout: One level Home Equipment: None Additional Comments: pt's daughter lives 5 mins away. Pt very active, takes care of yard, does photography, etc    Prior Function Prior Level of Function : Independent/Modified Independent;Driving                     Extremity/Trunk Assessment   Upper Extremity Assessment Upper Extremity Assessment: Overall WFL for tasks assessed    Lower Extremity Assessment Lower Extremity Assessment: Overall WFL for tasks assessed    Cervical / Trunk Assessment Cervical / Trunk Assessment: Normal  Communication   Communication Communication: No apparent  difficulties Cueing Techniques: Verbal cues  Cognition Arousal: Alert Behavior During Therapy: WFL for tasks assessed/performed Overall Cognitive Status: Within Functional Limits for tasks assessed                                          General Comments General comments (skin integrity, edema, etc.): VSS on RA. DIscussed pt walking in hallway as able in regards to diarrhea. Stayed close to bathroom on eval as she had just needed it prior    Exercises      Assessment/Plan    PT Assessment Patient does not need any further PT services  PT Problem List         PT Treatment Interventions      PT Goals (Current goals can be found in the Care Plan section)  Acute Rehab PT Goals Patient Stated Goal: diarrhea to stop PT Goal Formulation: All assessment and education complete, DC therapy    Frequency       Co-evaluation               AM-PAC PT "6 Clicks" Mobility  Outcome Measure Help needed turning from your back to your side while in a flat bed without using bedrails?: None Help needed moving from lying on your back to sitting on the side of a flat bed without using bedrails?: None Help needed moving to and from a bed to a chair (including a wheelchair)?: None Help needed standing up from a chair using your arms (e.g., wheelchair or bedside chair)?: None Help needed to walk in hospital room?: None Help needed climbing 3-5 steps with a railing? : None 6 Click Score: 24    End of Session Equipment Utilized During Treatment: Gait belt Activity Tolerance: Patient tolerated treatment well Patient left: in bed;with call bell/phone within reach Nurse Communication: Mobility status PT Visit Diagnosis: Pain Pain - part of body:  (abdomen)    Time: 1610-9604 PT Time Calculation (min) (ACUTE ONLY): 16 min   Charges:   PT Evaluation $PT Eval Low Complexity: 1 Low   PT General Charges $$ ACUTE PT VISIT: 1 Visit         Lyanne Co, PT  Acute Rehab Services Secure chat preferred Office [phone]   Lawana Chambers Tamey Wanek 01/10/2023, 4:51 PM

## 2023-01-11 DIAGNOSIS — Z7901 Long term (current) use of anticoagulants: Secondary | ICD-10-CM | POA: Diagnosis not present

## 2023-01-11 DIAGNOSIS — I4891 Unspecified atrial fibrillation: Secondary | ICD-10-CM | POA: Diagnosis not present

## 2023-01-11 DIAGNOSIS — R932 Abnormal findings on diagnostic imaging of liver and biliary tract: Secondary | ICD-10-CM

## 2023-01-11 DIAGNOSIS — L309 Dermatitis, unspecified: Secondary | ICD-10-CM

## 2023-01-11 DIAGNOSIS — K644 Residual hemorrhoidal skin tags: Secondary | ICD-10-CM

## 2023-01-11 DIAGNOSIS — R197 Diarrhea, unspecified: Secondary | ICD-10-CM | POA: Diagnosis not present

## 2023-01-11 LAB — CBC
HCT: 29.3 % — ABNORMAL LOW (ref 36.0–46.0)
Hemoglobin: 9.6 g/dL — ABNORMAL LOW (ref 12.0–15.0)
MCH: 29.7 pg (ref 26.0–34.0)
MCHC: 32.8 g/dL (ref 30.0–36.0)
MCV: 90.7 fL (ref 80.0–100.0)
Platelets: 241 10*3/uL (ref 150–400)
RBC: 3.23 MIL/uL — ABNORMAL LOW (ref 3.87–5.11)
RDW: 14.3 % (ref 11.5–15.5)
WBC: 3.9 10*3/uL — ABNORMAL LOW (ref 4.0–10.5)
nRBC: 0 % (ref 0.0–0.2)

## 2023-01-11 LAB — CBC WITH DIFFERENTIAL/PLATELET
Abs Immature Granulocytes: 0.02 10*3/uL (ref 0.00–0.07)
Basophils Absolute: 0 10*3/uL (ref 0.0–0.1)
Basophils Relative: 1 %
Eosinophils Absolute: 0.1 10*3/uL (ref 0.0–0.5)
Eosinophils Relative: 2 %
HCT: 32.6 % — ABNORMAL LOW (ref 36.0–46.0)
Hemoglobin: 10.9 g/dL — ABNORMAL LOW (ref 12.0–15.0)
Immature Granulocytes: 1 %
Lymphocytes Relative: 20 %
Lymphs Abs: 0.9 10*3/uL (ref 0.7–4.0)
MCH: 30.2 pg (ref 26.0–34.0)
MCHC: 33.4 g/dL (ref 30.0–36.0)
MCV: 90.3 fL (ref 80.0–100.0)
Monocytes Absolute: 0.5 10*3/uL (ref 0.1–1.0)
Monocytes Relative: 11 %
Neutro Abs: 2.9 10*3/uL (ref 1.7–7.7)
Neutrophils Relative %: 65 %
Platelets: 267 10*3/uL (ref 150–400)
RBC: 3.61 MIL/uL — ABNORMAL LOW (ref 3.87–5.11)
RDW: 14.2 % (ref 11.5–15.5)
WBC: 4.4 10*3/uL (ref 4.0–10.5)
nRBC: 0 % (ref 0.0–0.2)

## 2023-01-11 LAB — GLUCOSE, CAPILLARY
Glucose-Capillary: 136 mg/dL — ABNORMAL HIGH (ref 70–99)
Glucose-Capillary: 139 mg/dL — ABNORMAL HIGH (ref 70–99)
Glucose-Capillary: 142 mg/dL — ABNORMAL HIGH (ref 70–99)
Glucose-Capillary: 143 mg/dL — ABNORMAL HIGH (ref 70–99)
Glucose-Capillary: 53 mg/dL — ABNORMAL LOW (ref 70–99)
Glucose-Capillary: 55 mg/dL — ABNORMAL LOW (ref 70–99)
Glucose-Capillary: 57 mg/dL — ABNORMAL LOW (ref 70–99)
Glucose-Capillary: 58 mg/dL — ABNORMAL LOW (ref 70–99)
Glucose-Capillary: 66 mg/dL — ABNORMAL LOW (ref 70–99)
Glucose-Capillary: 66 mg/dL — ABNORMAL LOW (ref 70–99)
Glucose-Capillary: 69 mg/dL — ABNORMAL LOW (ref 70–99)
Glucose-Capillary: 92 mg/dL (ref 70–99)
Glucose-Capillary: 94 mg/dL (ref 70–99)

## 2023-01-11 LAB — COMPREHENSIVE METABOLIC PANEL
ALT: 27 U/L (ref 0–44)
AST: 22 U/L (ref 15–41)
Albumin: 2.3 g/dL — ABNORMAL LOW (ref 3.5–5.0)
Alkaline Phosphatase: 35 U/L — ABNORMAL LOW (ref 38–126)
Anion gap: 2 — ABNORMAL LOW (ref 5–15)
BUN: 18 mg/dL (ref 8–23)
CO2: 17 mmol/L — ABNORMAL LOW (ref 22–32)
Calcium: 7.7 mg/dL — ABNORMAL LOW (ref 8.9–10.3)
Chloride: 120 mmol/L — ABNORMAL HIGH (ref 98–111)
Creatinine, Ser: 0.9 mg/dL (ref 0.44–1.00)
GFR, Estimated: >60 mL/min (ref >60)
Glucose, Bld: 152 mg/dL — ABNORMAL HIGH (ref 70–99)
Potassium: 3.7 mmol/L (ref 3.5–5.1)
Sodium: 139 mmol/L (ref 135–145)
Total Bilirubin: 0.4 mg/dL (ref <1.2)
Total Protein: 4.3 g/dL — ABNORMAL LOW (ref 6.5–8.1)

## 2023-01-11 LAB — MAGNESIUM: Magnesium: 1.8 mg/dL (ref 1.7–2.4)

## 2023-01-11 LAB — TECHNOLOGIST SMEAR REVIEW
Plt Morphology: ADEQUATE
WBC MORPHOLOGY: INCREASED

## 2023-01-11 LAB — HIV ANTIBODY (ROUTINE TESTING W REFLEX): HIV Screen 4th Generation wRfx: NONREACTIVE

## 2023-01-11 LAB — PHOSPHORUS: Phosphorus: 2 mg/dL — ABNORMAL LOW (ref 2.5–4.6)

## 2023-01-11 LAB — CALPROTECTIN, FECAL: Calprotectin, Fecal: 297 ug/g — ABNORMAL HIGH (ref 0–120)

## 2023-01-11 MED ORDER — LOPERAMIDE HCL 2 MG PO CAPS
2.0000 mg | ORAL_CAPSULE | Freq: Once | ORAL | Status: AC
  Administered 2023-01-11: 2 mg via ORAL
  Filled 2023-01-11: qty 1

## 2023-01-11 MED ORDER — LOPERAMIDE HCL 2 MG PO CAPS
2.0000 mg | ORAL_CAPSULE | Freq: Four times a day (QID) | ORAL | Status: DC
Start: 2023-01-11 — End: 2023-01-13
  Administered 2023-01-11 – 2023-01-12 (×2): 2 mg via ORAL
  Filled 2023-01-11 (×2): qty 1

## 2023-01-11 MED ORDER — LOPERAMIDE HCL 2 MG PO CAPS
2.0000 mg | ORAL_CAPSULE | ORAL | Status: DC | PRN
  Administered 2023-01-11: 2 mg via ORAL

## 2023-01-11 MED ORDER — HYDROCORTISONE (PERIANAL) 2.5 % EX CREA
TOPICAL_CREAM | Freq: Two times a day (BID) | CUTANEOUS | Status: DC
  Filled 2023-01-11: qty 28.35

## 2023-01-11 MED ORDER — DEXTROSE 50 % IV SOLN
25.0000 mL | Freq: Once | INTRAVENOUS | Status: AC
  Administered 2023-01-11: 25 mL via INTRAVENOUS
  Filled 2023-01-11: qty 50

## 2023-01-11 NOTE — Plan of Care (Signed)

## 2023-01-11 NOTE — Progress Notes (Signed)
Hypoglycemic Event  CBG: 61  Treatment:  a handful of M&M  chocolate, 8 oz juice/soda, 1  pack of graham crackers   Symptoms: None  Follow-up CBG: Time:2201 CBG Result:65 Follow up CBG: Time 2231    CBG Result: 73 Follow up CBG: Time: 2305    CBG Result: 87 Follow Up CBG: Time: 0123    CBG Result: 94  Possible Reasons for Event: Unknown  Comments/MD notified: Chinita Greenland NP- no new orders

## 2023-01-11 NOTE — Progress Notes (Signed)
PROGRESS NOTE    Donna Wiley  BJS:[id number] DOB: 1940/07/16 DOA: 01/09/2023 PCP: Raquel James, MD  Chief Complaint  Patient presents with   Diarrhea    Hospital Course:  Donna Wiley is 82 y.o. female with paroxysmal atrial fibrillation on Xarelto, type 2 diabetes, GERD, hypertension, deficiency anemia, aortic stenosis, who presents to the emergency department for evaluation of diarrhea and abdominal pain.  Patient has had diarrhea for the last 12 days.  She reports that is nonbloody but watery.  No nausea no vomiting.  She has had anorexia due to fear of going to the bathroom.  She denies any recent antibiotics or history of C. difficile.  Denies any sick contacts.  Patient initially presented to outside hospital 2 weeks ago after 3 days of acute watery diarrhea.  She reports workup was negative at that time.  Thus far her GI PCR and C. difficile have been negative.  Subjective: Patient reports she has already had 4 bowel movements today.  She just received her first dose of Imodium.  She reports when at rest she does not have abdominal pain but before bowel movement gets crampy painful sensation.  Objective: Vitals:   01/11/23 0320 01/11/23 0459 01/11/23 0729 01/11/23 1425  BP: (!) 134/58  (!) 141/67 124/64  Pulse: 74  69 78  Resp: 17  19 16   Temp: 98.2 F (36.8 C)  98 F (36.7 C) 98.4 F (36.9 C)  TempSrc: Oral  Oral Oral  SpO2: 100%  100% 100%  Weight:  60.6 kg    Height:        Intake/Output Summary (Last 24 hours) at 01/11/2023 1453 Last data filed at 01/11/2023 1300 Gross per 24 hour  Intake 1773.44 ml  Output --  Net 1773.44 ml   Filed Weights   01/09/23 1743 01/10/23 0500 01/11/23 0459  Weight: 59 kg 59.8 kg 60.6 kg    Examination: General exam: Appears calm and comfortable, NAD  Respiratory system: No work of breathing, symmetric chest wall expansion Cardiovascular system: S1 & S2 heard, RRR.  Gastrointestinal system: Abdomen is  nondistended, soft and diffusely tender to palpation.  No guarding Neuro: Alert and oriented. No focal neurological deficits. Extremities: Symmetric, expected ROM Skin: No rashes, lesions Psychiatry: Demonstrates appropriate judgement and insight. Mood & affect appropriate for situation.   Assessment & Plan:  Principal Problem:   Diarrhea Active Problems:   Gastroenteritis   Mesenteric lymphadenopathy   Stage 1 acute kidney injury (HCC)  Diarrhea - Initially thought to be bacterial gastroenteritis in the setting of travel and acute onset - CT abdomen pelvis 12/6: Moderate mesenteric stranding and nodules progressively chronic since at least 2017.  No other findings of colitis. - GI PCR negative - C. difficile negative - TSH, HIV, lipase all negative - O&P pending - Status post fluid resuscitation - Fecal calprotectin pending - Patient reports last colonoscopy was 2023 which was normal - Given persistence of diarrhea we will consult GI as patient may need additional endoscopy during this admission  Chronic mesenteric stranding and nodules - Seen on outside CT scan in 2017, redemonstrated on outside CT 12/6: Moderate central mesenteric stranding with some small nodules.  Progressing from older exams.  No splenomegaly or pathological nodal enlargement.  Recommending follow-up CT in 3 to 6 months.  Discussed this with patient directly. - Follow-up CT outpt  AKI - Appears baseline creatinine is 0.9, was 1.29 on admission - Suspect this is prerenal and in setting of diarrheal illness. -  Has resolved now.  Will continue to follow with daily CMP  Leukopenia - WBC 4.6 on arrival -> 3.0 -> 3.9 - Suspect secondary to infection as above, she remains afebrile - HIV negative - decreased RBCs -> proceed with peripheral smear.  Generalized weakness - Due to diarrhea and electrolyte deficiencies as above - PT/OT  Hypokalemia - Replace as needed - Due to diarrhea  Type 1 diabetes,  long-term use of insulin - On insulin pump, will continue with patient management of insulin pump - Continue CBG  Atrial fibrillation on anticoagulation - Continue home dose flecainide, diltiazem, Xarelto  Aortic stenosis - Continue outpatient monitoring  Hypertension - Continue home meds for now - Hold ARB/HCTZ due to AKI and volume depletion  Hyperlipidemia - Continue statin  GERD Duodenitis - Continue PPI  History of IJ thrombus Cardiovascular disease - Home meds as above  Iron deficiency anemia - Hgb stable for now - Ferrous sulfate       DVT prophylaxis: SCDs for now   Code Status: Full Code Family Communication: None at bedside. Discussed directly with pt Disposition:  Status is: Observation The patient remains OBS appropriate and will d/c before 2 midnights.    Consultants:  GI    Procedures:  N/a  Antimicrobials:  Anti-infectives (From admission, onward)    None       Data Reviewed: I have personally reviewed following labs and imaging studies CBC: Recent Labs  Lab 01/09/23 1753 01/10/23 0557 01/11/23 0547  WBC 4.6 3.0* 3.9*  HGB 11.6* 9.6* 9.6*  HCT 34.4* 28.3* 29.3*  MCV 88.4 89.0 90.7  PLT 367 256 241   Basic Metabolic Panel: Recent Labs  Lab 01/09/23 1753 01/09/23 2128 01/10/23 0557 01/11/23 0547  NA 135  --  140 139  K 3.1*  --  3.4* 3.7  CL 107  --  113* 120*  CO2 18*  --  21* 17*  GLUCOSE 166*  --  117* 152*  BUN 41*  --  29* 18  CREATININE 1.29*  --  0.99 0.90  CALCIUM 8.8*  --  8.3* 7.7*  MG  --  1.9 1.9 1.8  PHOS  --   --  1.7* 2.0*   GFR: Estimated Creatinine Clearance: 40.6 mL/min (by C-G formula based on SCr of 0.9 mg/dL). Liver Function Tests: Recent Labs  Lab 01/09/23 1753 01/11/23 0547  AST 35 22  ALT 41 27  ALKPHOS 44 35*  BILITOT 0.8 0.4  PROT 5.9* 4.3*  ALBUMIN 3.3* 2.3*   CBG: Recent Labs  Lab 01/11/23 0839 01/11/23 1139 01/11/23 1203 01/11/23 1226 01/11/23 1248  GLUCAP 136* 66* 57*  69* 92    Recent Results (from the past 240 hour(s))  Gastrointestinal Panel by PCR , Stool     Status: None   Collection Time: 01/09/23  9:28 PM   Specimen: Urine, Clean Catch; Stool  Result Value Ref Range Status   Campylobacter species NOT DETECTED NOT DETECTED Final   Plesimonas shigelloides NOT DETECTED NOT DETECTED Final   Salmonella species NOT DETECTED NOT DETECTED Final   Yersinia enterocolitica NOT DETECTED NOT DETECTED Final   Vibrio species NOT DETECTED NOT DETECTED Final   Vibrio cholerae NOT DETECTED NOT DETECTED Final   Enteroaggregative E coli (EAEC) NOT DETECTED NOT DETECTED Final   Enteropathogenic E coli (EPEC) NOT DETECTED NOT DETECTED Final   Enterotoxigenic E coli (ETEC) NOT DETECTED NOT DETECTED Final   Shiga like toxin producing E coli (STEC) NOT DETECTED NOT DETECTED  Final   Shigella/Enteroinvasive E coli (EIEC) NOT DETECTED NOT DETECTED Final   Cryptosporidium NOT DETECTED NOT DETECTED Final   Cyclospora cayetanensis NOT DETECTED NOT DETECTED Final   Entamoeba histolytica NOT DETECTED NOT DETECTED Final   Giardia lamblia NOT DETECTED NOT DETECTED Final   Adenovirus F40/41 NOT DETECTED NOT DETECTED Final   Astrovirus NOT DETECTED NOT DETECTED Final   Norovirus GI/GII NOT DETECTED NOT DETECTED Final   Rotavirus A NOT DETECTED NOT DETECTED Final   Sapovirus (I, II, IV, and V) NOT DETECTED NOT DETECTED Final    Comment: Performed at [facility], [address], [place], [place] [address]  C Difficile Quick Screen w PCR reflex     Status: None   Collection Time: 01/09/23  9:28 PM  Result Value Ref Range Status   C Diff antigen NEGATIVE NEGATIVE Final   C Diff toxin NEGATIVE NEGATIVE Final   C Diff interpretation No C. difficile detected.  Final    Comment: Performed at [facility], [address], [place], [place] [address]     Radiology Studies: No results found.  Scheduled Meds:  diltiazem  180 mg Oral Daily   flecainide  100 mg Oral  QHS   flecainide  50 mg Oral Daily   insulin pump   Subcutaneous TID WC, HS, 0200   pantoprazole  40 mg Oral Daily   rivaroxaban  20 mg Oral Q supper   simvastatin  20 mg Oral QHS   sodium chloride flush  3 mL Intravenous Q12H   Continuous Infusions:   LOS: 0 days    Time spent:  Debarah Crape, DO [facility]  To contact the attending physician between 7A-7P please use Epic Chat. To contact the covering physician during after hours 7P-7A, please review Amion.   01/11/2023, 2:53 PM   *This document has been created with the assistance of dictation software. Please excuse typographical errors. *

## 2023-01-11 NOTE — Consult Note (Cosign Needed)
Consultation  Referring Provider: Dr. Rennis Chris     Primary Care Physician:  Raquel James, MD Primary Gastroenterologist: Gentry Fitz here ([facility])       Reason for Consultation: Diarrhea             HPI:   Donna Wiley is a 82 y.o. female with a past medical history as listed below including diabetes on insulin pump, A-fib on anticoagulation, aortic stenosis, GERD, duodenitis, IDA and chronic mesenteric stranding and nodules, who transferred from med [facility] ED for 3 weeks of diarrhea associated with AKI.    At time of presentation patient reports she was traveling to the [employer] 3 weeks ago for the Thanksgiving holiday and developed acute onset of watery diarrhea 6-10 episodes per day, nonbloody which continued until now.  Rare scant blood on the toilet paper associated abdominal cramping when she needs to move her bowels and otherwise painless.    At time of interview patient is very aware of her recent history.  Tells me that the Saturday before Thanksgiving, 12/24/2022 she was first seen in the ER due to abdominal pain.  Tells me she would eat and feel very bloated and gassy and have generalized abdominal pain which seem to radiate through to her back.  Apparently they did not do much when she was in the ED then.      Then returned to the ED after developing diarrhea around Thanksgiving.  Tells me she would eat and have at least 8 loose watery urgent stools anytime she would eat throughout the day and through the night which would wake her up.  This has continued for the past 12 days, during that time she has had 2 CTs as noted below.  Apparently tried over-the-counter Imodium at home one time, but it did not seem to help.  She has taken 1 Imodium this morning for the first time this hospitalization after stool studies all returned negative and she actually has not had any bowel movements since lunchtime.  This is the first time that she has eaten and not had liquid stools. No  longer experiencing abdominal pain, only cramping just before a bowel movement which is relieved afterwards.     Does describe that along with all this diarrhea she is seeing occasional bright red blood on the toilet paper when wiping and feels like her rectum hurts.  She has been using a barrier cream which seems to be helping slightly.  Associated symptoms include a weight loss of 10 pounds over the past month.      Does describe a prior cholecystectomy 15 years ago.  She is on Xarelto which she typically takes at home with dinner. No new medications or change in diet.    Denies fever, chills, nausea or vomiting.   ER course: Lactate 0.8, potassium 3.1, bicarb 18, anion gap 10, WBC 4, hemoglobin 11, UA with hyaline casts and ketones; CT 01/04/2023 with new prominent lymph nodes in the small bowel mesentery, with diffuse fat stranding throughout the mesentery, small bowel is diffusely filled although nondistended, consistent with nonspecific enteritis, however superior mesenteric venous and portal venous thrombosis are general differential considerations, CT 01/06/2023 of the abdomen pelvis with contrast shows moderate central mesenteric stranding with some small nodes, progressive motor examinations  GI history: 07/14/2022 office visit with [facility] gastro for epigastric pain follow-up-at that time following up for annual refill of Pantoprazole, noted to be status post cholecystectomy 03/03/2021 EGD revealed  nodular mucosa of the GEJ with biopsy showing reflux esophagitis, negative gastric biopsies, duodenitis but no signs of celiac disease, small hiatal hernia 03/03/2021 colonoscopy with a few mild diverticula and small internal hemorrhoids  Past Medical History:  Diagnosis Date   Arthritis    Asthma    childhood   Atrial fibrillation (HCC)    Cystocele    Diabetes mellitus    GERD (gastroesophageal reflux disease)    High cholesterol    Kidney stone    Rectocele    SVT  (supraventricular tachycardia) (HCC)     Past Surgical History:  Procedure Laterality Date   ABDOMINAL HYSTERECTOMY     APPENDECTOMY     CATARACT EXTRACTION     CHOLECYSTECTOMY     FOOT SURGERY     HAND SURGERY     ROTATOR CUFF REPAIR      Family History  Problem Relation Age of Onset   Allergic rhinitis Daughter    Asthma Daughter    Lupus Daughter    Scoliosis Daughter    Breast cancer Neg Hx    Urticaria Neg Hx    Immunodeficiency Neg Hx    Angioedema Neg Hx    Atopy Neg Hx    Eczema Neg Hx     Social History   Tobacco Use   Smoking status: Never    Passive exposure: Never   Smokeless tobacco: Never  Vaping Use   Vaping status: Never Used  Substance Use Topics   Alcohol use: No   Drug use: No    Prior to Admission medications   Medication Sig Start Date End Date Taking? Authorizing Provider  b complex vitamins capsule Take 1 capsule by mouth daily.   Yes [provider]  calcium citrate (CALCITRATE - DOSED IN MG ELEMENTAL CALCIUM) 950 (200 Ca) MG tablet Take 500 mg of elemental calcium by mouth 2 (two) times daily.   Yes [provider]  cetirizine (ZYRTEC) 10 MG tablet Take 1 tablet by mouth every evening.   Yes [provider]  clobetasol cream (TEMOVATE) 0.05 % Apply 1 Application topically 2 (two) times daily as needed (Dermatitis). 09/15/21  Yes [provider]  diltiazem (CARDIZEM CD) 180 MG 24 hr capsule Take 180 mg by mouth at bedtime.   Yes [provider]  EPINEPHrine 0.3 mg/0.3 mL IJ SOAJ injection Inject 0.3 mg into the muscle as needed for anaphylaxis. 06/21/21  Yes [provider]  flecainide (TAMBOCOR) 50 MG tablet Take 50-100 mg by mouth See admin instructions. Take 50mg  (1 tablet) by mouth every morning and 100mg  (2 tablets) every evening.   Yes [provider]  insulin lispro (HUMALOG) 100 UNIT/ML injection Inject 0-15 Units into the skin 4 (four) times daily.   Yes [provider]  LORazepam (ATIVAN) 0.5 MG tablet Take 0.5 mg by mouth at bedtime. 12/10/22  Yes [provider]  mirabegron ER (MYRBETRIQ) 50 MG TB24 tablet Take 50 mg by mouth daily. 07/13/21 01/10/23 Yes [provider]  olmesartan-hydrochlorothiazide (BENICAR HCT) 40-12.5 MG tablet Take 1 tablet by mouth daily. 02/17/21  Yes [provider]  pantoprazole (PROTONIX) 40 MG tablet Take 40 mg by mouth daily. 06/02/21  Yes [provider]  rivaroxaban (XARELTO) 20 MG TABS tablet Take 20 mg by mouth every evening.   Yes [provider]  simvastatin (ZOCOR) 20 MG tablet Take 10 mg by mouth at bedtime.   Yes [provider]  predniSONE (DELTASONE) 50 MG tablet Take  one tablet 13 hours prior to scheduled CT scan. Take the second tablet 7 hours prior to the CT exam, and the third tablet 1 hour prior to the CT exam. Patient not taking: Reported on 01/10/2023 01/05/23   [provider]    Current Facility-Administered Medications  Medication Dose Route Frequency Provider Last Rate Last Admin   acetaminophen (TYLENOL) tablet 1,000 mg  1,000 mg Oral Q6H PRN Dolly Rias, MD       diltiazem (CARDIZEM CD) 24 hr capsule 180 mg  180 mg Oral Daily Dolly Rias, MD   180 mg at 01/11/23 1610   flecainide (TAMBOCOR) tablet 100 mg  100 mg Oral QHS Dolly Rias, MD   100 mg at 01/10/23 2032   flecainide (TAMBOCOR) tablet 50 mg  50 mg Oral Daily Dolly Rias, MD   50 mg at 01/11/23 9604   insulin pump   Subcutaneous TID WC, HS, 0200 Dolly Rias, MD   Given by Other at 01/10/23 1742   loperamide (IMODIUM) capsule 2 mg  2 mg Oral PRN Debarah Crape, DO   2 mg at 01/11/23 1200   pantoprazole (PROTONIX) EC tablet 40 mg  40 mg Oral Daily Dolly Rias, MD   40 mg at 01/11/23 5409   rivaroxaban (XARELTO) tablet 20 mg  20 mg Oral Q supper Dolly Rias, MD   20 mg at 01/10/23 1741   simvastatin (ZOCOR) tablet 20 mg  20 mg Oral QHS Dolly Rias, MD   20 mg at 01/10/23 2033   sodium chloride flush (NS) 0.9 % injection 3 mL  3 mL Intravenous Q12H Dolly Rias, MD   3 mL at 01/10/23 2034    Allergies as of 01/09/2023 - Review Complete 01/09/2023  Allergen Reaction Noted   Iodine Anaphylaxis 07/05/2021   Nickel Rash 12/30/2013   Charentais melon (french melon)  09/16/2015   Cherry Swelling 09/16/2015   Codeine  11/08/2010   Ivp dye [iodinated contrast media]  11/08/2010   Other  11/08/2010   Shellfish allergy  08/11/2014   Sulfa antibiotics  11/08/2010   Tape Dermatitis 07/02/2013     Review of Systems:    Constitutional: No fever or chills Skin: No rash Cardiovascular: No chest pain Respiratory: No SOB Gastrointestinal: See HPI and otherwise negative Genitourinary: No dysuria Neurological: No headache, dizziness or syncope Musculoskeletal: No new muscle or joint pain Hematologic: No bruising Psychiatric: No history of depression or anxiety    Physical Exam:  Vital signs in last 24 hours: Temp:  [98 F (36.7 C)-98.8 F (37.1 C)] 98.4 F (36.9 C) (12/11 1425) Pulse Rate:  [69-92] 78 (12/11 1425) Resp:  [16-19] 16 (12/11 1425) BP: (117-141)/(58-67) 124/64 (12/11 1425) SpO2:  [100 %] 100 % (12/11 1425) Weight:  [60.6 kg] 60.6 kg (12/11 0459) Last BM Date : 01/11/23 General:   Pleasant Elderly Caucasian female appears to be in NAD, Well developed, Well nourished, alert and cooperative Head:  Normocephalic and atraumatic. Eyes:   PEERL, EOMI. No icterus. Conjunctiva pink. Ears:  Normal auditory acuity. Neck:  Supple Throat: Oral cavity and pharynx without inflammation, swelling or lesion. Teeth in good condition. Lungs: Respirations even and unlabored. Lungs clear to auscultation bilaterally.   No wheezes, crackles, or rhonchi.  Heart: Normal S1, S2. No MRG. Regular rate and rhythm. No peripheral edema, cyanosis or pallor.  Abdomen:  Soft, nondistended, mild generalized ttp, No rebound or guarding.  Normal bowel sounds. No appreciable masses or hepatomegaly. Rectal:  External: dermatitis marked by  erythema and tenderness around rectum, external hemorrhoid, non-thrombosed; internal: no mass, some tenderness, no visibile fissure Msk:  Symmetrical without gross deformities. Peripheral pulses intact.  Extremities:  Without edema, no deformity or joint abnormality. Normal ROM, normal sensation. Neurologic:  Alert and  oriented x4;  grossly normal neurologically. CN II-XII intact.  Skin:   Dry and intact without significant lesions or rashes. Psychiatric: Demonstrates good judgement and reason without abnormal affect or behaviors.   LAB RESULTS: Recent Labs    01/09/23 1753 01/10/23 0557 01/11/23 0547  WBC 4.6 3.0* 3.9*  HGB 11.6* 9.6* 9.6*  HCT 34.4* 28.3* 29.3*  PLT 367 256 241   BMET Recent Labs    01/09/23 1753 01/10/23 0557 01/11/23 0547  NA 135 140 139  K 3.1* 3.4* 3.7  CL 107 113* 120*  CO2 18* 21* 17*  GLUCOSE 166* 117* 152*  BUN 41* 29* 18  CREATININE 1.29* 0.99 0.90  CALCIUM 8.8* 8.3* 7.7*   LFT Recent Labs    01/11/23 0547  PROT 4.3*  ALBUMIN 2.3*  AST 22  ALT 27  ALKPHOS 35*  BILITOT 0.4     Impression / Plan:   Impression: 1.  Diarrhea: Acute onset of diarrhea around Thanksgiving, 12/22/2022, CT after onset of symptoms on 12/6 with moderate mesenteric stranding and nodules which are progressive and chronic noted since at least 2017, C. difficile apparently negative and GI path panel negative, s/p cholecystectomy, Imodium x1 this morning and no further BM since eating lunch; consider viral gastroenteritis with possible post viral IBS vs microscopic colitis vs other 2.  Possible AKI versus CKD: Creatinine elevated 1.29 on admission 3.  Abnormal CT of the abdomen: Progressive mesenteric stranding and nodules 4.  Hypokalemia: Repleted 5.  A-fib on Xarelto 6.  External hemorrhoid and rectal dermatitis: This is giving the patient some irritation and  discomfort  Plan: 1.  Will asked the hospitalist to hold Xarelto so that just in case her diarrhea continues we can proceed with colonoscopy after washout on Friday. 2.  For now continue Imodium, would go ahead and schedule this out. 3.  Lymph node findings on recent CTs are very nonspecific, no need for emergent colonoscopy just due to this imaging. 4.  Hopefully patient continues to improve on Imodium and can follow with her regular GI doctor.  If not we are happy to help her with a colonoscopy here. 5.  Prescribed Hydrocortisone ointment to be applied to her hemorrhoid twice daily for the next 7 to 14 days.  Continue barrier ointment as well for rectal discomfort.  Thank you for your kind consultation, we will continue to follow.  Violet Baldy [facility]  01/11/2023, 3:33 PM  I have taken an interval history, thoroughly reviewed the chart and examined the patient. I agree with the Advanced Practitioner's note, impression and recommendations, and have recorded additional findings, impressions and recommendations below. I performed a substantive portion of this encounter (>50% time spent), including a complete performance of the medical decision making.  My additional thoughts are as follows:  Possible postinfectious diarrhea, possible microscopic colitis.  Acuity of onset makes chronic IBD such as Crohn's/UC unlikely.  Also atypical for that to start at this age.     [name] Office:[phone]

## 2023-01-12 DIAGNOSIS — I060 Rheumatic aortic stenosis: Secondary | ICD-10-CM | POA: Diagnosis present

## 2023-01-12 DIAGNOSIS — K219 Gastro-esophageal reflux disease without esophagitis: Secondary | ICD-10-CM | POA: Diagnosis present

## 2023-01-12 DIAGNOSIS — N179 Acute kidney failure, unspecified: Secondary | ICD-10-CM | POA: Diagnosis present

## 2023-01-12 DIAGNOSIS — Z91041 Radiographic dye allergy status: Secondary | ICD-10-CM | POA: Diagnosis not present

## 2023-01-12 DIAGNOSIS — D539 Nutritional anemia, unspecified: Secondary | ICD-10-CM | POA: Diagnosis present

## 2023-01-12 DIAGNOSIS — E869 Volume depletion, unspecified: Secondary | ICD-10-CM | POA: Diagnosis present

## 2023-01-12 DIAGNOSIS — I4891 Unspecified atrial fibrillation: Secondary | ICD-10-CM | POA: Diagnosis not present

## 2023-01-12 DIAGNOSIS — Z7984 Long term (current) use of oral hypoglycemic drugs: Secondary | ICD-10-CM | POA: Diagnosis not present

## 2023-01-12 DIAGNOSIS — Z86718 Personal history of other venous thrombosis and embolism: Secondary | ICD-10-CM | POA: Diagnosis not present

## 2023-01-12 DIAGNOSIS — I10 Essential (primary) hypertension: Secondary | ICD-10-CM | POA: Diagnosis present

## 2023-01-12 DIAGNOSIS — Z9641 Presence of insulin pump (external) (internal): Secondary | ICD-10-CM | POA: Diagnosis present

## 2023-01-12 DIAGNOSIS — R591 Generalized enlarged lymph nodes: Secondary | ICD-10-CM | POA: Diagnosis not present

## 2023-01-12 DIAGNOSIS — E876 Hypokalemia: Secondary | ICD-10-CM | POA: Diagnosis present

## 2023-01-12 DIAGNOSIS — E872 Acidosis, unspecified: Secondary | ICD-10-CM | POA: Diagnosis present

## 2023-01-12 DIAGNOSIS — A09 Infectious gastroenteritis and colitis, unspecified: Secondary | ICD-10-CM | POA: Diagnosis not present

## 2023-01-12 DIAGNOSIS — E7800 Pure hypercholesterolemia, unspecified: Secondary | ICD-10-CM | POA: Diagnosis present

## 2023-01-12 DIAGNOSIS — K52839 Microscopic colitis, unspecified: Secondary | ICD-10-CM | POA: Diagnosis not present

## 2023-01-12 DIAGNOSIS — K529 Noninfective gastroenteritis and colitis, unspecified: Secondary | ICD-10-CM | POA: Diagnosis present

## 2023-01-12 DIAGNOSIS — R933 Abnormal findings on diagnostic imaging of other parts of digestive tract: Secondary | ICD-10-CM | POA: Diagnosis not present

## 2023-01-12 DIAGNOSIS — E860 Dehydration: Secondary | ICD-10-CM | POA: Diagnosis present

## 2023-01-12 DIAGNOSIS — D509 Iron deficiency anemia, unspecified: Secondary | ICD-10-CM | POA: Diagnosis present

## 2023-01-12 DIAGNOSIS — Z7901 Long term (current) use of anticoagulants: Secondary | ICD-10-CM | POA: Diagnosis not present

## 2023-01-12 DIAGNOSIS — K2980 Duodenitis without bleeding: Secondary | ICD-10-CM | POA: Diagnosis present

## 2023-01-12 DIAGNOSIS — Z794 Long term (current) use of insulin: Secondary | ICD-10-CM | POA: Diagnosis not present

## 2023-01-12 DIAGNOSIS — E10649 Type 1 diabetes mellitus with hypoglycemia without coma: Secondary | ICD-10-CM | POA: Diagnosis not present

## 2023-01-12 DIAGNOSIS — R197 Diarrhea, unspecified: Secondary | ICD-10-CM | POA: Diagnosis present

## 2023-01-12 DIAGNOSIS — Z79899 Other long term (current) drug therapy: Secondary | ICD-10-CM | POA: Diagnosis not present

## 2023-01-12 DIAGNOSIS — R932 Abnormal findings on diagnostic imaging of liver and biliary tract: Secondary | ICD-10-CM | POA: Diagnosis not present

## 2023-01-12 DIAGNOSIS — I2510 Atherosclerotic heart disease of native coronary artery without angina pectoris: Secondary | ICD-10-CM | POA: Diagnosis present

## 2023-01-12 DIAGNOSIS — I480 Paroxysmal atrial fibrillation: Secondary | ICD-10-CM | POA: Diagnosis present

## 2023-01-12 LAB — COMPREHENSIVE METABOLIC PANEL
ALT: 27 U/L (ref 0–44)
AST: 25 U/L (ref 15–41)
Albumin: 2.6 g/dL — ABNORMAL LOW (ref 3.5–5.0)
Alkaline Phosphatase: 39 U/L (ref 38–126)
Anion gap: 4 — ABNORMAL LOW (ref 5–15)
BUN: 12 mg/dL (ref 8–23)
CO2: 19 mmol/L — ABNORMAL LOW (ref 22–32)
Calcium: 8.2 mg/dL — ABNORMAL LOW (ref 8.9–10.3)
Chloride: 115 mmol/L — ABNORMAL HIGH (ref 98–111)
Creatinine, Ser: 0.77 mg/dL (ref 0.44–1.00)
GFR, Estimated: >60 mL/min (ref >60)
Glucose, Bld: 163 mg/dL — ABNORMAL HIGH (ref 70–99)
Potassium: 3.7 mmol/L (ref 3.5–5.1)
Sodium: 138 mmol/L (ref 135–145)
Total Bilirubin: 0.5 mg/dL (ref <1.2)
Total Protein: 4.8 g/dL — ABNORMAL LOW (ref 6.5–8.1)

## 2023-01-12 LAB — CBC WITH DIFFERENTIAL/PLATELET
Abs Immature Granulocytes: 0.05 10*3/uL (ref 0.00–0.07)
Basophils Absolute: 0.1 10*3/uL (ref 0.0–0.1)
Basophils Relative: 1 %
Eosinophils Absolute: 0.1 10*3/uL (ref 0.0–0.5)
Eosinophils Relative: 3 %
HCT: 31.7 % — ABNORMAL LOW (ref 36.0–46.0)
Hemoglobin: 10.5 g/dL — ABNORMAL LOW (ref 12.0–15.0)
Immature Granulocytes: 1 %
Lymphocytes Relative: 22 %
Lymphs Abs: 1 10*3/uL (ref 0.7–4.0)
MCH: 30.1 pg (ref 26.0–34.0)
MCHC: 33.1 g/dL (ref 30.0–36.0)
MCV: 90.8 fL (ref 80.0–100.0)
Monocytes Absolute: 0.7 10*3/uL (ref 0.1–1.0)
Monocytes Relative: 14 %
Neutro Abs: 2.8 10*3/uL (ref 1.7–7.7)
Neutrophils Relative %: 59 %
Platelets: 267 10*3/uL (ref 150–400)
RBC: 3.49 MIL/uL — ABNORMAL LOW (ref 3.87–5.11)
RDW: 14.4 % (ref 11.5–15.5)
WBC: 4.8 10*3/uL (ref 4.0–10.5)
nRBC: 0 % (ref 0.0–0.2)

## 2023-01-12 LAB — GLUCOSE, CAPILLARY
Glucose-Capillary: 168 mg/dL — ABNORMAL HIGH (ref 70–99)
Glucose-Capillary: 164 mg/dL — ABNORMAL HIGH (ref 70–99)
Glucose-Capillary: 116 mg/dL — ABNORMAL HIGH (ref 70–99)
Glucose-Capillary: 128 mg/dL — ABNORMAL HIGH (ref 70–99)
Glucose-Capillary: 211 mg/dL — ABNORMAL HIGH (ref 70–99)
Glucose-Capillary: 145 mg/dL — ABNORMAL HIGH (ref 70–99)
Glucose-Capillary: 206 mg/dL — ABNORMAL HIGH (ref 70–99)

## 2023-01-12 LAB — PHOSPHORUS: Phosphorus: 1.8 mg/dL — ABNORMAL LOW (ref 2.5–4.6)

## 2023-01-12 LAB — MAGNESIUM: Magnesium: 1.8 mg/dL (ref 1.7–2.4)

## 2023-01-12 MED ORDER — PEG-KCL-NACL-NASULF-NA ASC-C 100 G PO SOLR
0.5000 | Freq: Once | ORAL | Status: AC
  Administered 2023-01-12: 100 g via ORAL
  Filled 2023-01-12: qty 1

## 2023-01-12 MED ORDER — PEG-KCL-NACL-NASULF-NA ASC-C 100 G PO SOLR
0.5000 | Freq: Once | ORAL | Status: AC
  Administered 2023-01-12: 100 g via ORAL

## 2023-01-12 MED ORDER — PEG-KCL-NACL-NASULF-NA ASC-C 100 G PO SOLR: 1.0000 | Freq: Once | ORAL | Status: DC

## 2023-01-12 NOTE — Anesthesia Preprocedure Evaluation (Addendum)
Anesthesia Evaluation  Patient identified by MRN, date of birth, ID band Patient awake    Reviewed: Allergy & Precautions, NPO status , Patient's Chart, lab work & pertinent test results  Airway Mallampati: II  TM Distance: >3 FB Neck ROM: Full    Dental  (+) Chipped, Dental Advisory Given,    Pulmonary asthma    Pulmonary exam normal breath sounds clear to auscultation       Cardiovascular Normal cardiovascular exam+ dysrhythmias (eliquis) Atrial Fibrillation and Supra Ventricular Tachycardia  Rhythm:Regular Rate:Normal     Neuro/Psych negative neurological ROS  negative psych ROS   GI/Hepatic Neg liver ROS,GERD  ,,  Endo/Other  diabetes, Insulin Dependent    Renal/GU negative Renal ROS  negative genitourinary   Musculoskeletal negative musculoskeletal ROS (+)    Abdominal   Peds  Hematology negative hematology ROS (+)   Anesthesia Other Findings  82 y.o. female with paroxysmal atrial fibrillation on Xarelto, type 2 diabetes, GERD, hypertension, deficiency anemia, who presents to the emergency department for evaluation of diarrhea and abdominal pain  Reproductive/Obstetrics                             Anesthesia Physical Anesthesia Plan  ASA: 3  Anesthesia Plan: MAC   Post-op Pain Management:    Induction: Intravenous  PONV Risk Score and Plan: Propofol infusion and Treatment may vary due to age or medical condition  Airway Management Planned: Natural Airway  Additional Equipment:   Intra-op Plan:   Post-operative Plan:   Informed Consent: I have reviewed the patients History and Physical, chart, labs and discussed the procedure including the risks, benefits and alternatives for the proposed anesthesia with the patient or authorized representative who has indicated his/her understanding and acceptance.     Dental advisory given  Plan Discussed with: CRNA  Anesthesia Plan  Comments:        Anesthesia Quick Evaluation

## 2023-01-12 NOTE — Progress Notes (Addendum)
Progress Note   Subjective  Chief Complaint: Diarrhea  Unfortunately patient tells me this morning that she was up all night with loose stools regardless of her scheduled Imodium which seemed to be holding her yesterday afternoon.  She is obviously worried given ongoing symptoms.  Tells me she like to figure out what is actually going on.  Again only with abdominal cramping prior to a bowel movement which is relieved after and just a lot of "gurgling in between".   Objective   Vital signs in last 24 hours: Temp:  [97.6 F (36.4 C)-98.5 F (36.9 C)] 98.1 F (36.7 C) (12/12 0828) Pulse Rate:  [72-78] 74 (12/12 0828) Resp:  [16-18] 17 (12/12 0828) BP: (124-149)/(61-68) 140/61 (12/12 0828) SpO2:  [97 %-100 %] 97 % (12/12 0828) Weight:  [60.1 kg] 60.1 kg (12/12 0458) Last BM Date : 01/11/23 General:    Elderly white female in NAD Heart:  Regular rate and rhythm; no murmurs Lungs: Respirations even and unlabored, lungs CTA bilaterally Abdomen:  Soft, nontender and nondistended.  Increased bowel sounds all 4 quadrants Psych:  Cooperative. Normal mood and affect.  Intake/Output from previous day: 12/11 0701 - 12/12 0700 In: 480 [P.O.:480] Out: -    Lab Results: Recent Labs    01/11/23 0547 01/11/23 1541 01/12/23 0532  WBC 3.9* 4.4 4.8  HGB 9.6* 10.9* 10.5*  HCT 29.3* 32.6* 31.7*  PLT 241 267 267   BMET Recent Labs    01/10/23 0557 01/11/23 0547 01/12/23 0532  NA 140 139 138  K 3.4* 3.7 3.7  CL 113* 120* 115*  CO2 21* 17* 19*  GLUCOSE 117* 152* 163*  BUN 29* 18 12  CREATININE 0.99 0.90 0.77  CALCIUM 8.3* 7.7* 8.2*   LFT Recent Labs    01/12/23 0532  PROT 4.8*  ALBUMIN 2.6*  AST 25  ALT 27  ALKPHOS 39  BILITOT 0.5    Assessment / Plan:   Assessment: 1.  Diarrhea: Acute onset of diarrhea around Thanksgiving, 12/22/2022, CT with mesenteric stranding and nodules progressive on CT 12/7, C. difficile and GI path panel negative, status post cholecystectomy  years ago, Imodium initially helpful in the hospital, but now further watery stools overnight; consider microscopic colitis versus IBS versus other 2.  AKI: Resolved 3.  Abnormal CT of the abdomen: With progressive mesenteric stranding and nodules 4.  Hypokalemia: Corrected 5.  A-fib on Xarelto: Xarelto currently on hold with plans for colonoscopy tomorrow 6.  External hemorrhoid/rectal dermatitis: Some better with addition of Hydrocortisone overnight  Plan: 1.  Will go ahead and schedule patient for a colonoscopy tomorrow with Dr. Myrtie Neither given ongoing diarrhea regardless of Imodium.  Continue to hold Xarelto for now.  Did discuss risks, benefits, limitations and alternatives and patient agrees to proceed. 2.  Will be on clear liquid diet today and n.p.o. at midnight 3.  Will go ahead and start movi prep today in split dose fashion 4.  Will hold Imodium while giving bowel prep 5.  Continue to monitor electrolytes 6.  Continue Hydrocortisone and barrier ointment  Thank you for your kind consultation, we will continue to follow.    LOS: 0 days   Unk Lightning  01/12/2023, 10:53 AM   I have taken an interval history, thoroughly reviewed the chart and examined the patient. I agree with the Advanced Practitioner's note, impression and recommendations, and have recorded additional findings, impressions and recommendations below. I performed a substantive portion of this encounter (>50% time  spent), including a complete performance of the medical decision making.  My additional thoughts are as follows:  Diarrhea continues, though some relief from Imodium.  Further watery stool overnight, raising suspicion for microscopic colitis.  Infectious workup thus far negative, but this still may have been a postinfectious IBS-like picture.  Elevated fecal calprotectin suggest possible inflammatory condition, though unusual age and acute onset for Crohn's/UC.  Colonoscopy tomorrow with biopsies  even if mucosa normal.  Probable discharge home afterwards to resume oral anticoagulation and follow-up with outpatient GI physician (Dr.Shearin) while on Lomotil.   [name] Office:[phone]

## 2023-01-12 NOTE — Plan of Care (Signed)
  Problem: Education: Goal: Knowledge of General Education information will improve Description: Including pain rating scale, medication(s)/side effects and non-pharmacologic comfort measures Outcome: Progressing   Problem: Pain Management: Goal: General experience of comfort will improve Outcome: Progressing   Problem: Safety: Goal: Ability to remain free from injury will improve Outcome: Progressing

## 2023-01-12 NOTE — Progress Notes (Signed)
PROGRESS NOTE    Donna Wiley  ZOX:[id number] DOB: 08-28-1940 DOA: 01/09/2023 PCP: Raquel James, MD  Chief Complaint  Patient presents with   Diarrhea    Hospital Course:  Donna Wiley is 82 y.o. female with paroxysmal atrial fibrillation on Xarelto, type 2 diabetes, GERD, hypertension, deficiency anemia, aortic stenosis, who presents to the emergency department for evaluation of diarrhea and abdominal pain.  Patient has had diarrhea for the last 12 days.  She reports that is nonbloody but watery.  No nausea no vomiting.  She has had anorexia due to fear of going to the bathroom.  She denies any recent antibiotics or history of C. difficile.  Denies any sick contacts.  Patient initially presented to outside hospital 2 weeks ago after 3 days of acute watery diarrhea.  She reports workup was negative at that time.  Thus far her GI PCR and C. difficile have been negative.  Subjective: She had brief relief of symptoms yesterday with Imodium but unfortunately diarrhea has recurred overnight.  Objective: Vitals:   01/12/23 0458 01/12/23 0826 01/12/23 0828 01/12/23 1534  BP: (!) 149/68 (!) 140/61 (!) 140/61 134/69  Pulse: 72  74 80  Resp: 18  17 18   Temp: 97.6 F (36.4 C)  98.1 F (36.7 C) 98.1 F (36.7 C)  TempSrc: Oral   Oral  SpO2: 98%  97% 100%  Weight: 60.1 kg     Height:       No intake or output data in the 24 hours ending 01/12/23 1718  Filed Weights   01/10/23 0500 01/11/23 0459 01/12/23 0458  Weight: 59.8 kg 60.6 kg 60.1 kg    Examination: General exam: Appears calm and comfortable, NAD  Respiratory system: No work of breathing, symmetric chest wall expansion Cardiovascular system: S1 & S2 heard, RRR.  Gastrointestinal system: Abdomen is nondistended, soft and not tender to palpation.  No guarding Neuro: Alert and oriented. No focal neurological deficits. Extremities: Symmetric, expected ROM Skin: No rashes, lesions Psychiatry: Demonstrates appropriate  judgement and insight. Mood & affect appropriate for situation.   Assessment & Plan:  Principal Problem:   Diarrhea Active Problems:   Gastroenteritis   Mesenteric lymphadenopathy   Stage 1 acute kidney injury (HCC)  Diarrhea - Initially thought to be bacterial gastroenteritis in the setting of travel and acute onset - CT abdomen pelvis 12/6: Moderate mesenteric stranding and nodules progressively chronic since at least 2017.  No other findings of colitis. - GI PCR negative - C. difficile negative - TSH, HIV, lipase all negative - O&P pending - Status post fluid resuscitation - Fecal calprotectin elevated though this would be unusual age for new IBD - Patient reports last colonoscopy was 2023 which was normal - Gastroenterology consulted, planning for colonoscopy tomorrow.  Bowel prep today.  Eliquis on hold.  Will follow biopsies  Chronic mesenteric stranding and nodules - Seen on outside CT scan in 2017, redemonstrated on outside CT 12/6: Moderate central mesenteric stranding with some small nodules.  Progressing from older exams.  No splenomegaly or pathological nodal enlargement.  Recommending follow-up CT in 3 to 6 months.  Discussed this with patient directly. - Follow-up CT outpt  AKI - Appears baseline creatinine is 0.9, was 1.29 on admission - Suspect this is prerenal and in setting of diarrheal illness. - Has resolved now.  Will continue to follow with daily CMP  Leukopenia - WBC 4.6 on arrival -> 3.0 -> 3.9 - Suspect secondary to infection as above, she remains  afebrile - HIV negative - decreased RBCs - Peripheral smear reveals atypical lymphocytes, teardrop cells, burr cells, giant platelets: May be secondary to acute viral infection.  Cannot rule out myeloproliferative disorder or myelodysplastic disorder.  Will discuss with hematology.  Generalized weakness - Due to diarrhea and electrolyte deficiencies as above - PT/OT  Hypokalemia - Replace as needed - Due  to diarrhea  Type 1 diabetes, long-term use of insulin - On insulin pump, will continue with patient management of insulin pump - Continue CBG  Atrial fibrillation on anticoagulation - Continue home dose flecainide, diltiazem, Xarelto  Aortic stenosis - Continue outpatient monitoring  Hypertension - Continue home meds for now - Hold ARB/HCTZ due to AKI and volume depletion  Hyperlipidemia - Continue statin  GERD Duodenitis - Continue PPI  History of IJ thrombus Cardiovascular disease - Home meds as above  Iron deficiency anemia - Hgb stable for now - Ferrous sulfate       DVT prophylaxis: SCDs for now   Code Status: Full Code Family Communication: None at bedside. Discussed directly with pt Disposition:  Status is: inpt for intractable diarrhea and ongoing work up    Consultants:  GI  Treatment Team:  Consulting Physician: Sherrilyn Rist, MD  Procedures:  N/a  Antimicrobials:  Anti-infectives (From admission, onward)    None       Data Reviewed: I have personally reviewed following labs and imaging studies CBC: Recent Labs  Lab 01/09/23 1753 01/10/23 0557 01/11/23 0547 01/11/23 1541 01/12/23 0532  WBC 4.6 3.0* 3.9* 4.4 4.8  NEUTROABS  --   --   --  2.9 2.8  HGB 11.6* 9.6* 9.6* 10.9* 10.5*  HCT 34.4* 28.3* 29.3* 32.6* 31.7*  MCV 88.4 89.0 90.7 90.3 90.8  PLT 367 256 241 267 267   Basic Metabolic Panel: Recent Labs  Lab 01/09/23 1753 01/09/23 2128 01/10/23 0557 01/11/23 0547 01/12/23 0532  NA 135  --  140 139 138  K 3.1*  --  3.4* 3.7 3.7  CL 107  --  113* 120* 115*  CO2 18*  --  21* 17* 19*  GLUCOSE 166*  --  117* 152* 163*  BUN 41*  --  29* 18 12  CREATININE 1.29*  --  0.99 0.90 0.77  CALCIUM 8.8*  --  8.3* 7.7* 8.2*  MG  --  1.9 1.9 1.8 1.8  PHOS  --   --  1.7* 2.0* 1.8*   GFR: Estimated Creatinine Clearance: 42.1 mL/min (by C-G formula based on SCr of 0.77 mg/dL). Liver Function Tests: Recent Labs  Lab 01/09/23 1753  01/11/23 0547 01/12/23 0532  AST 35 22 25  ALT 41 27 27  ALKPHOS 44 35* 39  BILITOT 0.8 0.4 0.5  PROT 5.9* 4.3* 4.8*  ALBUMIN 3.3* 2.3* 2.6*   CBG: Recent Labs  Lab 01/12/23 0153 01/12/23 0449 01/12/23 0827 01/12/23 1140 01/12/23 1617  GLUCAP 168* 164* 116* 128* 211*    Recent Results (from the past 240 hours)  Gastrointestinal Panel by PCR , Stool     Status: None   Collection Time: 01/09/23  9:28 PM   Specimen: Urine, Clean Catch; Stool  Result Value Ref Range Status   Campylobacter species NOT DETECTED NOT DETECTED Final   Plesimonas shigelloides NOT DETECTED NOT DETECTED Final   Salmonella species NOT DETECTED NOT DETECTED Final   Yersinia enterocolitica NOT DETECTED NOT DETECTED Final   Vibrio species NOT DETECTED NOT DETECTED Final   Vibrio cholerae NOT DETECTED  NOT DETECTED Final   Enteroaggregative E coli (EAEC) NOT DETECTED NOT DETECTED Final   Enteropathogenic E coli (EPEC) NOT DETECTED NOT DETECTED Final   Enterotoxigenic E coli (ETEC) NOT DETECTED NOT DETECTED Final   Shiga like toxin producing E coli (STEC) NOT DETECTED NOT DETECTED Final   Shigella/Enteroinvasive E coli (EIEC) NOT DETECTED NOT DETECTED Final   Cryptosporidium NOT DETECTED NOT DETECTED Final   Cyclospora cayetanensis NOT DETECTED NOT DETECTED Final   Entamoeba histolytica NOT DETECTED NOT DETECTED Final   Giardia lamblia NOT DETECTED NOT DETECTED Final   Adenovirus F40/41 NOT DETECTED NOT DETECTED Final   Astrovirus NOT DETECTED NOT DETECTED Final   Norovirus GI/GII NOT DETECTED NOT DETECTED Final   Rotavirus A NOT DETECTED NOT DETECTED Final   Sapovirus (I, II, IV, and V) NOT DETECTED NOT DETECTED Final    Comment: Performed at [facility], [address] [place], [place] [address]  C Difficile Quick Screen w PCR reflex     Status: None   Collection Time: 01/09/23  9:28 PM  Result Value Ref Range Status   C Diff antigen NEGATIVE NEGATIVE Final   C Diff toxin NEGATIVE  NEGATIVE Final   C Diff interpretation No C. difficile detected.  Final    Comment: Performed at [facility], [address] [place], [place] [address]  Calprotectin, Fecal     Status: Abnormal   Collection Time: 01/10/23  2:04 AM   Specimen: Stool  Result Value Ref Range Status   Calprotectin, Fecal 297 (H) 0 - 120 ug/g Final    Comment: (NOTE) Concentration     Interpretation   Follow-Up < 5 - 50 ug/g     Normal           None >50 -120 ug/g     Borderline       Re-evaluate in 4-6 weeks    >120 ug/g     Abnormal         Repeat as clinically                                   indicated Performed At: [facility] [address] [place], [place] [id number] Jolene Schimke MD HQ:[id number]      Radiology Studies: No results found.  Scheduled Meds:  diltiazem  180 mg Oral Daily   flecainide  100 mg Oral QHS   flecainide  50 mg Oral Daily   hydrocortisone   Rectal BID   insulin pump   Subcutaneous TID WC, HS, 0200   loperamide  2 mg Oral Q6H WA   pantoprazole  40 mg Oral Daily   peg 3350 powder  0.5 kit Oral Once   simvastatin  20 mg Oral QHS   sodium chloride flush  3 mL Intravenous Q12H   Continuous Infusions:   LOS: 0 days    Time spent:  Debarah Crape, DO [facility]  To contact the attending physician between 7A-7P please use Epic Chat. To contact the covering physician during after hours 7P-7A, please review Amion.   01/12/2023, 5:18 PM   *This document has been created with the assistance of dictation software. Please excuse typographical errors. *

## 2023-01-12 NOTE — H&P (View-Only) (Signed)
 Progress Note   Subjective  Chief Complaint: Diarrhea  Unfortunately patient tells me this morning that she was up all night with loose stools regardless of her scheduled Imodium which seemed to be holding her yesterday afternoon.  She is obviously worried given ongoing symptoms.  Tells me she like to figure out what is actually going on.  Again only with abdominal cramping prior to a bowel movement which is relieved after and just a lot of "gurgling in between".   Objective   Vital signs in last 24 hours: Temp:  [97.6 F (36.4 C)-98.5 F (36.9 C)] 98.1 F (36.7 C) (12/12 0828) Pulse Rate:  [72-78] 74 (12/12 0828) Resp:  [16-18] 17 (12/12 0828) BP: (124-149)/(61-68) 140/61 (12/12 0828) SpO2:  [97 %-100 %] 97 % (12/12 0828) Weight:  [60.1 kg] 60.1 kg (12/12 0458) Last BM Date : 01/11/23 General:    Elderly white female in NAD Heart:  Regular rate and rhythm; no murmurs Lungs: Respirations even and unlabored, lungs CTA bilaterally Abdomen:  Soft, nontender and nondistended.  Increased bowel sounds all 4 quadrants Psych:  Cooperative. Normal mood and affect.  Intake/Output from previous day: 12/11 0701 - 12/12 0700 In: 480 [P.O.:480] Out: -    Lab Results: Recent Labs    01/11/23 0547 01/11/23 1541 01/12/23 0532  WBC 3.9* 4.4 4.8  HGB 9.6* 10.9* 10.5*  HCT 29.3* 32.6* 31.7*  PLT 241 267 267   BMET Recent Labs    01/10/23 0557 01/11/23 0547 01/12/23 0532  NA 140 139 138  K 3.4* 3.7 3.7  CL 113* 120* 115*  CO2 21* 17* 19*  GLUCOSE 117* 152* 163*  BUN 29* 18 12  CREATININE 0.99 0.90 0.77  CALCIUM 8.3* 7.7* 8.2*   LFT Recent Labs    01/12/23 0532  PROT 4.8*  ALBUMIN 2.6*  AST 25  ALT 27  ALKPHOS 39  BILITOT 0.5    Assessment / Plan:   Assessment: 1.  Diarrhea: Acute onset of diarrhea around Thanksgiving, 12/22/2022, CT with mesenteric stranding and nodules progressive on CT 12/7, C. difficile and GI path panel negative, status post cholecystectomy  years ago, Imodium initially helpful in the hospital, but now further watery stools overnight; consider microscopic colitis versus IBS versus other 2.  AKI: Resolved 3.  Abnormal CT of the abdomen: With progressive mesenteric stranding and nodules 4.  Hypokalemia: Corrected 5.  A-fib on Xarelto: Xarelto currently on hold with plans for colonoscopy tomorrow 6.  External hemorrhoid/rectal dermatitis: Some better with addition of Hydrocortisone overnight  Plan: 1.  Will go ahead and schedule patient for a colonoscopy tomorrow with Dr. Myrtie Neither given ongoing diarrhea regardless of Imodium.  Continue to hold Xarelto for now.  Did discuss risks, benefits, limitations and alternatives and patient agrees to proceed. 2.  Will be on clear liquid diet today and n.p.o. at midnight 3.  Will go ahead and start movi prep today in split dose fashion 4.  Will hold Imodium while giving bowel prep 5.  Continue to monitor electrolytes 6.  Continue Hydrocortisone and barrier ointment  Thank you for your kind consultation, we will continue to follow.    LOS: 0 days   Unk Lightning  01/12/2023, 10:53 AM   I have taken an interval history, thoroughly reviewed the chart and examined the patient. I agree with the Advanced Practitioner's note, impression and recommendations, and have recorded additional findings, impressions and recommendations below. I performed a substantive portion of this encounter (>50% time  spent), including a complete performance of the medical decision making.  My additional thoughts are as follows:  Diarrhea continues, though some relief from Imodium.  Further watery stool overnight, raising suspicion for microscopic colitis.  Infectious workup thus far negative, but this still may have been a postinfectious IBS-like picture.  Elevated fecal calprotectin suggest possible inflammatory condition, though unusual age and acute onset for Crohn's/UC.  Colonoscopy tomorrow with biopsies  even if mucosa normal.  Probable discharge home afterwards to resume oral anticoagulation and follow-up with outpatient GI physician (Dr.Shearin) while on Lomotil.   [name] Office:[phone]

## 2023-01-12 NOTE — Progress Notes (Signed)
Moviprep 100g kit given to pt

## 2023-01-12 NOTE — Progress Notes (Signed)
   01/12/23 1255  Mobility  Activity Ambulated independently in hallway  Level of Assistance Independent  Assistive Device None  Distance Ambulated (ft) 350 ft  Activity Response Tolerated fair  Mobility Referral Yes  Mobility visit 1 Mobility  Mobility Specialist Start Time (ACUTE ONLY) 1249  Mobility Specialist Stop Time (ACUTE ONLY) 1255  Mobility Specialist Time Calculation (min) (ACUTE ONLY) 6 min   Mobility Specialist: Progress Note  Pt agreeable to mobility session - received in bed. Required Ind using no AD. C/o stomach "gurgles" and BR urgency. Returned to bed with all needs met - call bell within reach.   Barnie Mort, BS Mobility Specialist Please contact via SecureChat or Rehab office at [phone].

## 2023-01-12 NOTE — Inpatient Diabetes Management (Signed)
Inpatient Diabetes Program Recommendations  AACE/ADA: New Consensus Statement on Inpatient Glycemic Control (2015)  Target Ranges:  Prepandial:   less than 140 mg/dL      Peak postprandial:   less than 180 mg/dL (1-2 hours)      Critically ill patients:  140 - 180 mg/dL   Lab Results  Component Value Date   GLUCAP 128 (H) 01/12/2023    Review of Glycemic Control  Diabetes history: type 1 Outpatient Diabetes medications: Omnipod insulin pump with Humalog Current orders for Inpatient glycemic control: Insulin pump order set  Inpatient Diabetes Program Recommendations:   Spoke with patient at the bedside. States that she has had some low blood sugars the last 2 days. Patient cannot decrease her insulin pumps rates. Since patient will be having a colonoscopy tomorrow and will be NPO starting at [place], recommend changing blood sugars checks to every 4 hours until after the test starting now since she has been having low blood sugars. Patient could suspend her insulin pump for 30 minutes if having lows; and could suspend her insulin pump during the colonoscopy.   Smith Mince RN BSN CDE Diabetes Coordinator Pager: [phone]  8am-5pm

## 2023-01-12 NOTE — Progress Notes (Signed)
Hypoglycemic Event  CBG: 66  Treatment: 8 oz juice/soda and D50 25 mL (12.5 gm) 2 cups of ice cream, 3 glucose tablets, 2 pack of saltine crackers   Symptoms: None  Follow-up CBG: Time:2153 CBG Result:53 Follow up CBG: Time:2236     CBG Result: 55 Follow up CBG: Time: 2307    CBG Result: 58 Follow up CBG: Time: 2346     CBG Result: 142 Follow up CBG: Time: 0153     CBG Result: 168  Possible Reasons for Event: Other:   Diarrhea Episodes- loperamide 2mg  po given   Comments/MD notified: Anthoney Harada NP

## 2023-01-13 ENCOUNTER — Encounter (HOSPITAL_COMMUNITY)
Admission: EM | Disposition: A | Discharge status: Home / Self Care | Payer: Self-pay | Source: Home / Self Care | Attending: Family Medicine

## 2023-01-13 ENCOUNTER — Inpatient Hospital Stay (HOSPITAL_COMMUNITY): Payer: Medicare PPO | Admitting: Anesthesiology

## 2023-01-13 DIAGNOSIS — R197 Diarrhea, unspecified: Secondary | ICD-10-CM

## 2023-01-13 DIAGNOSIS — K52839 Microscopic colitis, unspecified: Secondary | ICD-10-CM | POA: Diagnosis not present

## 2023-01-13 HISTORY — PX: COLONOSCOPY WITH PROPOFOL: SHX5780

## 2023-01-13 HISTORY — PX: BIOPSY: SHX5522

## 2023-01-13 LAB — COMPREHENSIVE METABOLIC PANEL
ALT: 25 U/L (ref 0–44)
AST: 22 U/L (ref 15–41)
Albumin: 2.5 g/dL — ABNORMAL LOW (ref 3.5–5.0)
Alkaline Phosphatase: 39 U/L (ref 38–126)
Anion gap: 4 — ABNORMAL LOW (ref 5–15)
BUN: 10 mg/dL (ref 8–23)
CO2: 18 mmol/L — ABNORMAL LOW (ref 22–32)
Calcium: 8.3 mg/dL — ABNORMAL LOW (ref 8.9–10.3)
Chloride: 118 mmol/L — ABNORMAL HIGH (ref 98–111)
Creatinine, Ser: 0.79 mg/dL (ref 0.44–1.00)
GFR, Estimated: >60 mL/min (ref >60)
Glucose, Bld: 194 mg/dL — ABNORMAL HIGH (ref 70–99)
Potassium: 3.3 mmol/L — ABNORMAL LOW (ref 3.5–5.1)
Sodium: 140 mmol/L (ref 135–145)
Total Bilirubin: 0.4 mg/dL (ref <1.2)
Total Protein: 4.6 g/dL — ABNORMAL LOW (ref 6.5–8.1)

## 2023-01-13 LAB — CBC WITH DIFFERENTIAL/PLATELET
Abs Immature Granulocytes: 0.04 10*3/uL (ref 0.00–0.07)
Basophils Absolute: 0 10*3/uL (ref 0.0–0.1)
Basophils Relative: 1 %
Eosinophils Absolute: 0.1 10*3/uL (ref 0.0–0.5)
Eosinophils Relative: 3 %
HCT: 31.3 % — ABNORMAL LOW (ref 36.0–46.0)
Hemoglobin: 10.4 g/dL — ABNORMAL LOW (ref 12.0–15.0)
Immature Granulocytes: 1 %
Lymphocytes Relative: 22 %
Lymphs Abs: 0.9 10*3/uL (ref 0.7–4.0)
MCH: 30.1 pg (ref 26.0–34.0)
MCHC: 33.2 g/dL (ref 30.0–36.0)
MCV: 90.5 fL (ref 80.0–100.0)
Monocytes Absolute: 0.6 10*3/uL (ref 0.1–1.0)
Monocytes Relative: 15 %
Neutro Abs: 2.3 10*3/uL (ref 1.7–7.7)
Neutrophils Relative %: 58 %
Platelets: 250 10*3/uL (ref 150–400)
RBC: 3.46 MIL/uL — ABNORMAL LOW (ref 3.87–5.11)
RDW: 14.3 % (ref 11.5–15.5)
WBC: 3.9 10*3/uL — ABNORMAL LOW (ref 4.0–10.5)
nRBC: 0 % (ref 0.0–0.2)

## 2023-01-13 LAB — PHOSPHORUS: Phosphorus: 2.7 mg/dL (ref 2.5–4.6)

## 2023-01-13 LAB — GLUCOSE, CAPILLARY
Glucose-Capillary: 197 mg/dL — ABNORMAL HIGH (ref 70–99)
Glucose-Capillary: 167 mg/dL — ABNORMAL HIGH (ref 70–99)
Glucose-Capillary: 70 mg/dL (ref 70–99)
Glucose-Capillary: 144 mg/dL — ABNORMAL HIGH (ref 70–99)
Glucose-Capillary: 237 mg/dL — ABNORMAL HIGH (ref 70–99)

## 2023-01-13 LAB — MAGNESIUM: Magnesium: 1.6 mg/dL — ABNORMAL LOW (ref 1.7–2.4)

## 2023-01-13 SURGERY — COLONOSCOPY WITH PROPOFOL
Anesthesia: Monitor Anesthesia Care

## 2023-01-13 MED ORDER — LIDOCAINE HCL (CARDIAC) PF 100 MG/5ML IV SOSY
PREFILLED_SYRINGE | INTRAVENOUS | Status: DC | PRN
  Administered 2023-01-13: 30 mg via INTRAVENOUS

## 2023-01-13 MED ORDER — SODIUM CHLORIDE 0.9 % IV SOLN: INTRAVENOUS | Status: DC

## 2023-01-13 MED ORDER — POTASSIUM CHLORIDE CRYS ER 20 MEQ PO TBCR
40.0000 meq | EXTENDED_RELEASE_TABLET | Freq: Once | ORAL | Status: AC
  Administered 2023-01-13: 40 meq via ORAL
  Filled 2023-01-13: qty 2

## 2023-01-13 MED ORDER — PROPOFOL 500 MG/50ML IV EMUL
INTRAVENOUS | Status: DC | PRN
  Administered 2023-01-13: 100 ug/kg/min via INTRAVENOUS

## 2023-01-13 MED ORDER — RIVAROXABAN 20 MG PO TABS
20.0000 mg | ORAL_TABLET | Freq: Every day | ORAL | Status: DC
  Administered 2023-01-13 – 2023-01-16 (×4): 20 mg via ORAL
  Filled 2023-01-13 (×4): qty 1

## 2023-01-13 MED ORDER — DIPHENOXYLATE-ATROPINE 2.5-0.025 MG PO TABS
1.0000 | ORAL_TABLET | Freq: Two times a day (BID) | ORAL | Status: DC
  Administered 2023-01-13 – 2023-01-14 (×3): 1 via ORAL
  Filled 2023-01-13 (×3): qty 1

## 2023-01-13 MED ORDER — SODIUM CHLORIDE 0.9 % IV SOLN: INTRAVENOUS | Status: DC | PRN

## 2023-01-13 MED ORDER — MAGNESIUM SULFATE 4 GM/100ML IV SOLN
4.0000 g | Freq: Once | INTRAVENOUS | Status: AC
  Administered 2023-01-13: 4 g via INTRAVENOUS
  Filled 2023-01-13: qty 100

## 2023-01-13 MED ORDER — PROPOFOL 10 MG/ML IV BOLUS
INTRAVENOUS | Status: DC | PRN
  Administered 2023-01-13: 20 mg via INTRAVENOUS

## 2023-01-13 MED ORDER — SODIUM CHLORIDE 0.9 % IV SOLN
INTRAVENOUS | Status: AC | PRN
  Administered 2023-01-13: 250 mL via INTRAVENOUS

## 2023-01-13 SURGICAL SUPPLY — 20 items
ELECT REM PT RETURN 9FT ADLT (ELECTROSURGICAL)
ELECTRODE REM PT RTRN 9FT ADLT (ELECTROSURGICAL) IMPLANT
FCP BXJMBJMB 240X2.8X (CUTTING FORCEPS)
FLOOR PAD 36X40 (MISCELLANEOUS) ×2
FORCEPS BIOP RAD 4 LRG CAP 4 (CUTTING FORCEPS) IMPLANT
FORCEPS BXJMBJMB 240X2.8X (CUTTING FORCEPS) IMPLANT
INJECTOR/SNARE I SNARE (MISCELLANEOUS) IMPLANT
LUBRICANT JELLY 4.5OZ STERILE (MISCELLANEOUS) IMPLANT
MANIFOLD NEPTUNE II (INSTRUMENTS) IMPLANT
NDL SCLEROTHERAPY 25GX240 (NEEDLE) IMPLANT
NEEDLE SCLEROTHERAPY 25GX240 (NEEDLE) IMPLANT
PAD FLOOR 36X40 (MISCELLANEOUS) ×2 IMPLANT
PROBE APC STR FIRE (PROBE) IMPLANT
PROBE INJECTION GOLD 7FR (MISCELLANEOUS) IMPLANT
SNARE ROTATE MED OVAL 20MM (MISCELLANEOUS) IMPLANT
SYR 50ML LL SCALE MARK (SYRINGE) IMPLANT
TRAP SPECIMEN MUCOUS 40CC (MISCELLANEOUS) IMPLANT
TUBING ENDO SMARTCAP PENTAX (MISCELLANEOUS) IMPLANT
TUBING IRRIGATION ENDOGATOR (MISCELLANEOUS) ×2 IMPLANT
WATER STERILE IRR 1000ML POUR (IV SOLUTION) IMPLANT

## 2023-01-13 NOTE — Plan of Care (Signed)
  Problem: Education: Goal: Knowledge of General Education information will improve Description: Including pain rating scale, medication(s)/side effects and non-pharmacologic comfort measures Outcome: Progressing   Problem: Activity: Goal: Risk for activity intolerance will decrease Outcome: Progressing   Problem: Nutrition: Goal: Adequate nutrition will be maintained Outcome: Progressing   Problem: Coping: Goal: Level of anxiety will decrease Outcome: Progressing   Problem: Elimination: Goal: Will not experience complications related to bowel motility Outcome: Progressing   Problem: Pain Management: Goal: General experience of comfort will improve Outcome: Progressing   Problem: Safety: Goal: Ability to remain free from injury will improve Outcome: Progressing   Problem: Skin Integrity: Goal: Risk for impaired skin integrity will decrease Outcome: Progressing

## 2023-01-13 NOTE — Anesthesia Procedure Notes (Signed)
Procedure Name: MAC Date/Time: 01/13/2023 7:52 AM  Performed by: Gwenyth Allegra, CRNAPre-anesthesia Checklist: Patient identified, Emergency Drugs available, Suction available, Patient being monitored and Timeout performed Patient Re-evaluated:Patient Re-evaluated prior to induction Oxygen Delivery Method: Nasal cannula Preoxygenation: Pre-oxygenation with 100% oxygen Induction Type: IV induction

## 2023-01-13 NOTE — Interval H&P Note (Signed)
History and Physical Interval Note:  01/13/2023 7:22 AM  Donna Wiley  has presented today for surgery, with the diagnosis of Diarrhea.  The various methods of treatment have been discussed with the patient and family. After consideration of risks, benefits and other options for treatment, the patient has consented to  Procedure(s): COLONOSCOPY WITH PROPOFOL (N/A) as a surgical intervention.  The patient's history has been reviewed, patient examined, no change in status, stable for surgery.  I have reviewed the patient's chart and labs.  Questions were answered to the patient's satisfaction.     [name]

## 2023-01-13 NOTE — Progress Notes (Addendum)
PROGRESS NOTE    Donna Wiley  UUV:[id number] DOB: [birth year] DOA: 01/09/2023 PCP: Raquel James, MD  Chief Complaint  Patient presents with   Diarrhea    Hospital Course:  Donna Wiley is 82 y.o. female with paroxysmal atrial fibrillation on Xarelto, type 2 diabetes, GERD, hypertension, deficiency anemia, aortic stenosis, who presents to the emergency department for evaluation of diarrhea and abdominal pain.  Patient has had diarrhea for the last 12 days.  She reports that is nonbloody but watery.  No nausea no vomiting.  She has had anorexia due to fear of going to the bathroom.  She denies any recent antibiotics or history of C. difficile.  Denies any sick contacts.  Patient initially presented to outside hospital 2 weeks ago after 3 days of acute watery diarrhea.  She reports workup was negative at that time.  Thus far her GI PCR and C. difficile have been negative. Colonoscopy 12/13 unremarkable. Follow biopsies.  Subjective: Patient seen after colonoscopy this morning.  No abdominal pain currently.  She has not yet received first dose of Lomotil to determine its efficacy.  She has ordered solid food for lunch.  Objective: Vitals:   01/13/23 0815 01/13/23 0820 01/13/23 0830 01/13/23 0849  BP: 124/67 (!) 138/59 (!) 131/92 (!) 161/54  Pulse: 68 67 69 65  Resp: 15 18 17 16   Temp: 98 F (36.7 C)   97.8 F (36.6 C)  TempSrc: Tympanic   Oral  SpO2: 100% 98% 99% 100%  Weight:      Height:        Intake/Output Summary (Last 24 hours) at 01/13/2023 1606 Last data filed at 01/13/2023 0807 Gross per 24 hour  Intake 220 ml  Output --  Net 220 ml    Filed Weights   01/11/23 0459 01/12/23 0458 01/13/23 0419  Weight: 60.6 kg 60.1 kg 59.3 kg    Examination: General exam: Appears calm and comfortable, NAD  Respiratory system: No work of breathing, symmetric chest wall expansion Cardiovascular system: S1 & S2 heard, RRR.  Gastrointestinal system: Abdomen is  nondistended, soft and not tender to palpation.  No guarding Neuro: Alert and oriented. No focal neurological deficits. Extremities: Symmetric, expected ROM Skin: No rashes, lesions Psychiatry: Demonstrates appropriate judgement and insight. Mood & affect appropriate for situation.   Assessment & Plan:  Principal Problem:   Diarrhea Active Problems:   Gastroenteritis   Mesenteric lymphadenopathy   Stage 1 acute kidney injury (HCC)   Acute diarrhea  Diarrhea - Initially thought to be bacterial gastroenteritis in the setting of travel and acute onset - CT abdomen pelvis 12/6: Moderate mesenteric stranding and nodules progressively chronic since at least 2017.  No other findings of colitis. - GI PCR negative - C. difficile negative - TSH, HIV, lipase all negative - O&P pending - Status post fluid resuscitation - Fecal calprotectin elevated though this would be unusual age for new IBD - Patient reports last colonoscopy was 2023 which was normal - GI consulted -- Colonoscopy 12/13 grossly unremarkable biopsies taken, will follow .-Lomotil scheduled for now, monitor for response.  Chronic mesenteric stranding and nodules - Seen on outside CT scan in 2017, redemonstrated on outside CT 12/6: Moderate central mesenteric stranding with some small nodules.  Progressing from older exams.  No splenomegaly or pathological nodal enlargement.  Recommending follow-up CT in 3 to 6 months.  Discussed this with patient directly. - Follow-up CT outpt  AKI - Appears baseline creatinine is 0.9, was 1.29 on admission -  Suspect this is prerenal and in setting of diarrheal illness. - Has resolved now.  Will continue to follow with daily CMP  Leukopenia - WBC 4.6 on arrival -> 3.0 -> 3.9 - Suspect secondary to infection as above, she remains afebrile - HIV negative - decreased RBCs - Peripheral smear reveals atypical lymphocytes, teardrop cells, burr cells, giant platelets: May be secondary to acute  viral infection.  Discussed these findings with hematology Dr. Cherly Hensen, reports this is all likely secondary to infection, repeat peripheral smear when infection is resolved with PCP in 3 weeks  Generalized weakness - Due to diarrhea and electrolyte deficiencies as above - PT/OT  Hypokalemia Hypomagnesemia - Replace as needed - Due to diarrhea - Trend CMP  Type 1 diabetes, long-term use of insulin - On insulin pump, will continue with patient management of insulin pump - Continue CBG  Atrial fibrillation on anticoagulation - Continue home dose flecainide, diltiazem -- Resume Xarelto today  Aortic stenosis - Continue outpatient monitoring  Hypertension - Continue home meds for now - Hold ARB/HCTZ due to AKI and volume depletion  Hyperlipidemia - Continue statin  GERD Duodenitis - Continue PPI  History of IJ thrombus Cardiovascular disease - Home meds as above  Iron deficiency anemia - Hgb stable for now - Ferrous sulfate       DVT prophylaxis: Resume Xarelto now   Code Status: Full Code Family Communication: None at bedside. Discussed directly with pt Disposition:  Status is: inpt for intractable diarrhea and ongoing work up    Consultants:  GI    Procedures:  N/a  Antimicrobials:  Anti-infectives (From admission, onward)    None       Data Reviewed: I have personally reviewed following labs and imaging studies CBC: Recent Labs  Lab 01/10/23 0557 01/11/23 0547 01/11/23 1541 01/12/23 0532 01/13/23 0548  WBC 3.0* 3.9* 4.4 4.8 3.9*  NEUTROABS  --   --  2.9 2.8 2.3  HGB 9.6* 9.6* 10.9* 10.5* 10.4*  HCT 28.3* 29.3* 32.6* 31.7* 31.3*  MCV 89.0 90.7 90.3 90.8 90.5  PLT 256 241 267 267 250   Basic Metabolic Panel: Recent Labs  Lab 01/09/23 1753 01/09/23 2128 01/10/23 0557 01/11/23 0547 01/12/23 0532 01/13/23 0548  NA 135  --  140 139 138 140  K 3.1*  --  3.4* 3.7 3.7 3.3*  CL 107  --  113* 120* 115* 118*  CO2 18*  --  21* 17* 19* 18*   GLUCOSE 166*  --  117* 152* 163* 194*  BUN 41*  --  29* 18 12 10   CREATININE 1.29*  --  0.99 0.90 0.77 0.79  CALCIUM 8.8*  --  8.3* 7.7* 8.2* 8.3*  MG  --  1.9 1.9 1.8 1.8 1.6*  PHOS  --   --  1.7* 2.0* 1.8* 2.7   GFR: Estimated Creatinine Clearance: 42.1 mL/min (by C-G formula based on SCr of 0.79 mg/dL). Liver Function Tests: Recent Labs  Lab 01/09/23 1753 01/11/23 0547 01/12/23 0532 01/13/23 0548  AST 35 22 25 22   ALT 41 27 27 25   ALKPHOS 44 35* 39 39  BILITOT 0.8 0.4 0.5 0.4  PROT 5.9* 4.3* 4.8* 4.6*  ALBUMIN 3.3* 2.3* 2.6* 2.5*   CBG: Recent Labs  Lab 01/12/23 2013 01/12/23 2324 01/13/23 0416 01/13/23 0847 01/13/23 1153  GLUCAP 145* 206* 197* 167* 70    Recent Results (from the past 240 hours)  Gastrointestinal Panel by PCR , Stool     Status: None  Collection Time: 01/09/23  9:28 PM   Specimen: Urine, Clean Catch; Stool  Result Value Ref Range Status   Campylobacter species NOT DETECTED NOT DETECTED Final   Plesimonas shigelloides NOT DETECTED NOT DETECTED Final   Salmonella species NOT DETECTED NOT DETECTED Final   Yersinia enterocolitica NOT DETECTED NOT DETECTED Final   Vibrio species NOT DETECTED NOT DETECTED Final   Vibrio cholerae NOT DETECTED NOT DETECTED Final   Enteroaggregative E coli (EAEC) NOT DETECTED NOT DETECTED Final   Enteropathogenic E coli (EPEC) NOT DETECTED NOT DETECTED Final   Enterotoxigenic E coli (ETEC) NOT DETECTED NOT DETECTED Final   Shiga like toxin producing E coli (STEC) NOT DETECTED NOT DETECTED Final   Shigella/Enteroinvasive E coli (EIEC) NOT DETECTED NOT DETECTED Final   Cryptosporidium NOT DETECTED NOT DETECTED Final   Cyclospora cayetanensis NOT DETECTED NOT DETECTED Final   Entamoeba histolytica NOT DETECTED NOT DETECTED Final   Giardia lamblia NOT DETECTED NOT DETECTED Final   Adenovirus F40/41 NOT DETECTED NOT DETECTED Final   Astrovirus NOT DETECTED NOT DETECTED Final   Norovirus GI/GII NOT DETECTED NOT DETECTED  Final   Rotavirus A NOT DETECTED NOT DETECTED Final   Sapovirus (I, II, IV, and V) NOT DETECTED NOT DETECTED Final    Comment: Performed at [facility], [address] [place], [place] [address]  C Difficile Quick Screen w PCR reflex     Status: None   Collection Time: 01/09/23  9:28 PM  Result Value Ref Range Status   C Diff antigen NEGATIVE NEGATIVE Final   C Diff toxin NEGATIVE NEGATIVE Final   C Diff interpretation No C. difficile detected.  Final    Comment: Performed at [facility], [address] [place], [place] [address]  Calprotectin, Fecal     Status: Abnormal   Collection Time: 01/10/23  2:04 AM   Specimen: Stool  Result Value Ref Range Status   Calprotectin, Fecal 297 (H) 0 - 120 ug/g Final    Comment: (NOTE) Concentration     Interpretation   Follow-Up < 5 - 50 ug/g     Normal           None >50 -120 ug/g     Borderline       Re-evaluate in 4-6 weeks    >120 ug/g     Abnormal         Repeat as clinically                                   indicated Performed At: [facility] [address] [place], [place] [id number] Jolene Schimke MD WG:[id number]      Radiology Studies: No results found.  Scheduled Meds:  diltiazem  180 mg Oral Daily   diphenoxylate-atropine  1 tablet Oral BID   flecainide  100 mg Oral QHS   flecainide  50 mg Oral Daily   hydrocortisone   Rectal BID   insulin pump   Subcutaneous TID WC, HS, 0200   simvastatin  20 mg Oral QHS   sodium chloride flush  3 mL Intravenous Q12H   Continuous Infusions:   LOS: 1 day    Time spent:  Debarah Crape, DO [facility]  To contact the attending physician between 7A-7P please use Epic Chat. To contact the covering physician during after hours 7P-7A, please review Amion.   01/13/2023, 4:06 PM   *This document has  been created with the assistance of dictation software. Please excuse typographical errors. *

## 2023-01-13 NOTE — Anesthesia Postprocedure Evaluation (Signed)
Anesthesia Post Note  Patient: Donna Wiley  Procedure(s) Performed: COLONOSCOPY WITH PROPOFOL BIOPSY     Patient location during evaluation: Endoscopy Anesthesia Type: MAC Level of consciousness: awake and alert Pain management: pain level controlled Vital Signs Assessment: post-procedure vital signs reviewed and stable Respiratory status: spontaneous breathing, nonlabored ventilation, respiratory function stable and patient connected to nasal cannula oxygen Cardiovascular status: blood pressure returned to baseline and stable Postop Assessment: no apparent nausea or vomiting Anesthetic complications: no  No notable events documented.  Last Vitals:  Vitals:   01/13/23 0830 01/13/23 0849  BP: (!) 131/92 (!) 161/54  Pulse: 69 65  Resp: 17 16  Temp:  36.6 C  SpO2: 99% 100%    Last Pain:  Vitals:   01/13/23 0849  TempSrc: Oral  PainSc:                  [name]

## 2023-01-13 NOTE — Op Note (Signed)
[facility] Patient Name: Donna Wiley Procedure Date : 01/13/2023 MRN: [id number] Attending MD: Starr Lake. Myrtie Neither , MD, [id number] Date of Birth: 10-02-1940 CSN: [id number] Age: 82 Admit Type: Inpatient Procedure:                Colonoscopy Indications:              Clinically significant acute diarrhea of                            unexplained origin                           2 weeks, watery diarrhea. clinical details in                            consult note. Negative infectious testing, elevated                            fecal calprotectin (297) Providers:                Sherilyn Cooter L. Myrtie Neither, MD, Stephens Shire RN, RN, Rozetta Nunnery, Technician Referring MD:             [facility] Medicines:                Monitored Anesthesia Care Complications:            No immediate complications. Estimated Blood Loss:     Estimated blood loss was minimal. Procedure:                Pre-Anesthesia Assessment:                           - Prior to the procedure, a History and Physical                            was performed, and patient medications and                            allergies were reviewed. The patient's tolerance of                            previous anesthesia was also reviewed. The risks                            and benefits of the procedure and the sedation                            options and risks were discussed with the patient.                            All questions were answered, and informed consent                            was obtained. Prior Anticoagulants: The  patient has                            taken Xarelto (rivaroxaban), last dose was 2 days                            prior to procedure. ASA Grade Assessment: III - A                            patient with severe systemic disease. After                            reviewing the risks and benefits, the patient was                            deemed in satisfactory  condition to undergo the                            procedure.                           After obtaining informed consent, the colonoscope                            was passed under direct vision. Throughout the                            procedure, the patient's blood pressure, pulse, and                            oxygen saturations were monitored continuously. The                            CF-HQ190L ([id number] Olympus coloscope was                            introduced through the anus and advanced to the the                            terminal ileum, with identification of the                            appendiceal orifice and IC valve. The colonoscopy                            was performed without difficulty. The patient                            tolerated the procedure well. The quality of the                            bowel preparation was excellent. The terminal  ileum, ileocecal valve, appendiceal orifice, and                            rectum were photographed. The bowel preparation                            used was MoviPrep via split dose instruction. Scope In: 7:53:50 AM Scope Out: 8:06:26 AM Scope Withdrawal Time: 0 hours 8 minutes 27 seconds  Total Procedure Duration: 0 hours 12 minutes 36 seconds  Findings:      The perianal and digital rectal examinations were normal.      The terminal ileum appeared normal.      Normal mucosa was found in the entire colon. Biopsies for histology were       taken with a cold forceps from the ascending colon, transverse colon and       sigmoid colon for evaluation of microscopic colitis.      The entire examined colon appeared normal on direct and retroflexion       views. Impression:               - The examined portion of the ileum was normal.                           - Normal mucosa in the entire examined colon.                            Biopsied.                           - The entire examined colon  is normal on direct and                            retroflexion views. Recommendation:           - Return patient to hospital ward for ongoing care.                           - Resume regular diet.                           - Continue present medications.                           - Await pathology results. Those results will be                            reviewed by our office and communicated to the                            patient and forwarded to her primary GI physician                            after hospital discharge.                           -When this patient's diarrhea is sufficiently  controlled with Lomotil that she can maintain                            hydration and electrolyte balance, that she can be                            discharged home for outpatient follow-up with her                            primary GI physician (Dr. Karena Addison).                           - Resume Xarelto (rivaroxaban) at prior dose today.                           - Inpatient GI service signing off-call as needed. Procedure Code(s):        --- Professional ---                           [phone], Colonoscopy, flexible; with biopsy, single                            or multiple Diagnosis Code(s):        --- Professional ---                           R19.7, Diarrhea, unspecified CPT copyright 2022 American Medical Association. All rights reserved. The codes documented in this report are preliminary and upon coder review may  be revised to meet current compliance requirements. Cornelius Schuitema L. Myrtie Neither, MD 01/13/2023 8:14:40 AM This report has been signed electronically. Number of Addenda: 0

## 2023-01-13 NOTE — Transfer of Care (Signed)
Immediate Anesthesia Transfer of Care Note  Patient: Donna Wiley  Procedure(s) Performed: COLONOSCOPY WITH PROPOFOL BIOPSY  Patient Location: PACU and Endoscopy Unit  Anesthesia Type:MAC  Level of Consciousness: awake, alert , and oriented  Airway & Oxygen Therapy: Patient Spontanous Breathing  Post-op Assessment: Report given to RN and Post -op Vital signs reviewed and stable  Post vital signs: Reviewed and stable  Last Vitals:  Vitals Value Taken Time  BP 124/67 01/13/23 0814  Temp    Pulse 68 01/13/23 0815  Resp 15 01/13/23 0815  SpO2 100 % 01/13/23 0815  Vitals shown include unfiled device data.  Last Pain:  Vitals:   01/13/23 0705  TempSrc: Tympanic  PainSc: 0-No pain      Patients Stated Pain Goal: 0 (01/09/23 2350)  Complications: No notable events documented.

## 2023-01-14 DIAGNOSIS — R197 Diarrhea, unspecified: Secondary | ICD-10-CM | POA: Diagnosis not present

## 2023-01-14 DIAGNOSIS — Z7901 Long term (current) use of anticoagulants: Secondary | ICD-10-CM

## 2023-01-14 DIAGNOSIS — R933 Abnormal findings on diagnostic imaging of other parts of digestive tract: Secondary | ICD-10-CM | POA: Diagnosis not present

## 2023-01-14 DIAGNOSIS — I4891 Unspecified atrial fibrillation: Secondary | ICD-10-CM

## 2023-01-14 DIAGNOSIS — R194 Change in bowel habit: Secondary | ICD-10-CM

## 2023-01-14 DIAGNOSIS — E876 Hypokalemia: Secondary | ICD-10-CM | POA: Diagnosis not present

## 2023-01-14 LAB — CBC WITH DIFFERENTIAL/PLATELET
Abs Immature Granulocytes: 0.03 10*3/uL (ref 0.00–0.07)
Basophils Absolute: 0 10*3/uL (ref 0.0–0.1)
Basophils Relative: 1 %
Eosinophils Absolute: 0.1 10*3/uL (ref 0.0–0.5)
Eosinophils Relative: 2 %
HCT: 32.2 % — ABNORMAL LOW (ref 36.0–46.0)
Hemoglobin: 10.6 g/dL — ABNORMAL LOW (ref 12.0–15.0)
Immature Granulocytes: 1 %
Lymphocytes Relative: 15 %
Lymphs Abs: 1 10*3/uL (ref 0.7–4.0)
MCH: 29.4 pg (ref 26.0–34.0)
MCHC: 32.9 g/dL (ref 30.0–36.0)
MCV: 89.4 fL (ref 80.0–100.0)
Monocytes Absolute: 0.7 10*3/uL (ref 0.1–1.0)
Monocytes Relative: 10 %
Neutro Abs: 4.7 10*3/uL (ref 1.7–7.7)
Neutrophils Relative %: 71 %
Platelets: 252 10*3/uL (ref 150–400)
RBC: 3.6 MIL/uL — ABNORMAL LOW (ref 3.87–5.11)
RDW: 14.2 % (ref 11.5–15.5)
WBC: 6.5 10*3/uL (ref 4.0–10.5)
nRBC: 0 % (ref 0.0–0.2)

## 2023-01-14 LAB — COMPREHENSIVE METABOLIC PANEL
ALT: 24 U/L (ref 0–44)
AST: 24 U/L (ref 15–41)
Albumin: 2.6 g/dL — ABNORMAL LOW (ref 3.5–5.0)
Alkaline Phosphatase: 39 U/L (ref 38–126)
Anion gap: 6 (ref 5–15)
BUN: 14 mg/dL (ref 8–23)
CO2: 17 mmol/L — ABNORMAL LOW (ref 22–32)
Calcium: 7.9 mg/dL — ABNORMAL LOW (ref 8.9–10.3)
Chloride: 115 mmol/L — ABNORMAL HIGH (ref 98–111)
Creatinine, Ser: 0.77 mg/dL (ref 0.44–1.00)
GFR, Estimated: >60 mL/min (ref >60)
Glucose, Bld: 74 mg/dL (ref 70–99)
Potassium: 3.4 mmol/L — ABNORMAL LOW (ref 3.5–5.1)
Sodium: 138 mmol/L (ref 135–145)
Total Bilirubin: 0.5 mg/dL (ref <1.2)
Total Protein: 4.8 g/dL — ABNORMAL LOW (ref 6.5–8.1)

## 2023-01-14 LAB — GLUCOSE, CAPILLARY
Glucose-Capillary: 106 mg/dL — ABNORMAL HIGH (ref 70–99)
Glucose-Capillary: 49 mg/dL — ABNORMAL LOW (ref 70–99)
Glucose-Capillary: 71 mg/dL (ref 70–99)
Glucose-Capillary: 153 mg/dL — ABNORMAL HIGH (ref 70–99)
Glucose-Capillary: 135 mg/dL — ABNORMAL HIGH (ref 70–99)
Glucose-Capillary: 164 mg/dL — ABNORMAL HIGH (ref 70–99)
Glucose-Capillary: 189 mg/dL — ABNORMAL HIGH (ref 70–99)

## 2023-01-14 LAB — MAGNESIUM: Magnesium: 2.4 mg/dL (ref 1.7–2.4)

## 2023-01-14 LAB — PHOSPHORUS: Phosphorus: 2.1 mg/dL — ABNORMAL LOW (ref 2.5–4.6)

## 2023-01-14 MED ORDER — DIPHENOXYLATE-ATROPINE 2.5-0.025 MG PO TABS: 1.0000 | ORAL_TABLET | Freq: Four times a day (QID) | ORAL | Status: DC

## 2023-01-14 MED ORDER — CHOLESTYRAMINE 4 G PO PACK
4.0000 g | PACK | Freq: Every day | ORAL | Status: DC
  Administered 2023-01-14 – 2023-01-15 (×2): 4 g via ORAL
  Filled 2023-01-14 (×2): qty 1

## 2023-01-14 MED ORDER — DIPHENOXYLATE-ATROPINE 2.5-0.025 MG PO TABS
1.0000 | ORAL_TABLET | Freq: Two times a day (BID) | ORAL | Status: DC
  Administered 2023-01-14 – 2023-01-17 (×7): 1 via ORAL
  Filled 2023-01-14 (×7): qty 1

## 2023-01-14 MED ORDER — COLESTIPOL HCL 1 G PO TABS
1.0000 g | ORAL_TABLET | Freq: Two times a day (BID) | ORAL | Status: DC
  Filled 2023-01-14: qty 1

## 2023-01-14 MED ORDER — CHOLESTYRAMINE 4 G PO PACK: 4.0000 g | PACK | Freq: Two times a day (BID) | ORAL | Status: DC

## 2023-01-14 MED ORDER — POTASSIUM CHLORIDE CRYS ER 20 MEQ PO TBCR
40.0000 meq | EXTENDED_RELEASE_TABLET | Freq: Once | ORAL | Status: AC
  Administered 2023-01-14: 40 meq via ORAL
  Filled 2023-01-14: qty 2

## 2023-01-14 NOTE — Progress Notes (Signed)
Hypoglycemic Event  CBG: 49  Treatment: 8 oz juice/soda  Symptoms: None  Follow-up CBG: Time:4AM  CBG Result:71   Possible Reasons for Event: Medication regimen:    Comments/MD notified:yes     Caryn Section

## 2023-01-14 NOTE — Progress Notes (Signed)
Mobility Specialist Progress Note;   01/14/23 1609  Mobility  Activity Refused mobility   Pt refusing mobility at this time d/t constant diarrhea and nervous about not being able to make it to the BR as it comes quick. Stated she's been going back and forth from the bed to BR today and would rather hold off for now. Will f/u as schedule permits.   Caesar Bookman Mobility Specialist Please contact via SecureChat or [employer] [phone]

## 2023-01-14 NOTE — Progress Notes (Addendum)
Attending physician's note   I have taken a history, reviewed the chart, and examined the patient. I performed a substantive portion of this encounter, including complete performance of at least one of the key components, in conjunction with the APP. I agree with the APP's note, impression, and recommendations with my edits.  Unfortunately has had return of watery, nonbloody diarrhea with fecal urgency.  Colonoscopy on 12/13 was normal, and biopsies pending to evaluate for microscopic colitis.  Will try adding cholestyramine (history of cholecystectomy but also part of the treatment algorithm for microscopic colitis).  Will start with low-dose at 4 g daily and this elderly patient who is also on Lomotil and titrate up as needed.  Will continue Lomotil which was just started yesterday.  Continue hydration.  GI service will continue to follow.  [address] DO, FACG [phone] office          Progress Note   Subjective  Chief Complaint: Diarrhea  Status post colonoscopy 01/13/2023, entire examined colon was normal, biopsies pending for microscopic colitis.  Patient started on Lomotil twice a day yesterday  This morning patient tells me that she had not had a bowel movement at all until about 3:00 this morning when she started with more liquid stools.  So far since waking up officially this morning she has had 4 more loose stools, worse after eating breakfast.  She is about to try some lunch.  Continues to only have a small amount of abdominal discomfort prior to a bowel movement relieved afterwards.  She has had 2 doses of Lomotil.   Objective   Vital signs in last 24 hours: Temp:  [97.6 F (36.4 C)-99.1 F (37.3 C)] 98.1 F (36.7 C) (12/14 0836) Pulse Rate:  [74-88] 88 (12/14 0836) Resp:  [16-20] 18 (12/14 0836) BP: (122-150)/(60-67) 124/67 (12/14 0836) SpO2:  [98 %-100 %] 98 % (12/14 0836) Weight:  [61.9 kg] 61.9 kg (12/14 0500) Last BM Date : 01/14/23 General:    Elderly  white female in NAD Heart:  Regular rate and rhythm; no murmurs Lungs: Respirations even and unlabored, lungs CTA bilaterally Abdomen:  Soft, nontender and nondistended. Normal bowel sounds. Psych:  Cooperative. Normal mood and affect.  Intake/Output from previous day: 12/13 0701 - 12/14 0700 In: 570.4 [P.O.:460; I.V.:100; IV Piggyback:10.4] Out: -   Lab Results: Recent Labs    01/12/23 0532 01/13/23 0548 01/14/23 0406  WBC 4.8 3.9* 6.5  HGB 10.5* 10.4* 10.6*  HCT 31.7* 31.3* 32.2*  PLT 267 250 252   BMET Recent Labs    01/12/23 0532 01/13/23 0548 01/14/23 0406  NA 138 140 138  K 3.7 3.3* 3.4*  CL 115* 118* 115*  CO2 19* 18* 17*  GLUCOSE 163* 194* 74  BUN 12 10 14   CREATININE 0.77 0.79 0.77  CALCIUM 8.2* 8.3* 7.9*   LFT Recent Labs    01/14/23 0406  PROT 4.8*  ALBUMIN 2.6*  AST 24  ALT 24  ALKPHOS 39  BILITOT 0.5    Assessment / Plan:   Assessment: 1.  Diarrhea: Acute onset of diarrhea around Thanksgiving, 12/22/2022, CT with mesenteric stranding and nodules progressive on CT 12/7, C. difficile and GI path panel negative, status post cholecystectomy years ago, Imodium initially helpful in hospital but then continued with watery stools now status post colonoscopy on 01/13/2023 with biopsies pending for microscopic colitis, further watery bowel movements overnight regardless of addition of Lomotil on 01/13/2023, she is status post cholecystectomy which could  be contributing 2.  Abnormal CT of the abdomen: With progressive mesenteric stranding and nodules, thought non-specific 3.  Hypokalemia 4.  A-fib on Xarelto  Plan: 1.  Discussed that pathology results from colonoscopy may not come back until Tuesday of next week given the pathology does not work over the weekends and her procedure was on Friday. 2.  Will continue Lomotil which was started yesterday twice daily for now. 3.  Added cholestyramine 4 g daily 4.  Continue to monitor electrolytes and  replenish as needed 5.  Continue current diet 6.  Could consider increasing Lomotil in the future pending results from Colestipol addition  Thank you for your kind consultation, we will continue to follow along.   LOS: 2 days   Unk Lightning  01/14/2023, 1:43 PM

## 2023-01-14 NOTE — Progress Notes (Signed)
PROGRESS NOTE    Donna Wiley  OZH:[id number] DOB: September 19, 1940 DOA: 01/09/2023 PCP: Raquel James, MD  Chief Complaint  Patient presents with   Diarrhea    Hospital Course:  Donna Wiley is 82 y.o. female with paroxysmal atrial fibrillation on Xarelto, type 2 diabetes, GERD, hypertension, deficiency anemia, aortic stenosis, who presents to the emergency department for evaluation of diarrhea and abdominal pain.  Patient has had diarrhea for the last 12 days.  She reports that is nonbloody but watery.  No nausea no vomiting.  She has had anorexia due to fear of going to the bathroom.  She denies any recent antibiotics or history of C. difficile.  Denies any sick contacts.  Patient initially presented to outside hospital 2 weeks ago after 3 days of acute watery diarrhea.  She reports workup was negative at that time.  Thus far her GI PCR and C. difficile have been negative. Colonoscopy 12/13 unremarkable. Follow biopsies.  Subjective: Despite some success with Lomotil yesterday patient reports her diarrhea has returned today.  She had 4 bowel movements in 3 hours, she is also experiencing increasing urgency and was unable to make it to the bathroom.  Additionally she is becoming increasingly hypoglycemic.  Objective: Vitals:   01/14/23 0345 01/14/23 0500 01/14/23 0836 01/14/23 1633  BP: 130/67  124/67 130/62  Pulse: 75  88 76  Resp: 20  18 18   Temp: 97.6 F (36.4 C)  98.1 F (36.7 C) 98.3 F (36.8 C)  TempSrc: Oral  Oral Oral  SpO2: 100%  98% 100%  Weight:  61.9 kg    Height:        Intake/Output Summary (Last 24 hours) at 01/14/2023 1730 Last data filed at 01/14/2023 0400 Gross per 24 hour  Intake 470.43 ml  Output --  Net 470.43 ml    Filed Weights   01/12/23 0458 01/13/23 0419 01/14/23 0500  Weight: 60.1 kg 59.3 kg 61.9 kg    Examination: General exam: Appears calm and comfortable, NAD  Respiratory system: No work of breathing, symmetric chest wall  expansion Cardiovascular system: S1 & S2 heard, RRR.  Gastrointestinal system: Abdomen is nondistended, soft and not tender to palpation.  No guarding Neuro: Alert and oriented. No focal neurological deficits. Extremities: Symmetric, expected ROM Skin: No rashes, lesions Psychiatry: Demonstrates appropriate judgement and insight. Mood & affect appropriate for situation.   Assessment & Plan:  Principal Problem:   Diarrhea Active Problems:   Gastroenteritis   Mesenteric lymphadenopathy   Stage 1 acute kidney injury (HCC)   Acute diarrhea   Change in bowel habits  Diarrhea - Initially thought to be bacterial gastroenteritis in the setting of travel and acute onset - CT abdomen pelvis 12/6: Moderate mesenteric stranding and nodules progressively chronic since at least 2017.  No other findings of colitis. - GI PCR negative - C. difficile negative - TSH, HIV, lipase all negative - O&P pending - Status post fluid resuscitation - Fecal calprotectin elevated though this would be unusual age for new IBD - Patient reports last colonoscopy was 2023 which was normal - GI consulted -- Colonoscopy 12/13 grossly unremarkable biopsies taken, will follow .-Failed trial of Imodium - Now on scheduled Lomotil, given increasing diarrhea today will also add cholestyramine.  Appreciate further GI recommendations.  Chronic mesenteric stranding and nodules - Seen on outside CT scan in 2017, redemonstrated on outside CT 12/6: Moderate central mesenteric stranding with some small nodules.  Progressing from older exams.  No splenomegaly or pathological  nodal enlargement.  Recommending follow-up CT in 3 to 6 months.  Discussed this with patient directly. - Follow-up CT outpt  AKI - Appears baseline creatinine is 0.9, was 1.29 on admission - Suspect this is prerenal and in setting of diarrheal illness. - Has resolved now.  Will continue to follow with daily CMP  Leukopenia - WBC 4.6 on arrival -> 3.0 ->  3.9 -> 6.5 - Suspect secondary to infection as above, she remains afebrile - HIV negative - Peripheral smear reveals atypical lymphocytes, teardrop cells, burr cells, giant platelets:   Discussed these findings with hematology Dr. Cherly Hensen, reports this is all likely secondary to infection, repeat peripheral smear when infection is resolved with PCP in 3 weeks  Generalized weakness - Due to diarrhea and electrolyte deficiencies as above - PT/OT  Hypokalemia Hypomagnesemia - Replace as needed - Due to diarrhea - Trend CMP  Type 1 diabetes, long-term use of insulin Hypoglycemia - On insulin pump, will continue with patient management of insulin pump - Continue CBG - Have instructed patient to discontinue all insulin and tolerate slightly higher blood glucose up to 180 given her recurrent hypoglycemia which is secondary to her ongoing diarrhea.  Atrial fibrillation on anticoagulation - Continue home dose flecainide, diltiazem -- Resume Xarelto today  Aortic stenosis - Continue outpatient monitoring  Hypertension - Continue home meds for now - Hold ARB/HCTZ due to AKI and volume depletion  Hyperlipidemia - Continue statin  GERD Duodenitis - Continue PPI  History of IJ thrombus Cardiovascular disease - Home meds as above  Iron deficiency anemia - Hgb stable for now - Ferrous sulfate       DVT prophylaxis: Resume Xarelto now   Code Status: Full Code Family Communication: None at bedside. Discussed directly with pt Disposition:  Status is: inpt for intractable diarrhea and ongoing work up    Consultants:  GI  Treatment Team:  Consulting Physician: Shellia Cleverly, DO  Procedures:  N/a  Antimicrobials:  Anti-infectives (From admission, onward)    None       Data Reviewed: I have personally reviewed following labs and imaging studies CBC: Recent Labs  Lab 01/11/23 0547 01/11/23 1541 01/12/23 0532 01/13/23 0548 01/14/23 0406  WBC 3.9* 4.4 4.8  3.9* 6.5  NEUTROABS  --  2.9 2.8 2.3 4.7  HGB 9.6* 10.9* 10.5* 10.4* 10.6*  HCT 29.3* 32.6* 31.7* 31.3* 32.2*  MCV 90.7 90.3 90.8 90.5 89.4  PLT 241 267 267 250 252   Basic Metabolic Panel: Recent Labs  Lab 01/10/23 0557 01/11/23 0547 01/12/23 0532 01/13/23 0548 01/14/23 0406  NA 140 139 138 140 138  K 3.4* 3.7 3.7 3.3* 3.4*  CL 113* 120* 115* 118* 115*  CO2 21* 17* 19* 18* 17*  GLUCOSE 117* 152* 163* 194* 74  BUN 29* 18 12 10 14   CREATININE 0.99 0.90 0.77 0.79 0.77  CALCIUM 8.3* 7.7* 8.2* 8.3* 7.9*  MG 1.9 1.8 1.8 1.6* 2.4  PHOS 1.7* 2.0* 1.8* 2.7 2.1*   GFR: Estimated Creatinine Clearance: 46.1 mL/min (by C-G formula based on SCr of 0.77 mg/dL). Liver Function Tests: Recent Labs  Lab 01/09/23 1753 01/11/23 0547 01/12/23 0532 01/13/23 0548 01/14/23 0406  AST 35 22 25 22 24   ALT 41 27 27 25 24   ALKPHOS 44 35* 39 39 39  BILITOT 0.8 0.4 0.5 0.4 0.5  PROT 5.9* 4.3* 4.8* 4.6* 4.8*  ALBUMIN 3.3* 2.3* 2.6* 2.5* 2.6*   CBG: Recent Labs  Lab 01/14/23 0337 01/14/23 0401  01/14/23 0833 01/14/23 1156 01/14/23 1631  GLUCAP 49* 71 153* 135* 164*    Recent Results (from the past 240 hours)  Gastrointestinal Panel by PCR , Stool     Status: None   Collection Time: 01/09/23  9:28 PM   Specimen: Urine, Clean Catch; Stool  Result Value Ref Range Status   Campylobacter species NOT DETECTED NOT DETECTED Final   Plesimonas shigelloides NOT DETECTED NOT DETECTED Final   Salmonella species NOT DETECTED NOT DETECTED Final   Yersinia enterocolitica NOT DETECTED NOT DETECTED Final   Vibrio species NOT DETECTED NOT DETECTED Final   Vibrio cholerae NOT DETECTED NOT DETECTED Final   Enteroaggregative E coli (EAEC) NOT DETECTED NOT DETECTED Final   Enteropathogenic E coli (EPEC) NOT DETECTED NOT DETECTED Final   Enterotoxigenic E coli (ETEC) NOT DETECTED NOT DETECTED Final   Shiga like toxin producing E coli (STEC) NOT DETECTED NOT DETECTED Final   Shigella/Enteroinvasive E coli  (EIEC) NOT DETECTED NOT DETECTED Final   Cryptosporidium NOT DETECTED NOT DETECTED Final   Cyclospora cayetanensis NOT DETECTED NOT DETECTED Final   Entamoeba histolytica NOT DETECTED NOT DETECTED Final   Giardia lamblia NOT DETECTED NOT DETECTED Final   Adenovirus F40/41 NOT DETECTED NOT DETECTED Final   Astrovirus NOT DETECTED NOT DETECTED Final   Norovirus GI/GII NOT DETECTED NOT DETECTED Final   Rotavirus A NOT DETECTED NOT DETECTED Final   Sapovirus (I, II, IV, and V) NOT DETECTED NOT DETECTED Final    Comment: Performed at [facility], [address] [place], [place] [address]  C Difficile Quick Screen w PCR reflex     Status: None   Collection Time: 01/09/23  9:28 PM  Result Value Ref Range Status   C Diff antigen NEGATIVE NEGATIVE Final   C Diff toxin NEGATIVE NEGATIVE Final   C Diff interpretation No C. difficile detected.  Final    Comment: Performed at [facility], [address] [place], [place] [address]  Calprotectin, Fecal     Status: Abnormal   Collection Time: 01/10/23  2:04 AM   Specimen: Stool  Result Value Ref Range Status   Calprotectin, Fecal 297 (H) 0 - 120 ug/g Final    Comment: (NOTE) Concentration     Interpretation   Follow-Up < 5 - 50 ug/g     Normal           None >50 -120 ug/g     Borderline       Re-evaluate in 4-6 weeks    >120 ug/g     Abnormal         Repeat as clinically                                   indicated Performed At: [facility] [address] [place], [place] [id number] Jolene Schimke MD AO:[id number]      Radiology Studies: No results found.  Scheduled Meds:  cholestyramine  4 g Oral Daily   diltiazem  180 mg Oral Daily   diphenoxylate-atropine  1 tablet Oral BID   flecainide  100 mg Oral QHS   flecainide  50 mg Oral Daily   hydrocortisone   Rectal BID   insulin pump   Subcutaneous TID WC, HS, 0200   rivaroxaban  20 mg Oral Q supper   simvastatin  20 mg Oral QHS   sodium chloride flush  3 mL  Intravenous  Q12H   Continuous Infusions:   LOS: 2 days    Time spent:  Debarah Crape, DO [facility]  To contact the attending physician between 7A-7P please use Epic Chat. To contact the covering physician during after hours 7P-7A, please review Amion.   01/14/2023, 5:30 PM   *This document has been created with the assistance of dictation software. Please excuse typographical errors. *

## 2023-01-14 NOTE — Progress Notes (Signed)
CBG at 2203 was189 patient was on insulin pump but not covering today as per MD advised it since her blood sugar bottomed down this early morning, notified to NP.

## 2023-01-15 DIAGNOSIS — R197 Diarrhea, unspecified: Secondary | ICD-10-CM | POA: Diagnosis not present

## 2023-01-15 DIAGNOSIS — R933 Abnormal findings on diagnostic imaging of other parts of digestive tract: Secondary | ICD-10-CM | POA: Diagnosis not present

## 2023-01-15 DIAGNOSIS — E876 Hypokalemia: Secondary | ICD-10-CM | POA: Diagnosis not present

## 2023-01-15 DIAGNOSIS — I4891 Unspecified atrial fibrillation: Secondary | ICD-10-CM | POA: Diagnosis not present

## 2023-01-15 LAB — CBC WITH DIFFERENTIAL/PLATELET
Abs Immature Granulocytes: 0.03 10*3/uL (ref 0.00–0.07)
Basophils Absolute: 0 10*3/uL (ref 0.0–0.1)
Basophils Relative: 1 %
Eosinophils Absolute: 0.1 10*3/uL (ref 0.0–0.5)
Eosinophils Relative: 3 %
HCT: 30 % — ABNORMAL LOW (ref 36.0–46.0)
Hemoglobin: 9.8 g/dL — ABNORMAL LOW (ref 12.0–15.0)
Immature Granulocytes: 1 %
Lymphocytes Relative: 20 %
Lymphs Abs: 0.8 10*3/uL (ref 0.7–4.0)
MCH: 29.6 pg (ref 26.0–34.0)
MCHC: 32.7 g/dL (ref 30.0–36.0)
MCV: 90.6 fL (ref 80.0–100.0)
Monocytes Absolute: 0.5 10*3/uL (ref 0.1–1.0)
Monocytes Relative: 13 %
Neutro Abs: 2.5 10*3/uL (ref 1.7–7.7)
Neutrophils Relative %: 62 %
Platelets: 234 10*3/uL (ref 150–400)
RBC: 3.31 MIL/uL — ABNORMAL LOW (ref 3.87–5.11)
RDW: 14.4 % (ref 11.5–15.5)
WBC: 4.1 10*3/uL (ref 4.0–10.5)
nRBC: 0 % (ref 0.0–0.2)

## 2023-01-15 LAB — COMPREHENSIVE METABOLIC PANEL
ALT: 22 U/L (ref 0–44)
AST: 24 U/L (ref 15–41)
Albumin: 2.3 g/dL — ABNORMAL LOW (ref 3.5–5.0)
Alkaline Phosphatase: 42 U/L (ref 38–126)
Anion gap: 3 — ABNORMAL LOW (ref 5–15)
BUN: 13 mg/dL (ref 8–23)
CO2: 18 mmol/L — ABNORMAL LOW (ref 22–32)
Calcium: 8.2 mg/dL — ABNORMAL LOW (ref 8.9–10.3)
Chloride: 119 mmol/L — ABNORMAL HIGH (ref 98–111)
Creatinine, Ser: 1.03 mg/dL — ABNORMAL HIGH (ref 0.44–1.00)
GFR, Estimated: 54 mL/min — ABNORMAL LOW (ref >60)
Glucose, Bld: 93 mg/dL (ref 70–99)
Potassium: 3.7 mmol/L (ref 3.5–5.1)
Sodium: 140 mmol/L (ref 135–145)
Total Bilirubin: 0.5 mg/dL (ref <1.2)
Total Protein: 4.4 g/dL — ABNORMAL LOW (ref 6.5–8.1)

## 2023-01-15 LAB — GLUCOSE, CAPILLARY
Glucose-Capillary: 128 mg/dL — ABNORMAL HIGH (ref 70–99)
Glucose-Capillary: 136 mg/dL — ABNORMAL HIGH (ref 70–99)
Glucose-Capillary: 248 mg/dL — ABNORMAL HIGH (ref 70–99)
Glucose-Capillary: 66 mg/dL — ABNORMAL LOW (ref 70–99)
Glucose-Capillary: 69 mg/dL — ABNORMAL LOW (ref 70–99)
Glucose-Capillary: 70 mg/dL (ref 70–99)
Glucose-Capillary: 88 mg/dL (ref 70–99)
Glucose-Capillary: 97 mg/dL (ref 70–99)

## 2023-01-15 LAB — PHOSPHORUS: Phosphorus: 2.4 mg/dL — ABNORMAL LOW (ref 2.5–4.6)

## 2023-01-15 LAB — MAGNESIUM: Magnesium: 1.8 mg/dL (ref 1.7–2.4)

## 2023-01-15 MED ORDER — CHOLESTYRAMINE 4 G PO PACK
4.0000 g | PACK | Freq: Two times a day (BID) | ORAL | Status: DC
  Administered 2023-01-16 – 2023-01-17 (×3): 4 g via ORAL
  Filled 2023-01-15 (×5): qty 1

## 2023-01-15 NOTE — Progress Notes (Addendum)
Attending physician's note   I have taken a history, reviewed the chart, and examined the patient. I performed a substantive portion of this encounter, including complete performance of at least one of the key components, in conjunction with the APP. I agree with the APP's note, impression, and recommendations with my edits.   Seems to have had a good response to just a single dose of cholestyramine so far with no BM overnight and just 1 this morning.  Path pending from colonoscopy on 12/13.  Hopefully this is back tomorrow to evaluate for possible microscopic colitis.  In the meantime, we will continue treating with cholestyramine and can uptitrate if needed, along with continued Lomotil.  GI service will continue to follow.  Dr. Rhea Belton will assume her ongoing inpatient GI care starting tomorrow morning.  Ulyses Panico, DO, FACG [phone] office          Progress Note   Subjective  Chief Complaint: Diarrhea  This morning patient tells [place] that she took a dose of cholestyramine yesterday evening and then again this morning, she actually did not have any bowel movements throughout the night which is an improvement for her.  She did have 1 liquidy bowel movement after eating breakfast this morning.  She is not exactly encouraged today by her improvement, but our service is.    Objective   Vital signs in last 24 hours: Temp:  [98.1 F (36.7 C)-98.4 F (36.9 C)] 98.1 F (36.7 C) (12/15 0938) Pulse Rate:  [67-76] 71 (12/15 0938) Resp:  [16-18] 16 (12/15 0938) BP: (128-132)/(61-65) 132/65 (12/15 0938) SpO2:  [97 %-100 %] 100 % (12/15 0938) Weight:  [61 kg] 61 kg (12/15 0500) Last BM Date : 01/14/23 General:   Elderly white female in NAD Heart:  Regular rate and rhythm; no murmurs Lungs: Respirations even and unlabored, lungs CTA bilaterally Abdomen:  Soft, nontender and nondistended.  Increased bowel sounds all 4 quadrants Psych:  Cooperative. Normal mood and  affect.  Intake/Output from previous day: 12/14 0701 - 12/15 0700 In: 50 [P.O.:50] Out: -    Lab Results: Recent Labs    01/13/23 0548 01/14/23 0406 01/15/23 0624  WBC 3.9* 6.5 4.1  HGB 10.4* 10.6* 9.8*  HCT 31.3* 32.2* 30.0*  PLT 250 252 234   BMET Recent Labs    01/13/23 0548 01/14/23 0406 01/15/23 0624  NA 140 138 140  K 3.3* 3.4* 3.7  CL 118* 115* 119*  CO2 18* 17* 18*  GLUCOSE 194* 74 93  BUN 10 14 13   CREATININE 0.79 0.77 1.03*  CALCIUM 8.3* 7.9* 8.2*   LFT Recent Labs    01/15/23 0624  PROT 4.4*  ALBUMIN 2.3*  AST 24  ALT 22  ALKPHOS 42  BILITOT 0.5     Assessment / Plan:   Assessment: 1.  Diarrhea: Acute onset of diarrhea around Thanksgiving, 12/22/2022, CT with mesenteric stranding and nodules progressive on CT 12/7, C. difficile and GI path panel negative, status post cholecystectomy years ago, Imodium initially helpful in the hospital but then continued watery stools, status post colonoscopy 01/13/2023 with biopsies pending for microscopic colitis, has had slowing of bowel movements overnight with Lomotil twice daily and Cholestyramine daily 2.  Abnormal CT of the abdomen: 3.  Hypokalemia 4.  A-fib on Xarelto  Plan: 1.  Pathology results still pending from colonoscopy which was completed on Friday, 01/13/2023 2.  Lomotil was started on 01/13/23 twice daily with some slowing of stool, Cholestyramine added  yesterday 4 g and has had improvement overnight in bowels 3.  Continue Lomotil twice daily and Cholestyramine daily  Thank you for your kind consultation, we will continue to follow.    LOS: 3 days   Unk Lightning  01/15/2023, 10:28 AM

## 2023-01-15 NOTE — Progress Notes (Signed)
PROGRESS NOTE    Donna Wiley  BJY:[id number] DOB: September 16, 1940 DOA: 01/09/2023 PCP: Raquel James, MD  Chief Complaint  Patient presents with   Diarrhea    Hospital Course:  Donna Wiley is 82 y.o. female with paroxysmal atrial fibrillation on Xarelto, type 2 diabetes, GERD, hypertension, deficiency anemia, aortic stenosis, who presents to the emergency department for evaluation of diarrhea and abdominal pain.  Patient has had diarrhea for the last 12 days.  She reports that is nonbloody but watery.  No nausea no vomiting.  She has had anorexia due to fear of going to the bathroom.  She denies any recent antibiotics or history of C. difficile.  Denies any sick contacts.  Patient initially presented to outside hospital 2 weeks ago after 3 days of acute watery diarrhea.  She reports workup was negative at that time.  Thus far her GI PCR and C. difficile have been negative. Colonoscopy 12/13 unremarkable. Follow biopsies.  Subjective: Patient had no bowel movements overnight.  Initially had success with cholestyramine.  After breakfast today she had 3 bowel movements with urgency.  Continues to deny abdominal pain at rest.  Did have an additional hypoglycemic episode this a.m.  Objective: Vitals:   01/15/23 0500 01/15/23 0621 01/15/23 0938 01/15/23 1504  BP:  130/65 132/65 (!) 113/55  Pulse:  67 71 67  Resp:  18 16 18   Temp:  98.1 F (36.7 C) 98.1 F (36.7 C) 98 F (36.7 C)  TempSrc:  Oral Oral Oral  SpO2:  100% 100% 99%  Weight: 61 kg     Height:        Intake/Output Summary (Last 24 hours) at 01/15/2023 1518 Last data filed at 01/15/2023 0500 Gross per 24 hour  Intake 50 ml  Output --  Net 50 ml    Filed Weights   01/13/23 0419 01/14/23 0500 01/15/23 0500  Weight: 59.3 kg 61.9 kg 61 kg    Examination: General exam: Appears calm and comfortable, NAD  Respiratory system: No work of breathing, symmetric chest wall expansion Cardiovascular system: S1 & S2  heard, RRR.  Gastrointestinal system: Abdomen is nondistended, soft and not tender to palpation.  No guarding Neuro: Alert and oriented. No focal neurological deficits. Extremities: Symmetric, expected ROM Skin: No rashes, lesions Psychiatry: Demonstrates appropriate judgement and insight. Mood & affect appropriate for situation.   Assessment & Plan:  Principal Problem:   Diarrhea Active Problems:   Gastroenteritis   Mesenteric lymphadenopathy   Stage 1 acute kidney injury (HCC)   Acute diarrhea   Change in bowel habits  Diarrhea -Etiology remains unclear - CT abdomen pelvis 12/6: Moderate mesenteric stranding and nodules progressively chronic since at least 2017.  No other findings of colitis. - GI PCR negative - C. difficile negative - TSH, HIV, lipase all negative - O&P pending - Status post fluid resuscitation - Fecal calprotectin elevated though this would be unusual age for new IBD - Patient reports last colonoscopy was 2023 which was normal - GI consulted -- Colonoscopy 12/13 grossly unremarkable biopsies taken, will follow .-Failed trial of Imodium - Now on scheduled Lomotil, and cholestyramine.  Patient had initial success with cholestyramine, will increase to twice daily dosing today and monitor response.  Can continue to uptitrate if needed  Chronic mesenteric stranding and nodules - Seen on outside CT scan in 2017, redemonstrated on outside CT 12/6: Moderate central mesenteric stranding with some small nodules.  Progressing from older exams.  No splenomegaly or pathological nodal enlargement.  Recommending follow-up CT in 3 to 6 months.  Discussed this with patient directly. - Follow-up CT outpt   AKI - Appears baseline creatinine is 0.9, was 1.29 on admission - Suspect this is prerenal and in setting of diarrheal illness. - Has resolved now.  Will continue to follow with daily CMP  Leukopenia - WBC 4.6 on arrival -> 3.0 -> 3.9 -> 6.5 - Suspect secondary to  infection as above, she remains afebrile - HIV negative - Peripheral smear reveals atypical lymphocytes, teardrop cells, burr cells, giant platelets:   Discussed these findings with hematology Dr. Cherly Hensen, reports this is all likely secondary to infection, repeat peripheral smear when infection is resolved with PCP in 3 weeks  Generalized weakness - Due to diarrhea and electrolyte deficiencies as above - PT/OT  Hypokalemia Hypomagnesemia - Replace as needed - Due to diarrhea - Trend CMP  Type 1 diabetes, long-term use of insulin Hypoglycemia - On insulin pump, will continue with patient management of insulin pump - Continue CBG - Have instructed patient to discontinue all insulin and tolerate slightly higher blood glucose up to 180 given her recurrent hypoglycemia which is secondary to her ongoing diarrhea.  Atrial fibrillation on anticoagulation - Continue home dose flecainide, diltiazem -- Resume Xarelto today  Aortic stenosis - Continue outpatient monitoring  Hypertension - Continue home meds for now - Hold ARB/HCTZ due to AKI and volume depletion  Hyperlipidemia - Continue statin  GERD Duodenitis - Continue PPI  History of IJ thrombus Cardiovascular disease - Home meds as above  Iron deficiency anemia - Hgb stable for now - Ferrous sulfate       DVT prophylaxis: Resume Xarelto now   Code Status: Full Code Family Communication: None at bedside. Discussed directly with pt Disposition:  Status is: inpt for intractable diarrhea and ongoing work up    Consultants:  GI  Treatment Team:  Consulting Physician: Shellia Cleverly, DO  Procedures:  N/a  Antimicrobials:  Anti-infectives (From admission, onward)    None       Data Reviewed: I have personally reviewed following labs and imaging studies CBC: Recent Labs  Lab 01/11/23 1541 01/12/23 0532 01/13/23 0548 01/14/23 0406 01/15/23 0624  WBC 4.4 4.8 3.9* 6.5 4.1  NEUTROABS 2.9 2.8 2.3 4.7  2.5  HGB 10.9* 10.5* 10.4* 10.6* 9.8*  HCT 32.6* 31.7* 31.3* 32.2* 30.0*  MCV 90.3 90.8 90.5 89.4 90.6  PLT 267 267 250 252 234   Basic Metabolic Panel: Recent Labs  Lab 01/11/23 0547 01/12/23 0532 01/13/23 0548 01/14/23 0406 01/15/23 0624  NA 139 138 140 138 140  K 3.7 3.7 3.3* 3.4* 3.7  CL 120* 115* 118* 115* 119*  CO2 17* 19* 18* 17* 18*  GLUCOSE 152* 163* 194* 74 93  BUN 18 12 10 14 13   CREATININE 0.90 0.77 0.79 0.77 1.03*  CALCIUM 7.7* 8.2* 8.3* 7.9* 8.2*  MG 1.8 1.8 1.6* 2.4 1.8  PHOS 2.0* 1.8* 2.7 2.1* 2.4*   GFR: Estimated Creatinine Clearance: 35.6 mL/min (A) (by C-G formula based on SCr of 1.03 mg/dL (H)). Liver Function Tests: Recent Labs  Lab 01/11/23 0547 01/12/23 0532 01/13/23 0548 01/14/23 0406 01/15/23 0624  AST 22 25 22 24 24   ALT 27 27 25 24 22   ALKPHOS 35* 39 39 39 42  BILITOT 0.4 0.5 0.4 0.5 0.5  PROT 4.3* 4.8* 4.6* 4.8* 4.4*  ALBUMIN 2.3* 2.6* 2.5* 2.6* 2.3*   CBG: Recent Labs  Lab 01/14/23 2152 01/15/23 0035 01/15/23 0731  01/15/23 0829 01/15/23 1147  GLUCAP 189* 128* 88 97 136*    Recent Results (from the past 240 hours)  Gastrointestinal Panel by PCR , Stool     Status: None   Collection Time: 01/09/23  9:28 PM   Specimen: Urine, Clean Catch; Stool  Result Value Ref Range Status   Campylobacter species NOT DETECTED NOT DETECTED Final   Plesimonas shigelloides NOT DETECTED NOT DETECTED Final   Salmonella species NOT DETECTED NOT DETECTED Final   Yersinia enterocolitica NOT DETECTED NOT DETECTED Final   Vibrio species NOT DETECTED NOT DETECTED Final   Vibrio cholerae NOT DETECTED NOT DETECTED Final   Enteroaggregative E coli (EAEC) NOT DETECTED NOT DETECTED Final   Enteropathogenic E coli (EPEC) NOT DETECTED NOT DETECTED Final   Enterotoxigenic E coli (ETEC) NOT DETECTED NOT DETECTED Final   Shiga like toxin producing E coli (STEC) NOT DETECTED NOT DETECTED Final   Shigella/Enteroinvasive E coli (EIEC) NOT DETECTED NOT DETECTED  Final   Cryptosporidium NOT DETECTED NOT DETECTED Final   Cyclospora cayetanensis NOT DETECTED NOT DETECTED Final   Entamoeba histolytica NOT DETECTED NOT DETECTED Final   Giardia lamblia NOT DETECTED NOT DETECTED Final   Adenovirus F40/41 NOT DETECTED NOT DETECTED Final   Astrovirus NOT DETECTED NOT DETECTED Final   Norovirus GI/GII NOT DETECTED NOT DETECTED Final   Rotavirus A NOT DETECTED NOT DETECTED Final   Sapovirus (I, II, IV, and V) NOT DETECTED NOT DETECTED Final    Comment: Performed at [facility], [address] [place], [place] [address]  C Difficile Quick Screen w PCR reflex     Status: None   Collection Time: 01/09/23  9:28 PM  Result Value Ref Range Status   C Diff antigen NEGATIVE NEGATIVE Final   C Diff toxin NEGATIVE NEGATIVE Final   C Diff interpretation No C. difficile detected.  Final    Comment: Performed at [facility], [address], [place], [place] [address]  Calprotectin, Fecal     Status: Abnormal   Collection Time: 01/10/23  2:04 AM   Specimen: Stool  Result Value Ref Range Status   Calprotectin, Fecal 297 (H) 0 - 120 ug/g Final    Comment: (NOTE) Concentration     Interpretation   Follow-Up < 5 - 50 ug/g     Normal           None >50 -120 ug/g     Borderline       Re-evaluate in 4-6 weeks    >120 ug/g     Abnormal         Repeat as clinically                                   indicated Performed At: [facility] [address] [place], [place] [id number] Jolene Schimke MD MW:[id number]      Radiology Studies: No results found.  Scheduled Meds:  cholestyramine  4 g Oral BID   diltiazem  180 mg Oral Daily   diphenoxylate-atropine  1 tablet Oral BID   flecainide  100 mg Oral QHS   flecainide  50 mg Oral Daily   hydrocortisone   Rectal BID   insulin pump   Subcutaneous TID WC, HS, 0200   rivaroxaban  20 mg Oral Q supper   simvastatin  20 mg Oral QHS   sodium chloride flush  3 mL Intravenous Q12H  Continuous  Infusions:   LOS: 3 days    Time spent:  Debarah Crape, DO [facility]  To contact the attending physician between 7A-7P please use Epic Chat. To contact the covering physician during after hours 7P-7A, please review Amion.   01/15/2023, 3:18 PM   *This document has been created with the assistance of dictation software. Please excuse typographical errors. *

## 2023-01-15 NOTE — Plan of Care (Incomplete)

## 2023-01-16 DIAGNOSIS — K529 Noninfective gastroenteritis and colitis, unspecified: Secondary | ICD-10-CM | POA: Diagnosis not present

## 2023-01-16 DIAGNOSIS — N179 Acute kidney failure, unspecified: Secondary | ICD-10-CM

## 2023-01-16 DIAGNOSIS — Z7901 Long term (current) use of anticoagulants: Secondary | ICD-10-CM | POA: Diagnosis not present

## 2023-01-16 DIAGNOSIS — R197 Diarrhea, unspecified: Secondary | ICD-10-CM | POA: Diagnosis not present

## 2023-01-16 DIAGNOSIS — R194 Change in bowel habit: Secondary | ICD-10-CM

## 2023-01-16 DIAGNOSIS — I4891 Unspecified atrial fibrillation: Secondary | ICD-10-CM | POA: Diagnosis not present

## 2023-01-16 DIAGNOSIS — R591 Generalized enlarged lymph nodes: Secondary | ICD-10-CM

## 2023-01-16 LAB — CBC WITH DIFFERENTIAL/PLATELET
Abs Immature Granulocytes: 0.04 10*3/uL (ref 0.00–0.07)
Basophils Absolute: 0.1 10*3/uL (ref 0.0–0.1)
Basophils Relative: 1 %
Eosinophils Absolute: 0.1 10*3/uL (ref 0.0–0.5)
Eosinophils Relative: 3 %
HCT: 30 % — ABNORMAL LOW (ref 36.0–46.0)
Hemoglobin: 10 g/dL — ABNORMAL LOW (ref 12.0–15.0)
Immature Granulocytes: 1 %
Lymphocytes Relative: 24 %
Lymphs Abs: 1.1 10*3/uL (ref 0.7–4.0)
MCH: 30.1 pg (ref 26.0–34.0)
MCHC: 33.3 g/dL (ref 30.0–36.0)
MCV: 90.4 fL (ref 80.0–100.0)
Monocytes Absolute: 0.6 10*3/uL (ref 0.1–1.0)
Monocytes Relative: 14 %
Neutro Abs: 2.5 10*3/uL (ref 1.7–7.7)
Neutrophils Relative %: 57 %
Platelets: 245 10*3/uL (ref 150–400)
RBC: 3.32 MIL/uL — ABNORMAL LOW (ref 3.87–5.11)
RDW: 14.4 % (ref 11.5–15.5)
WBC: 4.4 10*3/uL (ref 4.0–10.5)
nRBC: 0 % (ref 0.0–0.2)

## 2023-01-16 LAB — URINALYSIS, ROUTINE W REFLEX MICROSCOPIC
Bilirubin Urine: NEGATIVE
Glucose, UA: 150 mg/dL — AB
Hgb urine dipstick: NEGATIVE
Ketones, ur: 20 mg/dL — AB
Leukocytes,Ua: NEGATIVE
Nitrite: NEGATIVE
Protein, ur: NEGATIVE mg/dL
Specific Gravity, Urine: 1.02 (ref 1.005–1.030)
pH: 5 (ref 5.0–8.0)

## 2023-01-16 LAB — COMPREHENSIVE METABOLIC PANEL
ALT: 23 U/L (ref 0–44)
AST: 25 U/L (ref 15–41)
Albumin: 2.4 g/dL — ABNORMAL LOW (ref 3.5–5.0)
Alkaline Phosphatase: 41 U/L (ref 38–126)
Anion gap: 4 — ABNORMAL LOW (ref 5–15)
BUN: 14 mg/dL (ref 8–23)
CO2: 19 mmol/L — ABNORMAL LOW (ref 22–32)
Calcium: 8.4 mg/dL — ABNORMAL LOW (ref 8.9–10.3)
Chloride: 115 mmol/L — ABNORMAL HIGH (ref 98–111)
Creatinine, Ser: 0.97 mg/dL (ref 0.44–1.00)
GFR, Estimated: 58 mL/min — ABNORMAL LOW (ref >60)
Glucose, Bld: 124 mg/dL — ABNORMAL HIGH (ref 70–99)
Potassium: 3.7 mmol/L (ref 3.5–5.1)
Sodium: 138 mmol/L (ref 135–145)
Total Bilirubin: 0.4 mg/dL (ref <1.2)
Total Protein: 4.5 g/dL — ABNORMAL LOW (ref 6.5–8.1)

## 2023-01-16 LAB — GLUCOSE, CAPILLARY
Glucose-Capillary: 134 mg/dL — ABNORMAL HIGH (ref 70–99)
Glucose-Capillary: 88 mg/dL (ref 70–99)
Glucose-Capillary: 210 mg/dL — ABNORMAL HIGH (ref 70–99)
Glucose-Capillary: 201 mg/dL — ABNORMAL HIGH (ref 70–99)
Glucose-Capillary: 143 mg/dL — ABNORMAL HIGH (ref 70–99)

## 2023-01-16 LAB — O&P RESULT

## 2023-01-16 LAB — PHOSPHORUS: Phosphorus: 2.7 mg/dL (ref 2.5–4.6)

## 2023-01-16 LAB — OVA + PARASITE EXAM

## 2023-01-16 LAB — SURGICAL PATHOLOGY

## 2023-01-16 LAB — MAGNESIUM: Magnesium: 1.7 mg/dL (ref 1.7–2.4)

## 2023-01-16 NOTE — Inpatient Diabetes Management (Signed)
Inpatient Diabetes Program Recommendations  AACE/ADA: New Consensus Statement on Inpatient Glycemic Control (2015)  Target Ranges:  Prepandial:   less than 140 mg/dL      Peak postprandial:   less than 180 mg/dL (1-2 hours)      Critically ill patients:  140 - 180 mg/dL   Lab Results  Component Value Date   GLUCAP 210 (H) 01/16/2023    Review of Glycemic Control  Latest Reference Range & Units 01/15/23 16:42 01/15/23 17:06 01/15/23 21:26 01/16/23 02:08 01/16/23 08:58 01/16/23 12:04  Glucose-Capillary 70 - 99 mg/dL 69 (L) 70 063 (H) 016 (H) 88 210 (H)   Diabetes history: DM 1 Outpatient Diabetes medications:  MN .35 MN 10 MN 45-->40 MN 140  0300 .4 0600 3.3 0600 40-->35 120  0700 .55 1100 3.7 140  1400 .3 1700 8.5  1800 .5   Total Basal insulin = 10.7 units/day.  Auto basal insulin = N/a Active insulin time = 4    Current orders for Inpatient glycemic control:  Insulin pump  Inpatient Diabetes Program Recommendations:    Spoke with patient briefly and she states that she is not covering her blood sugars as aggressively due to lows.  She states that she changed Omnipod yesterday.  She is hoping to d/c home tomorrow.  Briefly discussed her following up with DrRoanna Raider and inquiring about Auto mode (since she wears sensor)- to help prevent lows. Patient very savvy and appreciative of visit.  Thanks,  Beryl Meager, RN, BC-ADM Inpatient Diabetes Coordinator Pager [phone]  (8a-5p)

## 2023-01-16 NOTE — Progress Notes (Signed)
Daily Progress Note  DOA: 01/09/2023 Hospital Day: 8  Primary GI: Raquel James MD Chief Complaint: Diarrhea  ASSESSMENT    Brief Narrative:  Donna Wiley is a 82 y.o. year old female with a history of  diabetes on insulin pump, A-fib on anticoagulation, aortic stenosis, GERD, duodenitis, IDA   Acute diarrhea. Stool studies negative for infection. Normal colonoscopy with normal random biopsies  Diarrhea may still have been 2/2 to infectious process   A-FIB, on Xarelto  Principal Problem:   Diarrhea Active Problems:   Gastroenteritis   Mesenteric lymphadenopathy   Stage 1 acute kidney injury (HCC)   Acute diarrhea   Change in bowel habits   PLAN   --Continue BID lomotil and BID cholestyramine for now. However, she should discontinue lomotil if goes more than 24 hours without a BM. Then is still not having BM, or stools hard then should stop cholestyramine as well. Would probably be easy to get constipated since she doesn't typically have diarrhea.  --GI will sign off. Patient can  follow up  with her primary GI who is with [facility]    Subjective   Watery stool after dinner last night. First BM this am was partly formed but then had another liquid BM.    Objective   Colonoscopy 01/13/23 Examined portion of terminal ileum was normal Entire colon normal. Random biopsies taken   A. COLON, RANDOM, BIOPSY:  Benign colonic mucosa with no diagnostic abnormality    Recent Labs    01/14/23 0406 01/15/23 0624 01/16/23 0448  WBC 6.5 4.1 4.4  HGB 10.6* 9.8* 10.0*  HCT 32.2* 30.0* 30.0*  PLT 252 234 245   BMET Recent Labs    01/14/23 0406 01/15/23 0624 01/16/23 0448  NA 138 140 138  K 3.4* 3.7 3.7  CL 115* 119* 115*  CO2 17* 18* 19*  GLUCOSE 74 93 124*  BUN 14 13 14   CREATININE 0.77 1.03* 0.97  CALCIUM 7.9* 8.2* 8.4*   LFT Recent Labs    01/16/23 0448  PROT 4.5*  ALBUMIN 2.4*  AST 25  ALT 23  ALKPHOS 41  BILITOT 0.4   PT/INR No results  for input(s): "LABPROT", "INR" in the last 72 hours.   Imaging:  MM 3D SCREENING MAMMOGRAM BILATERAL BREAST CLINICAL DATA:  Screening.  EXAM: DIGITAL SCREENING BILATERAL MAMMOGRAM WITH TOMOSYNTHESIS AND CAD  TECHNIQUE: Bilateral screening digital craniocaudal and mediolateral oblique mammograms were obtained. Bilateral screening digital breast tomosynthesis was performed. The images were evaluated with computer-aided detection.  COMPARISON:  Previous exam(s).  ACR Breast Density Category b: There are scattered areas of fibroglandular density.  FINDINGS: There are no findings suspicious for malignancy.  IMPRESSION: No mammographic evidence of malignancy. A result letter of this screening mammogram will be mailed directly to the patient.  RECOMMENDATION: Screening mammogram in one year. (Code:[id number])  BI-RADS CATEGORY  1: Negative.  Electronically Signed   By: Edwin Cap M.D.   On: 11/09/2022 07:47     Scheduled inpatient medications:   cholestyramine  4 g Oral BID   diltiazem  180 mg Oral Daily   diphenoxylate-atropine  1 tablet Oral BID   flecainide  100 mg Oral QHS   flecainide  50 mg Oral Daily   hydrocortisone   Rectal BID   insulin pump   Subcutaneous TID WC, HS, 0200   rivaroxaban  20 mg Oral Q supper   simvastatin  20 mg Oral QHS   sodium chloride flush  3  mL Intravenous Q12H   Continuous inpatient infusions:  PRN inpatient medications: acetaminophen  Vital signs in last 24 hours: Temp:  [97.8 F (36.6 C)-98.1 F (36.7 C)] 98.1 F (36.7 C) (12/16 0900) Pulse Rate:  [67-77] 77 (12/16 0900) Resp:  [16-20] 18 (12/16 0900) BP: (113-135)/(54-64) 135/61 (12/16 1012) SpO2:  [98 %-99 %] 99 % (12/16 0900) Weight:  [60.9 kg] 60.9 kg (12/16 0500) Last BM Date : 01/15/23 No intake or output data in the 24 hours ending 01/16/23 1224  Intake/Output from previous day: No intake/output data recorded. Intake/Output this shift: No intake/output data  recorded.   Physical Exam:  General: Alert female in NAD Heart:  Regular rate and rhythm.  Pulmonary: Normal respiratory effort Abdomen: Soft, nondistended, nontender. Normal bowel sounds. Extremities: No lower extremity edema  Neurologic: Alert and oriented Psych: Pleasant. Cooperative. Insight appears normal.      LOS: 4 days   Willette Cluster ,NP 01/16/2023, 12:24 PM

## 2023-01-16 NOTE — Progress Notes (Signed)
PROGRESS NOTE    Donna Wiley  WJX:[id number] DOB: [birth year] DOA: 01/09/2023 PCP: Raquel James, MD  Chief Complaint  Patient presents with   Diarrhea    Hospital Course:  Donna Wiley is 82 y.o. female with paroxysmal atrial fibrillation on Xarelto, type 2 diabetes, GERD, hypertension, deficiency anemia, aortic stenosis, who presents to the emergency department for evaluation of diarrhea and abdominal pain.  Patient has had diarrhea for the last 12 days.  She reports that is nonbloody but watery.  No nausea no vomiting.  She has had anorexia due to fear of going to the bathroom.  She denies any recent antibiotics or history of C. difficile.  Denies any sick contacts.  Patient initially presented to outside hospital 2 weeks ago after 3 days of acute watery diarrhea.  She reports workup was negative at that time.  Thus far her GI PCR and C. difficile have been negative. Colonoscopy 12/13 unremarkable. Follow biopsies.  Subjective: Still had multiple bowel movements overnight.  She does report only 2 bowel movement so far today.  She did have 1 formed stool which is significant improvement from her last 2 weeks.  She is also endorsing some left-sided flank pain today.  Objective: Vitals:   01/16/23 0607 01/16/23 0900 01/16/23 1012 01/16/23 1604  BP: 131/64 135/61 135/61 (!) 122/57  Pulse: 69 77  74  Resp: 16 18  17   Temp: 98.1 F (36.7 C) 98.1 F (36.7 C)  98 F (36.7 C)  TempSrc: Oral     SpO2: 98% 99%  100%  Weight:      Height:        Intake/Output Summary (Last 24 hours) at 01/16/2023 1732 Last data filed at 01/16/2023 1300 Gross per 24 hour  Intake 600 ml  Output --  Net 600 ml    Filed Weights   01/14/23 0500 01/15/23 0500 01/16/23 0500  Weight: 61.9 kg 61 kg 60.9 kg    Examination: General exam: Appears calm and comfortable, NAD  Respiratory system: No work of breathing, symmetric chest wall expansion Cardiovascular system: S1 & S2 heard, RRR.   Gastrointestinal system: Abdomen is nondistended, soft and not tender to palpation.  No guarding. Mild Left flank tenderness Neuro: Alert and oriented. No focal neurological deficits. Extremities: Symmetric, expected ROM Skin: No rashes, lesions Psychiatry: Demonstrates appropriate judgement and insight. Mood & affect appropriate for situation.   Assessment & Plan:  Principal Problem:   Diarrhea Active Problems:   Gastroenteritis   Mesenteric lymphadenopathy   Stage 1 acute kidney injury (HCC)   Acute diarrhea   Change in bowel habits  Diarrhea -Etiology remains unclear - CT abdomen pelvis 12/6: Moderate mesenteric stranding and nodules progressively chronic since at least 2017.  No other findings of colitis. - GI PCR negative - C. difficile negative - TSH, HIV, lipase all negative - O&P pending - Status post fluid resuscitation - Fecal calprotectin elevated though this would be unusual age for new IBD - Patient reports last colonoscopy was 2023 which was normal - GI consulted -- Colonoscopy 12/13 grossly unremarkable. Biopsies negative. .-Failed trial of Imodium - Now on scheduled Lomotil, and cholestyramine. Cont to monitor for improvement  Chronic mesenteric stranding and nodules - Seen on outside CT scan in 2017, redemonstrated on outside CT 12/6: Moderate central mesenteric stranding with some small nodules.  Progressing from older exams.  No splenomegaly or pathological nodal enlargement.  Recommending follow-up CT in 3 to 6 months.  Discussed this with patient directly. -  Follow-up CT outpt   AKI - Appears baseline creatinine is 0.9, was 1.29 on admission - Suspect this is prerenal and in setting of diarrheal illness. - Has resolved now.  Will continue to follow with daily CMP  Leukopenia - Resolved now - Suspect secondary to infection as above, she remains afebrile - HIV negative - Peripheral smear reveals atypical lymphocytes, teardrop cells, burr cells, giant  platelets:   Discussed these findings with hematology Dr. Cherly Hensen, reports this is all likely secondary to infection, repeat peripheral smear when infection is resolved with PCP in 3 weeks  Generalized weakness - Due to diarrhea and electrolyte deficiencies as above - PT/OT  Hypokalemia Hypomagnesemia - Replace as needed - Due to diarrhea - Trend CMP  Type 1 diabetes, long-term use of insulin Hypoglycemia - On insulin pump, will continue with patient management of insulin pump - Continue CBG - Have instructed patient to discontinue all insulin and tolerate slightly higher blood glucose up to 180 given her recurrent hypoglycemia which is secondary to her ongoing diarrhea.  Atrial fibrillation on anticoagulation - Continue home dose flecainide, diltiazem -- Resume Xarelto  Aortic stenosis - Continue outpatient monitoring  Hypertension - Hold ARB/HCTZ due to AKI and volume depletion. - BP has been stable without  Hyperlipidemia - Continue statin  GERD Duodenitis - Continue PPI  History of IJ thrombus Cardiovascular disease - Home meds as above  Iron deficiency anemia - Hgb stable for now - Ferrous sulfate       DVT prophylaxis: Xarelto   Code Status: Full Code Family Communication: None at bedside. Discussed directly with pt Disposition:  Status is: inpt for intractable diarrhea. Hopeful for DC tomorrow if resolved    Consultants:  GI    Procedures:  N/a  Antimicrobials:  Anti-infectives (From admission, onward)    None       Data Reviewed: I have personally reviewed following labs and imaging studies CBC: Recent Labs  Lab 01/12/23 0532 01/13/23 0548 01/14/23 0406 01/15/23 0624 01/16/23 0448  WBC 4.8 3.9* 6.5 4.1 4.4  NEUTROABS 2.8 2.3 4.7 2.5 2.5  HGB 10.5* 10.4* 10.6* 9.8* 10.0*  HCT 31.7* 31.3* 32.2* 30.0* 30.0*  MCV 90.8 90.5 89.4 90.6 90.4  PLT 267 250 252 234 245   Basic Metabolic Panel: Recent Labs  Lab 01/12/23 0532  01/13/23 0548 01/14/23 0406 01/15/23 0624 01/16/23 0448  NA 138 140 138 140 138  K 3.7 3.3* 3.4* 3.7 3.7  CL 115* 118* 115* 119* 115*  CO2 19* 18* 17* 18* 19*  GLUCOSE 163* 194* 74 93 124*  BUN 12 10 14 13 14   CREATININE 0.77 0.79 0.77 1.03* 0.97  CALCIUM 8.2* 8.3* 7.9* 8.2* 8.4*  MG 1.8 1.6* 2.4 1.8 1.7  PHOS 1.8* 2.7 2.1* 2.4* 2.7   GFR: Estimated Creatinine Clearance: 37.7 mL/min (by C-G formula based on SCr of 0.97 mg/dL). Liver Function Tests: Recent Labs  Lab 01/12/23 0532 01/13/23 0548 01/14/23 0406 01/15/23 0624 01/16/23 0448  AST 25 22 24 24 25   ALT 27 25 24 22 23   ALKPHOS 39 39 39 42 41  BILITOT 0.5 0.4 0.5 0.5 0.4  PROT 4.8* 4.6* 4.8* 4.4* 4.5*  ALBUMIN 2.6* 2.5* 2.6* 2.3* 2.4*   CBG: Recent Labs  Lab 01/15/23 2126 01/16/23 0208 01/16/23 0858 01/16/23 1204 01/16/23 1606  GLUCAP 248* 134* 88 210* 201*    Recent Results (from the past 240 hours)  Gastrointestinal Panel by PCR , Stool     Status: None  Collection Time: 01/09/23  9:28 PM   Specimen: Urine, Clean Catch; Stool  Result Value Ref Range Status   Campylobacter species NOT DETECTED NOT DETECTED Final   Plesimonas shigelloides NOT DETECTED NOT DETECTED Final   Salmonella species NOT DETECTED NOT DETECTED Final   Yersinia enterocolitica NOT DETECTED NOT DETECTED Final   Vibrio species NOT DETECTED NOT DETECTED Final   Vibrio cholerae NOT DETECTED NOT DETECTED Final   Enteroaggregative E coli (EAEC) NOT DETECTED NOT DETECTED Final   Enteropathogenic E coli (EPEC) NOT DETECTED NOT DETECTED Final   Enterotoxigenic E coli (ETEC) NOT DETECTED NOT DETECTED Final   Shiga like toxin producing E coli (STEC) NOT DETECTED NOT DETECTED Final   Shigella/Enteroinvasive E coli (EIEC) NOT DETECTED NOT DETECTED Final   Cryptosporidium NOT DETECTED NOT DETECTED Final   Cyclospora cayetanensis NOT DETECTED NOT DETECTED Final   Entamoeba histolytica NOT DETECTED NOT DETECTED Final   Giardia lamblia NOT  DETECTED NOT DETECTED Final   Adenovirus F40/41 NOT DETECTED NOT DETECTED Final   Astrovirus NOT DETECTED NOT DETECTED Final   Norovirus GI/GII NOT DETECTED NOT DETECTED Final   Rotavirus A NOT DETECTED NOT DETECTED Final   Sapovirus (I, II, IV, and V) NOT DETECTED NOT DETECTED Final    Comment: Performed at [facility], [address] [place], [place] [address]  C Difficile Quick Screen w PCR reflex     Status: None   Collection Time: 01/09/23  9:28 PM  Result Value Ref Range Status   C Diff antigen NEGATIVE NEGATIVE Final   C Diff toxin NEGATIVE NEGATIVE Final   C Diff interpretation No C. difficile detected.  Final    Comment: Performed at [facility], [address], [place], [place] [address]  Calprotectin, Fecal     Status: Abnormal   Collection Time: 01/10/23  2:04 AM   Specimen: Stool  Result Value Ref Range Status   Calprotectin, Fecal 297 (H) 0 - 120 ug/g Final    Comment: (NOTE) Concentration     Interpretation   Follow-Up < 5 - 50 ug/g     Normal           None >50 -120 ug/g     Borderline       Re-evaluate in 4-6 weeks    >120 ug/g     Abnormal         Repeat as clinically                                   indicated Performed At: [facility] [address] [place], [place] [id number] Jolene Schimke MD CZ:[id number]   OVA + PARASITE EXAM     Status: None   Collection Time: 01/11/23  2:49 PM   Specimen: Stool  Result Value Ref Range Status   OVA + PARASITE EXAM Final report  Final    Comment: (NOTE) These results were obtained using wet preparation(s) and trichrome stained smear. This test does not include testing for Cryptosporidium parvum, Cyclospora, or Microsporidia. Performed At: [facility] [address] [place], [place] [id number] Starling Manns MD [phone]    Source of Sample STOOL  Final    Comment: Performed at [facility], [address], [place], [place] [address]     Radiology  Studies: No results found.  Scheduled Meds:  cholestyramine  4 g Oral BID   diltiazem  180  mg Oral Daily   diphenoxylate-atropine  1 tablet Oral BID   flecainide  100 mg Oral QHS   flecainide  50 mg Oral Daily   hydrocortisone   Rectal BID   insulin pump   Subcutaneous TID WC, HS, 0200   rivaroxaban  20 mg Oral Q supper   simvastatin  20 mg Oral QHS   sodium chloride flush  3 mL Intravenous Q12H   Continuous Infusions:   LOS: 4 days    Time spent:  Debarah Crape, DO [facility]  To contact the attending physician between 7A-7P please use Epic Chat. To contact the covering physician during after hours 7P-7A, please review Amion.   01/16/2023, 5:32 PM   *This document has been created with the assistance of dictation software. Please excuse typographical errors. *

## 2023-01-16 NOTE — Plan of Care (Signed)

## 2023-01-16 NOTE — Progress Notes (Addendum)
   01/16/23 1125  Mobility  Activity Ambulated independently in hallway  Level of Assistance Independent (CG for LOBx1)  Assistive Device None  Distance Ambulated (ft) 800 ft  Activity Response Tolerated fair  Mobility Referral Yes  Mobility visit 1 Mobility  Mobility Specialist Start Time (ACUTE ONLY) 1104  Mobility Specialist Stop Time (ACUTE ONLY) 1125  Mobility Specialist Time Calculation (min) (ACUTE ONLY) 21 min   Mobility Specialist: Progress Note  Pt agreeable to mobility session - received in bed. Required Ind using no AD, CG for LOBx1 with self recovery. Pt was asymptomatic throughout session with no complaints. Returned to bed with all needs met - call bell within reach.   Barnie Mort, BS Mobility Specialist Please contact via SecureChat or Rehab office at [phone].

## 2023-01-17 ENCOUNTER — Encounter (HOSPITAL_COMMUNITY): Payer: Self-pay | Admitting: Gastroenterology

## 2023-01-17 DIAGNOSIS — A09 Infectious gastroenteritis and colitis, unspecified: Secondary | ICD-10-CM | POA: Diagnosis not present

## 2023-01-17 LAB — GLUCOSE, CAPILLARY
Glucose-Capillary: 113 mg/dL — ABNORMAL HIGH (ref 70–99)
Glucose-Capillary: 116 mg/dL — ABNORMAL HIGH (ref 70–99)
Glucose-Capillary: 123 mg/dL — ABNORMAL HIGH (ref 70–99)
Glucose-Capillary: 169 mg/dL — ABNORMAL HIGH (ref 70–99)

## 2023-01-17 MED ORDER — DIPHENOXYLATE-ATROPINE 2.5-0.025 MG PO TABS: 1.0000 | ORAL_TABLET | Freq: Two times a day (BID) | ORAL | 0 refills | Status: AC | PRN

## 2023-01-17 MED ORDER — CHOLESTYRAMINE 4 G PO PACK: 4.0000 g | PACK | Freq: Two times a day (BID) | ORAL | 0 refills | Status: DC

## 2023-01-17 NOTE — Discharge Summary (Signed)
Physician Discharge Summary   Patient: Donna Wiley MRN: [id number] DOB: 1940/05/10  Admit date:     01/09/2023  Discharge date: 01/17/23  Discharge Physician: Debarah Crape   PCP: Raquel James, MD   Recommendations at discharge:    Follow-up with PCP to repeat CBC in 1 month, and repeat CT abdomen pelvis in 3 months  Discharge Diagnoses: Principal Problem:   Diarrhea Active Problems:   Gastroenteritis   Mesenteric lymphadenopathy   Stage 1 acute kidney injury (HCC)   Acute diarrhea   Change in bowel habits  Resolved Problems:   * No resolved hospital problems. *  Hospital Course: PMP reviewed Consultants: GI. Hematology Procedures performed: Colonoscopy  Disposition: Home Diet recommendation:  Discharge Diet Orders (From admission, onward)     Start     Ordered   01/17/23 0000  Diet - low sodium heart healthy        01/17/23 1404           Regular diet DISCHARGE MEDICATION: Allergies as of 01/17/2023       Reactions   Iodine Anaphylaxis   Nickel Rash   Charentais Melon (french Melon)    Cherry Swelling   Codeine    Ivp Dye [iodinated Contrast Media]    Other    Tree nuts   Shellfish Allergy    Sulfa Antibiotics    Tape Dermatitis   Paper tape is best PAPER TAPE        Medication List     PAUSE taking these medications    olmesartan-hydrochlorothiazide 40-12.5 MG tablet Wait to take this until your doctor or other care provider tells you to start again. Commonly known as: BENICAR HCT Take 1 tablet by mouth daily.       STOP taking these medications    cetirizine 10 MG tablet Commonly known as: ZYRTEC   pantoprazole 40 MG tablet Commonly known as: PROTONIX   predniSONE 50 MG tablet Commonly known as: DELTASONE       TAKE these medications    b complex vitamins capsule Take 1 capsule by mouth daily.   calcium citrate 950 (200 Ca) MG tablet Commonly known as: CALCITRATE - dosed in mg elemental calcium Take 500 mg  of elemental calcium by mouth 2 (two) times daily.   cholestyramine 4 g packet Commonly known as: QUESTRAN Take 1 packet (4 g total) by mouth 2 (two) times daily for 14 days.   clobetasol cream 0.05 % Commonly known as: TEMOVATE Apply 1 Application topically 2 (two) times daily as needed (Dermatitis).   diltiazem 180 MG 24 hr capsule Commonly known as: CARDIZEM CD Take 180 mg by mouth at bedtime.   diphenoxylate-atropine 2.5-0.025 MG tablet Commonly known as: LOMOTIL Take 1 tablet by mouth 2 (two) times daily as needed for up to 14 days for diarrhea or loose stools.   EPINEPHrine 0.3 mg/0.3 mL Soaj injection Commonly known as: EPI-PEN Inject 0.3 mg into the muscle as needed for anaphylaxis.   flecainide 50 MG tablet Commonly known as: TAMBOCOR Take 50-100 mg by mouth See admin instructions. Take 50mg  (1 tablet) by mouth every morning and 100mg  (2 tablets) every evening.   insulin lispro 100 UNIT/ML injection Commonly known as: HUMALOG Inject 0-15 Units into the skin 4 (four) times daily.   LORazepam 0.5 MG tablet Commonly known as: ATIVAN Take 0.5 mg by mouth at bedtime.   Myrbetriq 50 MG Tb24 tablet Generic drug: mirabegron ER Take 50 mg by mouth daily.  rivaroxaban 20 MG Tabs tablet Commonly known as: XARELTO Take 20 mg by mouth every evening.   simvastatin 20 MG tablet Commonly known as: ZOCOR Take 10 mg by mouth at bedtime.        Discharge Exam: Filed Weights   01/15/23 0500 01/16/23 0500 01/17/23 0415  Weight: 61 kg 60.9 kg 59.3 kg   Constitutional:  Normal appearance. Non toxic-appearing.  HENT: Head Normocephalic and atraumatic.  Mucous membranes are moist.  Eyes:  Extraocular intact. Conjunctivae normal. Pupils are equal, round, and reactive to light.  Cardiovascular: Rate and Rhythm: Normal rate and regular rhythm.  Pulmonary: Non labored, symmetric rise of chest wall.  Musculoskeletal:  Normal range of motion.  Skin: warm and dry. not  jaundiced.  Neurological: No focal deficit present. alert. Oriented. Psychiatric: Mood and Affect congruent.    Condition at discharge: good  The results of significant diagnostics from this hospitalization (including imaging, microbiology, ancillary and laboratory) are listed below for reference.   Imaging Studies: No results found.  Microbiology: Results for orders placed or performed during the hospital encounter of 01/09/23  Gastrointestinal Panel by PCR , Stool     Status: None   Collection Time: 01/09/23  9:28 PM   Specimen: Urine, Clean Catch; Stool  Result Value Ref Range Status   Campylobacter species NOT DETECTED NOT DETECTED Final   Plesimonas shigelloides NOT DETECTED NOT DETECTED Final   Salmonella species NOT DETECTED NOT DETECTED Final   Yersinia enterocolitica NOT DETECTED NOT DETECTED Final   Vibrio species NOT DETECTED NOT DETECTED Final   Vibrio cholerae NOT DETECTED NOT DETECTED Final   Enteroaggregative E coli (EAEC) NOT DETECTED NOT DETECTED Final   Enteropathogenic E coli (EPEC) NOT DETECTED NOT DETECTED Final   Enterotoxigenic E coli (ETEC) NOT DETECTED NOT DETECTED Final   Shiga like toxin producing E coli (STEC) NOT DETECTED NOT DETECTED Final   Shigella/Enteroinvasive E coli (EIEC) NOT DETECTED NOT DETECTED Final   Cryptosporidium NOT DETECTED NOT DETECTED Final   Cyclospora cayetanensis NOT DETECTED NOT DETECTED Final   Entamoeba histolytica NOT DETECTED NOT DETECTED Final   Giardia lamblia NOT DETECTED NOT DETECTED Final   Adenovirus F40/41 NOT DETECTED NOT DETECTED Final   Astrovirus NOT DETECTED NOT DETECTED Final   Norovirus GI/GII NOT DETECTED NOT DETECTED Final   Rotavirus A NOT DETECTED NOT DETECTED Final   Sapovirus (I, II, IV, and V) NOT DETECTED NOT DETECTED Final    Comment: Performed at [facility], [address] [place], [place] [address]  C Difficile Quick Screen w PCR reflex     Status: None   Collection Time: 01/09/23   9:28 PM  Result Value Ref Range Status   C Diff antigen NEGATIVE NEGATIVE Final   C Diff toxin NEGATIVE NEGATIVE Final   C Diff interpretation No C. difficile detected.  Final    Comment: Performed at [facility], [address] [place], [place] [address]  Calprotectin, Fecal     Status: Abnormal   Collection Time: 01/10/23  2:04 AM   Specimen: Stool  Result Value Ref Range Status   Calprotectin, Fecal 297 (H) 0 - 120 ug/g Final    Comment: (NOTE) Concentration     Interpretation   Follow-Up < 5 - 50 ug/g     Normal           None >50 -120 ug/g     Borderline       Re-evaluate in 4-6 weeks    >  120 ug/g     Abnormal         Repeat as clinically                                   indicated Performed At: [facility] [address] [place], [place] [id number] Jolene Schimke MD OZ:[id number]   OVA + PARASITE EXAM     Status: None   Collection Time: 01/11/23  2:49 PM   Specimen: Stool  Result Value Ref Range Status   OVA + PARASITE EXAM Final report  Final    Comment: (NOTE) These results were obtained using wet preparation(s) and trichrome stained smear. This test does not include testing for Cryptosporidium parvum, Cyclospora, or Microsporidia. Performed At: [facility] [address] [place], [place] [id number] Starling Manns MD [phone]    Source of Sample STOOL  Final    Comment: Performed at [facility], [address], [place], [place] [address]    Labs: CBC: Recent Labs  Lab 01/12/23 0532 01/13/23 0548 01/14/23 0406 01/15/23 0624 01/16/23 0448  WBC 4.8 3.9* 6.5 4.1 4.4  NEUTROABS 2.8 2.3 4.7 2.5 2.5  HGB 10.5* 10.4* 10.6* 9.8* 10.0*  HCT 31.7* 31.3* 32.2* 30.0* 30.0*  MCV 90.8 90.5 89.4 90.6 90.4  PLT 267 250 252 234 245   Basic Metabolic Panel: Recent Labs  Lab 01/12/23 0532 01/13/23 0548 01/14/23 0406 01/15/23 0624 01/16/23 0448  NA 138 140 138 140 138  K 3.7 3.3* 3.4* 3.7 3.7  CL 115* 118* 115* 119*  115*  CO2 19* 18* 17* 18* 19*  GLUCOSE 163* 194* 74 93 124*  BUN 12 10 14 13 14   CREATININE 0.77 0.79 0.77 1.03* 0.97  CALCIUM 8.2* 8.3* 7.9* 8.2* 8.4*  MG 1.8 1.6* 2.4 1.8 1.7  PHOS 1.8* 2.7 2.1* 2.4* 2.7   Liver Function Tests: Recent Labs  Lab 01/12/23 0532 01/13/23 0548 01/14/23 0406 01/15/23 0624 01/16/23 0448  AST 25 22 24 24 25   ALT 27 25 24 22 23   ALKPHOS 39 39 39 42 41  BILITOT 0.5 0.4 0.5 0.5 0.4  PROT 4.8* 4.6* 4.8* 4.4* 4.5*  ALBUMIN 2.6* 2.5* 2.6* 2.3* 2.4*   CBG: Recent Labs  Lab 01/16/23 2102 01/17/23 0001 01/17/23 0207 01/17/23 0846 01/17/23 1241  GLUCAP 143* 116* 123* 169* 113*    Discharge time spent: greater than 30 minutes.  Signed: Debarah Crape, DO [facility] 01/17/2023

## 2023-01-17 NOTE — Hospital Course (Signed)
Ms. Holzer is an 82 year old female with paroxysmal atrial fibrillation on Xarelto, type 2 diabetes on insulin pump, GERD, hypertension, iron deficiency anemia, aortic stenosis, who presented to the emergency department for evaluation of diarrhea and abdominal pain.  Patient had ongoing frequent, nonbloody, watery, diarrhea for 12 days prior to arrival.  This is patient's second presentation to the hospital for this problem.  She initially presented to the hospital 3 days after the problem began and was discharged.  It did not resolve so she returned.  CT abdomen pelvis revealed moderate mesenteric stranding and nodules which were redemonstrated and seen in 2017.  No findings of acute colitis.  Patient had extensive workup which revealed the following: C. difficile, GI PCR, O&P, TSH, HIV, lipase: All negative. Fecal calprotectin was elevated.  GI was consulted.  She underwent colonoscopy on 12/13 which was grossly unremarkable.  Biopsies were taken and ultimately were negative.   Patient was started on a trial of Imodium which did not help.  She was also started on Lomotil with minimal success.  She was started on cholestyramine and had significant improvement.  She was still having bowel movements but they became more formed.  We titrated up this medication to twice daily dosing and by evaluation on 12/17 patient had only had 1 bowel movement in 24 hours.  Given her persistent diarrhea she had subsequent electrolyte abnormalities, hypoglycemia, and developed an AKI.  This was prerenal and resolved to baseline after fluids.  Her electrolytes were corrected as needed.  Patient also demonstrated leukopenia.  Peripheral smear revealed atypical lymphocytes, teardrop cells, burr cells, giant platelets.  This was reviewed by hematology who reports it is likely all secondary to her ongoing infection.  Recommend repeat CBC in 3 weeks to ensure complete resolution.  WBC resolved to normal prior to discharge.  We  recommend she follow-up with her primary care physician to repeat CT scan in 3 months given the central mesenteric stranding and nodule seen on CT here.  I extensively discussed these recommendations with her at bedside.  She endorses understanding.

## 2023-01-17 NOTE — Progress Notes (Signed)
Berniece Andreas to be D/C'd  per MD order.  Discussed with the patient and all questions fully answered.  VSS, Skin clean, dry and intact without evidence of skin break down, no evidence of skin tears noted.  IV catheter discontinued intact. Site without signs and symptoms of complications. Dressing and pressure applied.  An After Visit Summary was printed and given to the patient. Patient received prescription.  D/c education completed with patient/family including follow up instructions, medication list, d/c activities limitations if indicated, with other d/c instructions as indicated by MD - patient able to verbalize understanding, all questions fully answered.   Patient instructed to return to ED, call 911, or call MD for any changes in condition.   Patient to be escorted via WC, and D/C home via private auto.

## 2023-01-17 NOTE — Plan of Care (Signed)

## 2023-02-04 ENCOUNTER — Other Ambulatory Visit: Payer: Self-pay

## 2023-02-04 ENCOUNTER — Emergency Department (HOSPITAL_COMMUNITY)
Admission: EM | Admit: 2023-02-04 | Discharge: 2023-02-04 | Disposition: A | Discharge status: Home / Self Care | Payer: Medicare PPO | Attending: Emergency Medicine | Admitting: Emergency Medicine

## 2023-02-04 ENCOUNTER — Emergency Department (HOSPITAL_COMMUNITY): Payer: Medicare PPO

## 2023-02-04 ENCOUNTER — Encounter (HOSPITAL_COMMUNITY): Payer: Self-pay

## 2023-02-04 DIAGNOSIS — R112 Nausea with vomiting, unspecified: Secondary | ICD-10-CM | POA: Diagnosis present

## 2023-02-04 DIAGNOSIS — Z794 Long term (current) use of insulin: Secondary | ICD-10-CM | POA: Diagnosis not present

## 2023-02-04 DIAGNOSIS — K529 Noninfective gastroenteritis and colitis, unspecified: Secondary | ICD-10-CM | POA: Insufficient documentation

## 2023-02-04 DIAGNOSIS — Z7901 Long term (current) use of anticoagulants: Secondary | ICD-10-CM | POA: Diagnosis not present

## 2023-02-04 LAB — CBC WITH DIFFERENTIAL/PLATELET
Abs Immature Granulocytes: 0.02 10*3/uL (ref 0.00–0.07)
Basophils Absolute: 0 10*3/uL (ref 0.0–0.1)
Basophils Relative: 1 %
Eosinophils Absolute: 0.1 10*3/uL (ref 0.0–0.5)
Eosinophils Relative: 1 %
HCT: 40 % (ref 36.0–46.0)
Hemoglobin: 12.4 g/dL (ref 12.0–15.0)
Immature Granulocytes: 0 %
Lymphocytes Relative: 3 %
Lymphs Abs: 0.2 10*3/uL — ABNORMAL LOW (ref 0.7–4.0)
MCH: 28.6 pg (ref 26.0–34.0)
MCHC: 31 g/dL (ref 30.0–36.0)
MCV: 92.2 fL (ref 80.0–100.0)
Monocytes Absolute: 0.7 10*3/uL (ref 0.1–1.0)
Monocytes Relative: 10 %
Neutro Abs: 5.6 10*3/uL (ref 1.7–7.7)
Neutrophils Relative %: 85 %
Platelets: 377 10*3/uL (ref 150–400)
RBC: 4.34 MIL/uL (ref 3.87–5.11)
RDW: 13.4 % (ref 11.5–15.5)
WBC: 6.6 10*3/uL (ref 4.0–10.5)
nRBC: 0 % (ref 0.0–0.2)

## 2023-02-04 LAB — COMPREHENSIVE METABOLIC PANEL
ALT: 31 U/L (ref 0–44)
AST: 41 U/L (ref 15–41)
Albumin: 3.7 g/dL (ref 3.5–5.0)
Alkaline Phosphatase: 69 U/L (ref 38–126)
Anion gap: 14 (ref 5–15)
BUN: 20 mg/dL (ref 8–23)
CO2: 22 mmol/L (ref 22–32)
Calcium: 9.8 mg/dL (ref 8.9–10.3)
Chloride: 105 mmol/L (ref 98–111)
Creatinine, Ser: 1.19 mg/dL — ABNORMAL HIGH (ref 0.44–1.00)
GFR, Estimated: 45 mL/min — ABNORMAL LOW (ref >60)
Glucose, Bld: 98 mg/dL (ref 70–99)
Potassium: 4.2 mmol/L (ref 3.5–5.1)
Sodium: 141 mmol/L (ref 135–145)
Total Bilirubin: 0.9 mg/dL (ref 0.0–1.2)
Total Protein: 6.4 g/dL — ABNORMAL LOW (ref 6.5–8.1)

## 2023-02-04 LAB — LIPASE, BLOOD: Lipase: 42 U/L (ref 11–51)

## 2023-02-04 LAB — CBG MONITORING, ED: Glucose-Capillary: 107 mg/dL — ABNORMAL HIGH (ref 70–99)

## 2023-02-04 MED ORDER — ONDANSETRON HCL 4 MG/2ML IJ SOLN
4.0000 mg | Freq: Once | INTRAMUSCULAR | Status: AC
  Administered 2023-02-04: 4 mg via INTRAVENOUS
  Filled 2023-02-04: qty 2

## 2023-02-04 MED ORDER — LOPERAMIDE HCL 2 MG PO CAPS
2.0000 mg | ORAL_CAPSULE | Freq: Once | ORAL | Status: AC
  Administered 2023-02-04: 2 mg via ORAL
  Filled 2023-02-04: qty 1

## 2023-02-04 MED ORDER — SODIUM CHLORIDE 0.9 % IV BOLUS
500.0000 mL | Freq: Once | INTRAVENOUS | Status: AC
  Administered 2023-02-04: 500 mL via INTRAVENOUS

## 2023-02-04 MED ORDER — ONDANSETRON HCL 4 MG PO TABS: 4.0000 mg | ORAL_TABLET | Freq: Four times a day (QID) | ORAL | 0 refills | Status: DC

## 2023-02-04 MED ORDER — ONDANSETRON 4 MG PO TBDP
4.0000 mg | ORAL_TABLET | Freq: Once | ORAL | Status: AC
  Administered 2023-02-04: 4 mg via ORAL
  Filled 2023-02-04: qty 1

## 2023-02-04 NOTE — ED Notes (Signed)
 Pt reports after christmas she noticed increased pedal edema with pain during ambulation. Pt reports she was seen by her PCP and was taken on the her losartan and to continue meds.

## 2023-02-04 NOTE — ED Notes (Addendum)
 Pt reports very watery and yellowish diarrhea. Pt reports she had a solid stool this morning follow by multiple episodes of diarrhea.

## 2023-02-04 NOTE — Discharge Instructions (Addendum)
 While you are in the emergency department, had labs done that were normal.  Your CT scan was also normal.  Like we discussed, I think that you likely have the norovirus that is going around.  You can take Zofran  every 6 hours for nausea.  I have sent this to the [facility] on [address] this.  Symptoms usually last 24 to 48 hours.

## 2023-02-04 NOTE — ED Triage Notes (Signed)
 Pt c.o continued n.v.d, pt was admitted last month with a full workup including a colonoscopy and they could not tell her what was causing her symptoms. Pt a.o

## 2023-02-04 NOTE — ED Provider Notes (Signed)
 [place] EMERGENCY DEPARTMENT AT [facility] Provider Note   CSN: [id number] Arrival date & time: 02/04/23  1517     History  Chief Complaint  Patient presents with   Emesis   Diarrhea    Donna Wiley is a 83 y.o. female.  83 year old female here today due to diarrhea and emesis which began abruptly this morning.  Patient had issues with diarrhea that required hospitalization, however those resolved.  Then abruptly she began to have episodes of vomiting diarrhea and abdominal discomfort.   Emesis Associated symptoms: diarrhea   Diarrhea Associated symptoms: vomiting        Home Medications Prior to Admission medications   Medication Sig Start Date End Date Taking? Authorizing Provider  ondansetron  (ZOFRAN ) 4 MG tablet Take 1 tablet (4 mg total) by mouth every 6 (six) hours. 02/04/23  Yes Mannie Pac T, DO  b complex vitamins capsule Take 1 capsule by mouth daily.    [provider]  calcium citrate (CALCITRATE - DOSED IN MG ELEMENTAL CALCIUM) 950 (200 Ca) MG tablet Take 500 mg of elemental calcium by mouth 2 (two) times daily.    [provider]  cholestyramine  (QUESTRAN ) 4 g packet Take 1 packet (4 g total) by mouth 2 (two) times daily for 14 days. 01/17/23 01/31/23  Dezii, Alexandra, DO  clobetasol  cream (TEMOVATE ) 0.05 % Apply 1 Application topically 2 (two) times daily as needed (Dermatitis). 09/15/21   [provider]  diltiazem  (CARDIZEM  CD) 180 MG 24 hr capsule Take 180 mg by mouth at bedtime.    [provider]  EPINEPHrine  0.3 mg/0.3 mL IJ SOAJ injection Inject 0.3 mg into the muscle as needed for anaphylaxis. 06/21/21   [provider]  flecainide  (TAMBOCOR ) 50 MG tablet Take 50-100 mg by mouth See admin instructions. Take 50mg  (1 tablet) by mouth every morning and 100mg  (2 tablets) every evening.    [provider]  insulin  lispro (HUMALOG) 100 UNIT/ML injection Inject 0-15 Units into the skin  4 (four) times daily.    [provider]  LORazepam  (ATIVAN ) 0.5 MG tablet Take 0.5 mg by mouth at bedtime. 12/10/22   [provider]  mirabegron ER (MYRBETRIQ) 50 MG TB24 tablet Take 50 mg by mouth daily. 07/13/21 01/10/23  [provider]  olmesartan-hydrochlorothiazide (BENICAR HCT) 40-12.5 MG tablet Take 1 tablet by mouth daily. 02/17/21   [provider]  rivaroxaban  (XARELTO ) 20 MG TABS tablet Take 20 mg by mouth every evening.    [provider]  simvastatin  (ZOCOR ) 20 MG tablet Take 10 mg by mouth at bedtime.    [provider]      Allergies    Iodine, Nickel, Charentais melon (french melon), Cherry, Codeine, Ivp dye [iodinated contrast media], Other, Shellfish allergy , Sulfa antibiotics, and Tape    Review of Systems   Review of Systems  Gastrointestinal:  Positive for diarrhea and vomiting.    Physical Exam Updated Vital Signs BP (!) 124/55   Pulse 98   Temp 97.9 F (36.6 C) (Oral)   Resp (!) 21   Ht 5' 2 (1.575 m)   Wt 61.2 kg   SpO2 100%   BMI 24.69 kg/m  Physical Exam Vitals reviewed.  Cardiovascular:     Rate and Rhythm: Normal rate.  Pulmonary:     Effort: Pulmonary effort is normal.  Abdominal:     General: Abdomen is flat. There is no distension.     Palpations: Abdomen is soft.  Tenderness: There is no guarding.  Neurological:     General: No focal deficit present.     Mental Status: She is alert.     Gait: Gait normal.     ED Results / Procedures / Treatments   Labs (all labs ordered are listed, but only abnormal results are displayed) Labs Reviewed  CBC WITH DIFFERENTIAL/PLATELET - Abnormal; Notable for the following components:      Result Value   Lymphs Abs 0.2 (*)    All other components within normal limits  COMPREHENSIVE METABOLIC PANEL - Abnormal; Notable for the following components:   Creatinine, Ser 1.19 (*)    Total Protein 6.4 (*)    GFR, Estimated 45 (*)    All other  components within normal limits  CBG MONITORING, ED - Abnormal; Notable for the following components:   Glucose-Capillary 107 (*)    All other components within normal limits  RESP PANEL BY RT-PCR (RSV, FLU A&B, COVID)  RVPGX2  LIPASE, BLOOD    EKG None  Radiology CT ABDOMEN PELVIS WO CONTRAST Result Date: 02/04/2023 CLINICAL DATA:  Nausea and vomiting EXAM: CT ABDOMEN AND PELVIS WITHOUT CONTRAST TECHNIQUE: Multidetector CT imaging of the abdomen and pelvis was performed following the standard protocol without IV contrast. RADIATION DOSE REDUCTION: This exam was performed according to the departmental dose-optimization program which includes automated exposure control, adjustment of the mA and/or kV according to patient size and/or use of iterative reconstruction technique. COMPARISON:  01/06/2023 FINDINGS: Lower chest: No acute abnormality. Hepatobiliary: No focal liver abnormality is seen. Status post cholecystectomy. No biliary dilatation. Pancreas: Unremarkable. No pancreatic ductal dilatation or surrounding inflammatory changes. Spleen: Normal in size without focal abnormality. Adrenals/Urinary Tract: Adrenal glands are within normal limits. Kidneys demonstrate a normal appearance bilaterally. No renal calculi or obstructive changes are seen. The bladder is decompressed. Stomach/Bowel: No obstructive or inflammatory changes of the colon are noted. The appendix has been surgically removed. Small bowel shows fluid throughout without obstructive change. This may represent some mild enteritis. Vascular/Lymphatic: Aortic atherosclerosis. Stable stranding in the mesentery is noted with scattered small nodes. The overall appearance is similar to that seen on the prior exam. Reproductive: Status post hysterectomy. No adnexal masses. Other: No abdominal wall hernia or abnormality. No abdominopelvic ascites. Musculoskeletal: No acute or significant osseous findings. IMPRESSION: Fluid throughout the small  bowel which may represent some mild enteritis. Stable appearance of mesenteric stranding with small lymph nodes. No significant progression is noted. This may simply represent mesenteric adenitis. Electronically Signed   By: Oneil Devonshire M.D.   On: 02/04/2023 17:26    Procedures Procedures    Medications Ordered in ED Medications  ondansetron  (ZOFRAN -ODT) disintegrating tablet 4 mg (4 mg Oral Given 02/04/23 1543)  loperamide  (IMODIUM ) capsule 2 mg (2 mg Oral Given 02/04/23 1832)  ondansetron  (ZOFRAN ) injection 4 mg (4 mg Intravenous Given 02/04/23 1831)  sodium chloride  0.9 % bolus 500 mL (500 mLs Intravenous Bolus 02/04/23 1828)    ED Course/ Medical Decision Making/ A&P                                 Medical Decision Making 83 year old female here today for vomiting and diarrhea.  Differential diagnoses include enteritis, gastroenteritis, norovirus, viral syndrome, obstruction, less likely C. difficile.  Plan-on exam, patient overall looks well.  With the sudden onset of vomiting diarrhea, my concern is norovirus.  We are currently seeing high volumes  of norovirus in our community.  Provide the patient some Zofran  which did improve her symptoms.  Provide some IV fluids.  CT imaging negative.  Did order labs on the patient which showed no leukocytosis.  Creatinine roughly at baseline.  Did receive some fluids.  Will discharge home.  Amount and/or Complexity of Data Reviewed Labs: ordered. Radiology: ordered.  Risk Prescription drug management.           Final Clinical Impression(s) / ED Diagnoses Final diagnoses:  Gastroenteritis    Rx / DC Orders ED Discharge Orders          Ordered    ondansetron  (ZOFRAN ) 4 MG tablet  Every 6 hours        02/04/23 2045              Mannie Pac T, DO 02/04/23 2045

## 2023-02-04 NOTE — ED Provider Triage Note (Signed)
 Emergency Medicine Provider Triage Evaluation Note  Donna Wiley , a 83 y.o. female  was evaluated in triage.  Pt complains of sudden onset of nausea, vomiting diarrhea that began a few hours earlier.  Patient had a prolonged hospitalization in December for diarrhea..  Review of Systems   Physical Exam  BP (!) 124/58 (BP Location: Right Arm)   Pulse (!) 103   Temp 98.4 F (36.9 C) (Oral)   Resp 16   Ht 5' 2 (1.575 m)   Wt 61.2 kg   SpO2 99%   BMI 24.69 kg/m  Gen:   Awake, no distress   Resp:  Normal effort  MSK:   Moves extremities without difficulty  Other:  Abdomen soft.  Medical Decision Making  Medically screening exam initiated at 3:30 PM.  Appropriate orders placed.  Will Donna Wiley was informed that the remainder of the evaluation will be completed by another provider, this initial triage assessment does not replace that evaluation, and the importance of remaining in the ED until their evaluation is complete.  Patient with history of chronic diarrhea which seem to have stopped, suddenly having vomiting diarrhea.  Does sound a bit like norovirus.  Zofran  provided.  Abdomen soft.  Labs ordered.   Donna Pac T, DO 02/04/23 1530

## 2023-02-13 ENCOUNTER — Inpatient Hospital Stay (HOSPITAL_BASED_OUTPATIENT_CLINIC_OR_DEPARTMENT_OTHER)
Admission: EM | Admit: 2023-02-13 | Discharge: 2023-02-23 | DRG: 394 | Disposition: A | Discharge status: Home / Self Care | Payer: Medicare PPO | Attending: Family Medicine | Admitting: Family Medicine

## 2023-02-13 ENCOUNTER — Encounter (HOSPITAL_BASED_OUTPATIENT_CLINIC_OR_DEPARTMENT_OTHER): Payer: Self-pay | Admitting: Emergency Medicine

## 2023-02-13 ENCOUNTER — Emergency Department (HOSPITAL_BASED_OUTPATIENT_CLINIC_OR_DEPARTMENT_OTHER): Payer: Medicare PPO

## 2023-02-13 ENCOUNTER — Other Ambulatory Visit: Payer: Self-pay

## 2023-02-13 DIAGNOSIS — E1065 Type 1 diabetes mellitus with hyperglycemia: Secondary | ICD-10-CM | POA: Diagnosis present

## 2023-02-13 DIAGNOSIS — R195 Other fecal abnormalities: Secondary | ICD-10-CM

## 2023-02-13 DIAGNOSIS — Y742 Prosthetic and other implants, materials and accessory general hospital and personal-use devices associated with adverse incidents: Secondary | ICD-10-CM | POA: Diagnosis not present

## 2023-02-13 DIAGNOSIS — K638219 Small intestinal bacterial overgrowth, unspecified: Secondary | ICD-10-CM | POA: Diagnosis not present

## 2023-02-13 DIAGNOSIS — E871 Hypo-osmolality and hyponatremia: Secondary | ICD-10-CM | POA: Diagnosis present

## 2023-02-13 DIAGNOSIS — E8721 Acute metabolic acidosis: Secondary | ICD-10-CM

## 2023-02-13 DIAGNOSIS — Z87442 Personal history of urinary calculi: Secondary | ICD-10-CM

## 2023-02-13 DIAGNOSIS — Z91013 Allergy to seafood: Secondary | ICD-10-CM

## 2023-02-13 DIAGNOSIS — Z9641 Presence of insulin pump (external) (internal): Secondary | ICD-10-CM | POA: Diagnosis present

## 2023-02-13 DIAGNOSIS — E876 Hypokalemia: Secondary | ICD-10-CM | POA: Diagnosis present

## 2023-02-13 DIAGNOSIS — M1990 Unspecified osteoarthritis, unspecified site: Secondary | ICD-10-CM | POA: Diagnosis present

## 2023-02-13 DIAGNOSIS — T85614A Breakdown (mechanical) of insulin pump, initial encounter: Secondary | ICD-10-CM | POA: Diagnosis not present

## 2023-02-13 DIAGNOSIS — K219 Gastro-esophageal reflux disease without esophagitis: Secondary | ICD-10-CM

## 2023-02-13 DIAGNOSIS — E10649 Type 1 diabetes mellitus with hypoglycemia without coma: Secondary | ICD-10-CM | POA: Diagnosis not present

## 2023-02-13 DIAGNOSIS — D509 Iron deficiency anemia, unspecified: Secondary | ICD-10-CM | POA: Diagnosis present

## 2023-02-13 DIAGNOSIS — R197 Diarrhea, unspecified: Principal | ICD-10-CM | POA: Diagnosis present

## 2023-02-13 DIAGNOSIS — K2980 Duodenitis without bleeding: Secondary | ICD-10-CM | POA: Diagnosis present

## 2023-02-13 DIAGNOSIS — Z9109 Other allergy status, other than to drugs and biological substances: Secondary | ICD-10-CM

## 2023-02-13 DIAGNOSIS — D649 Anemia, unspecified: Secondary | ICD-10-CM | POA: Insufficient documentation

## 2023-02-13 DIAGNOSIS — Z882 Allergy status to sulfonamides status: Secondary | ICD-10-CM

## 2023-02-13 DIAGNOSIS — K2970 Gastritis, unspecified, without bleeding: Secondary | ICD-10-CM | POA: Diagnosis present

## 2023-02-13 DIAGNOSIS — Z885 Allergy status to narcotic agent status: Secondary | ICD-10-CM

## 2023-02-13 DIAGNOSIS — Z9049 Acquired absence of other specified parts of digestive tract: Secondary | ICD-10-CM

## 2023-02-13 DIAGNOSIS — Z91048 Other nonmedicinal substance allergy status: Secondary | ICD-10-CM

## 2023-02-13 DIAGNOSIS — E860 Dehydration: Principal | ICD-10-CM | POA: Diagnosis present

## 2023-02-13 DIAGNOSIS — Z7901 Long term (current) use of anticoagulants: Secondary | ICD-10-CM

## 2023-02-13 DIAGNOSIS — E7800 Pure hypercholesterolemia, unspecified: Secondary | ICD-10-CM | POA: Diagnosis present

## 2023-02-13 DIAGNOSIS — K8689 Other specified diseases of pancreas: Secondary | ICD-10-CM | POA: Diagnosis present

## 2023-02-13 DIAGNOSIS — Z91041 Radiographic dye allergy status: Secondary | ICD-10-CM

## 2023-02-13 DIAGNOSIS — K529 Noninfective gastroenteritis and colitis, unspecified: Principal | ICD-10-CM | POA: Diagnosis present

## 2023-02-13 DIAGNOSIS — Z79899 Other long term (current) drug therapy: Secondary | ICD-10-CM

## 2023-02-13 DIAGNOSIS — Z794 Long term (current) use of insulin: Secondary | ICD-10-CM

## 2023-02-13 DIAGNOSIS — N179 Acute kidney failure, unspecified: Secondary | ICD-10-CM

## 2023-02-13 DIAGNOSIS — Z90710 Acquired absence of both cervix and uterus: Secondary | ICD-10-CM

## 2023-02-13 DIAGNOSIS — I350 Nonrheumatic aortic (valve) stenosis: Secondary | ICD-10-CM | POA: Diagnosis present

## 2023-02-13 DIAGNOSIS — I4819 Other persistent atrial fibrillation: Secondary | ICD-10-CM | POA: Insufficient documentation

## 2023-02-13 DIAGNOSIS — Z91018 Allergy to other foods: Secondary | ICD-10-CM

## 2023-02-13 DIAGNOSIS — E109 Type 1 diabetes mellitus without complications: Secondary | ICD-10-CM | POA: Insufficient documentation

## 2023-02-13 LAB — I-STAT VENOUS BLOOD GAS, ED
Acid-base deficit: 15 mmol/L — ABNORMAL HIGH (ref 0.0–2.0)
Bicarbonate: 12.3 mmol/L — ABNORMAL LOW (ref 20.0–28.0)
Calcium, Ion: 1.27 mmol/L (ref 1.15–1.40)
HCT: 35 % — ABNORMAL LOW (ref 36.0–46.0)
Hemoglobin: 11.9 g/dL — ABNORMAL LOW (ref 12.0–15.0)
O2 Saturation: 38 %
Potassium: 4 mmol/L (ref 3.5–5.1)
Sodium: 136 mmol/L (ref 135–145)
TCO2: 13 mmol/L — ABNORMAL LOW (ref 22–32)
pCO2, Ven: 31.9 mm[Hg] — ABNORMAL LOW (ref 44–60)
pH, Ven: 7.193 — CL (ref 7.25–7.43)
pO2, Ven: 27 mm[Hg] — CL (ref 32–45)

## 2023-02-13 LAB — COMPREHENSIVE METABOLIC PANEL
ALT: 21 U/L (ref 0–44)
AST: 21 U/L (ref 15–41)
Albumin: 3.4 g/dL — ABNORMAL LOW (ref 3.5–5.0)
Alkaline Phosphatase: 54 U/L (ref 38–126)
Anion gap: 15 (ref 5–15)
BUN: 46 mg/dL — ABNORMAL HIGH (ref 8–23)
CO2: 10 mmol/L — ABNORMAL LOW (ref 22–32)
Calcium: 8.4 mg/dL — ABNORMAL LOW (ref 8.9–10.3)
Chloride: 103 mmol/L (ref 98–111)
Creatinine, Ser: 2.79 mg/dL — ABNORMAL HIGH (ref 0.44–1.00)
GFR, Estimated: 16 mL/min — ABNORMAL LOW (ref >60)
Glucose, Bld: 306 mg/dL — ABNORMAL HIGH (ref 70–99)
Potassium: 3.9 mmol/L (ref 3.5–5.1)
Sodium: 128 mmol/L — ABNORMAL LOW (ref 135–145)
Total Bilirubin: 0.7 mg/dL (ref 0.0–1.2)
Total Protein: 6.1 g/dL — ABNORMAL LOW (ref 6.5–8.1)

## 2023-02-13 LAB — C DIFFICILE QUICK SCREEN W PCR REFLEX
C Diff antigen: NEGATIVE
C Diff interpretation: NOT DETECTED
C Diff toxin: NEGATIVE

## 2023-02-13 LAB — BASIC METABOLIC PANEL
Anion gap: 12 (ref 5–15)
BUN: 42 mg/dL — ABNORMAL HIGH (ref 8–23)
CO2: 11 mmol/L — ABNORMAL LOW (ref 22–32)
Calcium: 8.1 mg/dL — ABNORMAL LOW (ref 8.9–10.3)
Chloride: 114 mmol/L — ABNORMAL HIGH (ref 98–111)
Creatinine, Ser: 2 mg/dL — ABNORMAL HIGH (ref 0.44–1.00)
GFR, Estimated: 24 mL/min — ABNORMAL LOW (ref >60)
Glucose, Bld: 213 mg/dL — ABNORMAL HIGH (ref 70–99)
Potassium: 3.4 mmol/L — ABNORMAL LOW (ref 3.5–5.1)
Sodium: 137 mmol/L (ref 135–145)

## 2023-02-13 LAB — CBC
HCT: 35.2 % — ABNORMAL LOW (ref 36.0–46.0)
Hemoglobin: 11.2 g/dL — ABNORMAL LOW (ref 12.0–15.0)
MCH: 28.5 pg (ref 26.0–34.0)
MCHC: 31.8 g/dL (ref 30.0–36.0)
MCV: 89.6 fL (ref 80.0–100.0)
Platelets: 342 10*3/uL (ref 150–400)
RBC: 3.93 MIL/uL (ref 3.87–5.11)
RDW: 13.5 % (ref 11.5–15.5)
WBC: 7.1 10*3/uL (ref 4.0–10.5)
nRBC: 0 % (ref 0.0–0.2)

## 2023-02-13 LAB — LACTIC ACID, PLASMA
Lactic Acid, Venous: 1.6 mmol/L (ref 0.5–1.9)
Lactic Acid, Venous: 0.9 mmol/L (ref 0.5–1.9)

## 2023-02-13 LAB — LIPASE, BLOOD: Lipase: 28 U/L (ref 11–51)

## 2023-02-13 MED ORDER — ONDANSETRON HCL 4 MG/2ML IJ SOLN: 4.0000 mg | Freq: Four times a day (QID) | INTRAMUSCULAR | Status: DC | PRN

## 2023-02-13 MED ORDER — SODIUM CHLORIDE 0.9 % IV BOLUS (SEPSIS)
1000.0000 mL | Freq: Once | INTRAVENOUS | Status: AC
  Administered 2023-02-13: 1000 mL via INTRAVENOUS

## 2023-02-13 MED ORDER — ACETAMINOPHEN 650 MG RE SUPP: 650.0000 mg | Freq: Four times a day (QID) | RECTAL | Status: DC | PRN

## 2023-02-13 MED ORDER — ONDANSETRON HCL 4 MG PO TABS: 4.0000 mg | ORAL_TABLET | Freq: Four times a day (QID) | ORAL | Status: DC | PRN

## 2023-02-13 MED ORDER — LORAZEPAM 1 MG PO TABS
1.0000 mg | ORAL_TABLET | Freq: Every day | ORAL | Status: DC
  Administered 2023-02-13 – 2023-02-22 (×10): 1 mg via ORAL
  Filled 2023-02-13 (×10): qty 1

## 2023-02-13 MED ORDER — SODIUM CHLORIDE 0.9 % IV BOLUS
1000.0000 mL | Freq: Once | INTRAVENOUS | Status: AC
  Administered 2023-02-13: 1000 mL via INTRAVENOUS

## 2023-02-13 MED ORDER — STERILE WATER FOR INJECTION IV SOLN: INTRAVENOUS | Status: DC

## 2023-02-13 MED ORDER — SODIUM BICARBONATE 8.4 % IV SOLN
INTRAVENOUS | Status: DC
  Filled 2023-02-13: qty 150
  Filled 2023-02-13: qty 1000

## 2023-02-13 MED ORDER — SODIUM BICARBONATE 8.4 % IV SOLN
INTRAVENOUS | Status: AC
  Filled 2023-02-13: qty 50

## 2023-02-13 MED ORDER — FLECAINIDE ACETATE 100 MG PO TABS
100.0000 mg | ORAL_TABLET | Freq: Every day | ORAL | Status: DC
Start: 2023-02-14 — End: 2023-02-23
  Administered 2023-02-13 – 2023-02-22 (×10): 100 mg via ORAL
  Filled 2023-02-13 (×11): qty 1

## 2023-02-13 MED ORDER — CHOLESTYRAMINE LIGHT 4 G PO PACK
4.0000 g | PACK | Freq: Two times a day (BID) | ORAL | Status: DC
  Administered 2023-02-14 – 2023-02-15 (×3): 4 g via ORAL
  Filled 2023-02-13 (×3): qty 1

## 2023-02-13 MED ORDER — ACETAMINOPHEN 325 MG PO TABS: 650.0000 mg | ORAL_TABLET | Freq: Four times a day (QID) | ORAL | Status: DC | PRN

## 2023-02-13 MED ORDER — FLECAINIDE ACETATE 50 MG PO TABS
50.0000 mg | ORAL_TABLET | Freq: Every day | ORAL | Status: DC
  Administered 2023-02-14 – 2023-02-23 (×10): 50 mg via ORAL
  Filled 2023-02-13 (×10): qty 1

## 2023-02-13 MED ORDER — FLECAINIDE ACETATE 50 MG PO TABS: 50.0000 mg | ORAL_TABLET | ORAL | Status: DC

## 2023-02-13 MED ORDER — SODIUM BICARBONATE 8.4 % IV SOLN
INTRAVENOUS | Status: AC
  Filled 2023-02-13: qty 100

## 2023-02-13 NOTE — ED Notes (Signed)
 Called [facility] to clarify sodium bicarb infusion mixing instructions.

## 2023-02-13 NOTE — ED Provider Notes (Signed)
 [name] Provider Note   CSN: [id number] Arrival date & time: 02/13/23  1450     History  Chief Complaint  Patient presents with   Diarrhea    Donna Wiley is a 83 y.o. female.  HPI      83 year old female with a history of atrial fibrillation on Xarelto , diabetes, hypercholesterolemia, asthma, SVT, admission in December for diarrhea who presents with concern for diarrhea, generalized weakness.  Was in the hospital for 8 days  10 days ago it started, then improved for Wed, then Thursday started again Day before yesterday had gone 20 times, yesterday was 15, one day was 25 times. No black or bloody stools. No nausea vomiting this round, no fever No dysuria Urinating less Lightheaded, no syncope   Had elevated fecal Cal protectant  Past Medical History:  Diagnosis Date   Arthritis    Asthma    childhood   Atrial fibrillation (HCC)    Cystocele    Diabetes mellitus    GERD (gastroesophageal reflux disease)    High cholesterol    Kidney stone    Rectocele    SVT (supraventricular tachycardia) (HCC)      Home Medications Prior to Admission medications   Medication Sig Start Date End Date Taking? Authorizing Provider  acetaminophen  (TYLENOL ) 500 MG tablet Take 1,000 mg by mouth every 6 (six) hours as needed for mild pain (pain score 1-3) or moderate pain (pain score 4-6).   Yes [provider]  b complex vitamins capsule Take 1 capsule by mouth every evening.   Yes [provider]  calcium citrate (CALCITRATE - DOSED IN MG ELEMENTAL CALCIUM) 950 (200 Ca) MG tablet Take 500 mg of elemental calcium by mouth 2 (two) times daily.   Yes [provider]  cholestyramine  (QUESTRAN ) 4 g packet Take 1 packet (4 g total) by mouth 2 (two) times daily for 14 days. Patient taking differently: Take 4 g by mouth daily. 01/17/23 02/14/23 Yes Dezii, Alexandra, DO  clobetasol  cream (TEMOVATE ) 0.05 %  Apply 1 Application topically 2 (two) times daily as needed (Dermatitis). 09/15/21  Yes [provider]  diltiazem  (CARDIZEM  CD) 180 MG 24 hr capsule Take 180 mg by mouth at bedtime.   Yes [provider]  EPINEPHrine  0.3 mg/0.3 mL IJ SOAJ injection Inject 0.3 mg into the muscle as needed for anaphylaxis. 06/21/21  Yes [provider]  flecainide  (TAMBOCOR ) 50 MG tablet Take 50-100 mg by mouth See admin instructions. Take 50mg  (1 tablet) by mouth every morning and 100mg  (2 tablets) every evening.   Yes [provider]  insulin  lispro (HUMALOG) 100 UNIT/ML injection Inject 0-15 Units into the skin 4 (four) times daily. Per pump   Yes [provider]  LORazepam  (ATIVAN ) 0.5 MG tablet Take 0.5 mg by mouth at bedtime. 12/10/22  Yes [provider]  mirabegron ER (MYRBETRIQ) 50 MG TB24 tablet Take 50 mg by mouth daily. 07/13/21 02/14/23 Yes [provider]  olmesartan-hydrochlorothiazide (BENICAR HCT) 40-12.5 MG tablet Take 1 tablet by mouth in the morning. 02/17/21  Yes [provider]  ondansetron  (ZOFRAN ) 4 MG tablet Take 1 tablet (4 mg total) by mouth every 6 (six) hours. Patient taking differently: Take 4 mg by mouth every 6 (six) hours as needed for nausea or vomiting. 02/04/23  Yes Mannie Pac T, DO  rivaroxaban  (XARELTO ) 20 MG TABS tablet Take 20 mg by mouth every evening.   Yes [provider]  simvastatin  (ZOCOR ) 20 MG tablet Take 10 mg by mouth at bedtime.   Yes [provider]  pantoprazole  (PROTONIX ) 40 MG tablet Take 40 mg by mouth daily. Patient not taking: Reported on 02/14/2023    [provider]      Allergies    Iodine, Ivp dye [iodinated contrast media], Nickel, Charentais melon (french melon), Cherry, Codeine, Other, Shellfish allergy , Sulfa antibiotics, and Tape    Review of Systems   Review of Systems  Physical Exam Updated Vital Signs BP (!) 126/54 (BP Location: Right Arm)   Pulse  90   Temp 98.6 F (37 C) (Oral)   Resp 16   Ht 5' 2 (1.575 m)   Wt 54.8 kg   SpO2 97%   BMI 22.10 kg/m  Physical Exam Vitals and nursing note reviewed.  Constitutional:      General: She is not in acute distress.    Appearance: Normal appearance. She is ill-appearing. She is not toxic-appearing or diaphoretic.  HENT:     Head: Normocephalic.  Eyes:     Conjunctiva/sclera: Conjunctivae normal.  Cardiovascular:     Rate and Rhythm: Normal rate and regular rhythm.     Pulses: Normal pulses.  Pulmonary:     Effort: Pulmonary effort is normal. No respiratory distress.  Abdominal:     Tenderness: There is abdominal tenderness (mild diffuse).  Musculoskeletal:        General: No deformity or signs of injury.     Cervical back: No rigidity.  Skin:    General: Skin is warm and dry.     Coloration: Skin is not jaundiced or pale.  Neurological:     General: No focal deficit present.     Mental Status: She is alert and oriented to person, place, and time.     ED Results / Procedures / Treatments   Labs (all labs ordered are listed, but only abnormal results are displayed) Labs Reviewed  COMPREHENSIVE METABOLIC PANEL - Abnormal; Notable for the following components:      Result Value   Sodium 128 (*)    CO2 10 (*)    Glucose, Bld 306 (*)    BUN 46 (*)    Creatinine, Ser 2.79 (*)    Calcium 8.4 (*)    Total Protein 6.1 (*)    Albumin 3.4 (*)    GFR, Estimated 16 (*)    All other components within normal limits  CBC - Abnormal; Notable for the following components:   Hemoglobin 11.2 (*)    HCT 35.2 (*)    All other components within normal limits  BASIC METABOLIC PANEL - Abnormal; Notable for the following components:   Potassium 3.4 (*)    Chloride 114 (*)    CO2 11 (*)    Glucose, Bld 213 (*)    BUN 42 (*)    Creatinine, Ser 2.00 (*)    Calcium 8.1 (*)    GFR, Estimated 24 (*)    All other components within normal limits  BASIC METABOLIC PANEL - Abnormal;  Notable for the following components:   CO2 14 (*)    Glucose, Bld 210 (*)    BUN 37 (*)    Creatinine, Ser 1.55 (*)    Calcium 7.8 (*)    GFR, Estimated 33 (*)    All other components within normal limits  CBC - Abnormal; Notable for the following components:   WBC 3.8 (*)    RBC 3.27 (*)    Hemoglobin  9.3 (*)    HCT 28.6 (*)    All other components within normal limits  MAGNESIUM  - Abnormal; Notable for the following components:   Magnesium  1.6 (*)    All other components within normal limits  I-STAT VENOUS BLOOD GAS, ED - Abnormal; Notable for the following components:   pH, Ven 7.193 (*)    pCO2, Ven 31.9 (*)    pO2, Ven 27 (*)    Bicarbonate 12.3 (*)    TCO2 13 (*)    Acid-base deficit 15.0 (*)    HCT 35.0 (*)    Hemoglobin 11.9 (*)    All other components within normal limits  GASTROINTESTINAL PANEL BY PCR, STOOL (REPLACES STOOL CULTURE)  C DIFFICILE QUICK SCREEN W PCR REFLEX    LIPASE, BLOOD  LACTIC ACID, PLASMA  LACTIC ACID, PLASMA  URINALYSIS, ROUTINE W REFLEX MICROSCOPIC    EKG None  Radiology CT ABDOMEN PELVIS WO CONTRAST Result Date: 02/13/2023 CLINICAL DATA:  Recurrent and persistent diarrhea EXAM: CT ABDOMEN AND PELVIS WITHOUT CONTRAST TECHNIQUE: Multidetector CT imaging of the abdomen and pelvis was performed following the standard protocol without IV contrast. RADIATION DOSE REDUCTION: This exam was performed according to the departmental dose-optimization program which includes automated exposure control, adjustment of the mA and/or kV according to patient size and/or use of iterative reconstruction technique. COMPARISON:  02/04/2023 FINDINGS: Lower chest: No acute abnormality. Hepatobiliary: No focal liver abnormality is seen. Status post cholecystectomy. No biliary dilatation. Pancreas: No focal abnormality or ductal dilatation. Spleen: No focal abnormality.  Normal size. Adrenals/Urinary Tract: No adrenal abnormality. No focal renal abnormality. No stones  or hydronephrosis. Urinary bladder is unremarkable. Stomach/Bowel: Fluid throughout the large intestine may be related to diarrhea. No bowel wall thickening or evidence of obstruction. Vascular/Lymphatic: Aortoiliac atherosclerosis. No evidence of aneurysm or adenopathy. Reproductive: Prior hysterectomy.  No adnexal masses. Other: No free fluid or free air. Musculoskeletal: No acute bony abnormality. IMPRESSION: Fluid with scattered air-fluid levels throughout the colon compatible with given history of diarrhea. No evidence of bowel inflammation, wall thickening or bowel obstruction. Electronically Signed   By: Franky Crease M.D.   On: 02/13/2023 17:50    Procedures Procedures    Medications Ordered in ED Medications  sodium bicarbonate  150 mEq in dextrose  5 % 1,150 mL infusion ( Intravenous New Bag/Given 02/14/23 0555)  sodium bicarbonate  1 mEq/mL injection (100 mEq  See Procedure Record 02/13/23 1841)  sodium bicarbonate  1 mEq/mL injection (50 mEq  See Procedure Record 02/13/23 1841)  acetaminophen  (TYLENOL ) tablet 650 mg (has no administration in time range)    Or  acetaminophen  (TYLENOL ) suppository 650 mg (has no administration in time range)  ondansetron  (ZOFRAN ) tablet 4 mg (has no administration in time range)    Or  ondansetron  (ZOFRAN ) injection 4 mg (has no administration in time range)  cholestyramine  light (PREVALITE ) packet 4 g (4 g Oral Given 02/14/23 1000)  LORazepam  (ATIVAN ) tablet 1 mg (1 mg Oral Given 02/13/23 2348)  flecainide  (TAMBOCOR ) tablet 50 mg (50 mg Oral Given 02/14/23 0900)    And  flecainide  (TAMBOCOR ) tablet 100 mg (100 mg Oral Given 02/13/23 2347)  Oral care mouth rinse (has no administration in time range)  sodium chloride  0.9 % bolus 1,000 mL (0 mLs Intravenous Stopped 02/13/23 1814)  sodium chloride  0.9 % bolus 1,000 mL (0 mLs Intravenous Stopped 02/13/23 1814)  potassium chloride  10 mEq in 100 mL IVPB (0 mEq Intravenous Stopped 02/14/23 0658)  magnesium  sulfate  IVPB 2 g 50  mL (2 g Intravenous New Bag/Given 02/14/23 0904)    ED Course/ Medical Decision Making/ A&P                                  83 year old female with a history of atrial fibrillation on Xarelto , diabetes, hypercholesterolemia, asthma, SVT, admission in December for diarrhea who presents with concern for diarrhea, generalized weakness.  CT abdomen pelvis performed with no evidence of diverticulitis, bowel inflammation or other acute abnormalities.   Labs completed and evaluated by me show no leukocytosis, mild anemia, and CMP consistent with dehydration with hyponatremia, AKI, nonanion gap metabolic acidosis.  No pancreatitis. pH 7.19.  Discussed with Nephrology and will start bicarb gtt, recheck BMP in 2 hours. Discussed with Dr. Legrand GI and she will be added to the list to be seen given severe diarrhea, recent GI work up.   Admitted for dehydration, severe diarrhea, generalized weakness, electrolyte abnormalities, AKI and acidosis.        Final Clinical Impression(s) / ED Diagnoses Final diagnoses:  Dehydration  AKI (acute kidney injury) (HCC)  Hyponatremia  Acute metabolic acidosis  Diarrhea, unspecified type    Rx / DC Orders ED Discharge Orders     None         Dreama Longs, MD 02/14/23 1012

## 2023-02-13 NOTE — ED Notes (Signed)
 ED MD notified of critical ph.

## 2023-02-13 NOTE — ED Triage Notes (Signed)
 Recurrent and persistent diarrhea x 5 days , admitted for same 10 days ago , Dx with gastritis and Norovirus . Lethargy .

## 2023-02-13 NOTE — ED Notes (Signed)
 [facility] called for transport, NO ETA ED Nurse will call floor to give report Called @ 20:21

## 2023-02-13 NOTE — ED Notes (Signed)
 ED TO INPATIENT HANDOFF REPORT  ED Nurse Name and Phone #: C. Jarold FERRARIS Paramedic [phone]  S Name/Age/Gender Donna Wiley 83 y.o. female Room/Bed: MH08/MH08  Code Status   Code Status: Prior  Home/SNF/Other Home Patient oriented to: self, place, time, and situation Is this baseline? Yes   Triage Complete: Triage complete  Chief Complaint Diarrhea [R19.7]  Triage Note Recurrent and persistent diarrhea x 5 days , admitted for same 10 days ago , Dx with gastritis and Norovirus . Lethargy .     Allergies Allergies  Allergen Reactions   Iodine Anaphylaxis   Nickel Rash   Charentais Melon (French Melon)    Cherry Swelling   Codeine    Ivp Dye [Iodinated Contrast Media]    Other     Tree nuts    Shellfish Allergy     Sulfa Antibiotics    Tape Dermatitis    Paper tape is best PAPER TAPE     Level of Care/Admitting Diagnosis ED Disposition     ED Disposition  Admit   Condition  --   Comment  Hospital Area: [facility] [[id number]]  Level of Care: Telemetry [5]  Admit to tele based on following criteria: Other see comments  Comments: AKI  Interfacility transfer: Yes  May place patient in observation at [facility] or [facility] if equivalent level of care is available:: Yes  Covid Evaluation: Asymptomatic - no recent exposure (last 10 days) testing not required  Diagnosis: Diarrhea [787.91.ICD-9-CM]  Admitting Physician: DEFAULT, PROVIDER [1]  Attending Physician: DEFAULT, PROVIDER [1]          B Medical/Surgery History Past Medical History:  Diagnosis Date   Arthritis    Asthma    childhood   Atrial fibrillation (HCC)    Cystocele    Diabetes mellitus    GERD (gastroesophageal reflux disease)    High cholesterol    Kidney stone    Rectocele    SVT (supraventricular tachycardia) (HCC)    Past Surgical History:  Procedure Laterality Date   ABDOMINAL HYSTERECTOMY     APPENDECTOMY     BIOPSY  01/13/2023    Procedure: BIOPSY;  Surgeon: Legrand Victory LITTIE DOUGLAS, MD;  Location: [facility] ENDOSCOPY;  Service: Gastroenterology;;   CATARACT EXTRACTION     CHOLECYSTECTOMY     COLONOSCOPY WITH PROPOFOL  N/A 01/13/2023   Procedure: COLONOSCOPY WITH PROPOFOL ;  Surgeon: Legrand Victory LITTIE DOUGLAS, MD;  Location: [facility] ENDOSCOPY;  Service: Gastroenterology;  Laterality: N/A;   FOOT SURGERY     HAND SURGERY     ROTATOR CUFF REPAIR       A IV Location/Drains/Wounds Patient Lines/Drains/Airways Status     Active Line/Drains/Airways     Name Placement date Placement time Site Days   Peripheral IV 02/04/23 20 G Right Antecubital 02/04/23  1550  Antecubital  9            Intake/Output Last 24 hours  Intake/Output Summary (Last 24 hours) at 02/13/2023 2029 Last data filed at 02/13/2023 1814 Gross per 24 hour  Intake 2000 ml  Output --  Net 2000 ml    Labs/Imaging Results for orders placed or performed during the hospital encounter of 02/13/23 (from the past 48 hours)  Lipase, blood     Status: None   Collection Time: 02/13/23  3:06 PM  Result Value Ref Range   Lipase 28 11 - 51 U/L    Comment: Performed at [facility], [address] [place],  [place] [address]  Comprehensive metabolic panel     Status: Abnormal   Collection Time: 02/13/23  3:06 PM  Result Value Ref Range   Sodium 128 (L) 135 - 145 mmol/L   Potassium 3.9 3.5 - 5.1 mmol/L   Chloride 103 98 - 111 mmol/L   CO2 10 (L) 22 - 32 mmol/L   Glucose, Bld 306 (H) 70 - 99 mg/dL    Comment: Glucose reference range applies only to samples taken after fasting for at least 8 hours.   BUN 46 (H) 8 - 23 mg/dL   Creatinine, Ser 7.20 (H) 0.44 - 1.00 mg/dL   Calcium 8.4 (L) 8.9 - 10.3 mg/dL   Total Protein 6.1 (L) 6.5 - 8.1 g/dL   Albumin 3.4 (L) 3.5 - 5.0 g/dL   AST 21 15 - 41 U/L   ALT 21 0 - 44 U/L   Alkaline Phosphatase 54 38 - 126 U/L   Total Bilirubin 0.7 0.0 - 1.2 mg/dL   GFR, Estimated 16 (L) >60 mL/min    Comment: (NOTE) Calculated  using the CKD-EPI Creatinine Equation (2021)    Anion gap 15 5 - 15    Comment: Performed at [facility], [address] [place], [place] [address]  CBC     Status: Abnormal   Collection Time: 02/13/23  3:06 PM  Result Value Ref Range   WBC 7.1 4.0 - 10.5 K/uL   RBC 3.93 3.87 - 5.11 MIL/uL   Hemoglobin 11.2 (L) 12.0 - 15.0 g/dL   HCT 64.7 (L) 63.9 - 53.9 %   MCV 89.6 80.0 - 100.0 fL   MCH 28.5 26.0 - 34.0 pg   MCHC 31.8 30.0 - 36.0 g/dL   RDW 86.4 88.4 - 84.4 %   Platelets 342 150 - 400 K/uL   nRBC 0.0 0.0 - 0.2 %    Comment: Performed at [facility], [address] [place], [place] [address]  Lactic acid, plasma     Status: None   Collection Time: 02/13/23  4:15 PM  Result Value Ref Range   Lactic Acid, Venous 1.6 0.5 - 1.9 mmol/L    Comment: Performed at [facility], [address] [place], [place] [address]  I-Stat venous blood gas, ([facility] ED, [facility], [facility])     Status: Abnormal   Collection Time: 02/13/23  4:35 PM  Result Value Ref Range   pH, Ven 7.193 (LL) 7.25 - 7.43   pCO2, Ven 31.9 (L) 44 - 60 mmHg   pO2, Ven 27 (LL) 32 - 45 mmHg   Bicarbonate 12.3 (L) 20.0 - 28.0 mmol/L   TCO2 13 (L) 22 - 32 mmol/L   O2 Saturation 38 %   Acid-base deficit 15.0 (H) 0.0 - 2.0 mmol/L   Sodium 136 135 - 145 mmol/L   Potassium 4.0 3.5 - 5.1 mmol/L   Calcium, Ion 1.27 1.15 - 1.40 mmol/L   HCT 35.0 (L) 36.0 - 46.0 %   Hemoglobin 11.9 (L) 12.0 - 15.0 g/dL   Collection site IV start    Drawn by Nurse    Sample type VENOUS    Comment NOTIFIED PHYSICIAN   Lactic acid, plasma     Status: None   Collection Time: 02/13/23  6:35 PM  Result Value Ref Range   Lactic Acid, Venous 0.9 0.5 - 1.9 mmol/L    Comment: Performed at [facility], [address] [place], [place] [address]  CT ABDOMEN PELVIS WO CONTRAST Result Date: 02/13/2023 CLINICAL DATA:  Recurrent and persistent diarrhea EXAM: CT ABDOMEN AND PELVIS WITHOUT CONTRAST TECHNIQUE:  Multidetector CT imaging of the abdomen and pelvis was performed following the standard protocol without IV contrast. RADIATION DOSE REDUCTION: This exam was performed according to the departmental dose-optimization program which includes automated exposure control, adjustment of the mA and/or kV according to patient size and/or use of iterative reconstruction technique. COMPARISON:  02/04/2023 FINDINGS: Lower chest: No acute abnormality. Hepatobiliary: No focal liver abnormality is seen. Status post cholecystectomy. No biliary dilatation. Pancreas: No focal abnormality or ductal dilatation. Spleen: No focal abnormality.  Normal size. Adrenals/Urinary Tract: No adrenal abnormality. No focal renal abnormality. No stones or hydronephrosis. Urinary bladder is unremarkable. Stomach/Bowel: Fluid throughout the large intestine may be related to diarrhea. No bowel wall thickening or evidence of obstruction. Vascular/Lymphatic: Aortoiliac atherosclerosis. No evidence of aneurysm or adenopathy. Reproductive: Prior hysterectomy.  No adnexal masses. Other: No free fluid or free air. Musculoskeletal: No acute bony abnormality. IMPRESSION: Fluid with scattered air-fluid levels throughout the colon compatible with given history of diarrhea. No evidence of bowel inflammation, wall thickening or bowel obstruction. Electronically Signed   By: Franky Crease M.D.   On: 02/13/2023 17:50    Pending Labs Unresulted Labs (From admission, onward)     Start     Ordered   02/13/23 1820  Urinalysis, Routine w reflex microscopic -  Once,   R        02/13/23 1820   02/13/23 1759  Basic metabolic panel  Once,   STAT        02/13/23 1758   02/13/23 1615  C Difficile Quick Screen w PCR reflex  (C Difficile quick screen w PCR reflex panel )  Once, for 24 hours,   URGENT       References:    CDiff Information Tool   02/13/23 1614   02/13/23 1614  Gastrointestinal Panel by PCR , Stool  (Gastrointestinal Panel by PCR, Stool                                                                                                                                                      **Does Not include CLOSTRIDIUM DIFFICILE testing. **If CDIFF testing is needed, place order from the C Difficile Testing order set.**)  Once,   URGENT        02/13/23 1614            Vitals/Pain Today's Vitals   02/13/23 1501 02/13/23 1502 02/13/23 1615 02/13/23 1949  BP: (!) 90/52  90/64 (!) 121/41  Pulse: 94  94 90  Resp: 18  18 18   Temp: 98 F (36.7 C)  98 F (36.7 C) 97.9 F (36.6 C)  TempSrc:    Oral  SpO2: 100%  100% 100%  PainSc:  0-No pain      Isolation Precautions Enteric precautions (UV disinfection)  Medications Medications  sodium bicarbonate  150 mEq in dextrose  5 % 1,150 mL infusion ( Intravenous New Bag/Given 02/13/23 1838)  sodium bicarbonate  1 mEq/mL injection (100 mEq  See Procedure Record 02/13/23 1841)  sodium bicarbonate  1 mEq/mL injection (50 mEq  See Procedure Record 02/13/23 1841)  sodium chloride  0.9 % bolus 1,000 mL (0 mLs Intravenous Stopped 02/13/23 1814)  sodium chloride  0.9 % bolus 1,000 mL (0 mLs Intravenous Stopped 02/13/23 1814)    Mobility walks     Focused Assessments     R Recommendations: See Admitting Provider Note  Report given to:   Additional Notes:

## 2023-02-14 DIAGNOSIS — E876 Hypokalemia: Secondary | ICD-10-CM | POA: Diagnosis not present

## 2023-02-14 DIAGNOSIS — I4819 Other persistent atrial fibrillation: Secondary | ICD-10-CM | POA: Diagnosis not present

## 2023-02-14 DIAGNOSIS — K529 Noninfective gastroenteritis and colitis, unspecified: Secondary | ICD-10-CM | POA: Diagnosis not present

## 2023-02-14 DIAGNOSIS — E1022 Type 1 diabetes mellitus with diabetic chronic kidney disease: Secondary | ICD-10-CM | POA: Diagnosis not present

## 2023-02-14 DIAGNOSIS — E871 Hypo-osmolality and hyponatremia: Secondary | ICD-10-CM | POA: Diagnosis not present

## 2023-02-14 DIAGNOSIS — Z9641 Presence of insulin pump (external) (internal): Secondary | ICD-10-CM | POA: Diagnosis not present

## 2023-02-14 DIAGNOSIS — N179 Acute kidney failure, unspecified: Secondary | ICD-10-CM | POA: Diagnosis not present

## 2023-02-14 DIAGNOSIS — E7800 Pure hypercholesterolemia, unspecified: Secondary | ICD-10-CM | POA: Diagnosis not present

## 2023-02-14 DIAGNOSIS — K638219 Small intestinal bacterial overgrowth, unspecified: Secondary | ICD-10-CM | POA: Diagnosis not present

## 2023-02-14 DIAGNOSIS — D509 Iron deficiency anemia, unspecified: Secondary | ICD-10-CM | POA: Diagnosis not present

## 2023-02-14 DIAGNOSIS — E860 Dehydration: Secondary | ICD-10-CM | POA: Diagnosis not present

## 2023-02-14 DIAGNOSIS — K8689 Other specified diseases of pancreas: Secondary | ICD-10-CM | POA: Diagnosis not present

## 2023-02-14 DIAGNOSIS — K2970 Gastritis, unspecified, without bleeding: Secondary | ICD-10-CM | POA: Diagnosis not present

## 2023-02-14 DIAGNOSIS — Y742 Prosthetic and other implants, materials and accessory general hospital and personal-use devices associated with adverse incidents: Secondary | ICD-10-CM | POA: Diagnosis not present

## 2023-02-14 DIAGNOSIS — Z87442 Personal history of urinary calculi: Secondary | ICD-10-CM | POA: Diagnosis not present

## 2023-02-14 DIAGNOSIS — I480 Paroxysmal atrial fibrillation: Secondary | ICD-10-CM

## 2023-02-14 DIAGNOSIS — K219 Gastro-esophageal reflux disease without esophagitis: Secondary | ICD-10-CM | POA: Diagnosis not present

## 2023-02-14 DIAGNOSIS — E1065 Type 1 diabetes mellitus with hyperglycemia: Secondary | ICD-10-CM | POA: Diagnosis not present

## 2023-02-14 DIAGNOSIS — R197 Diarrhea, unspecified: Secondary | ICD-10-CM | POA: Diagnosis present

## 2023-02-14 DIAGNOSIS — E8721 Acute metabolic acidosis: Secondary | ICD-10-CM | POA: Diagnosis not present

## 2023-02-14 DIAGNOSIS — K2980 Duodenitis without bleeding: Secondary | ICD-10-CM | POA: Diagnosis not present

## 2023-02-14 DIAGNOSIS — I350 Nonrheumatic aortic (valve) stenosis: Secondary | ICD-10-CM | POA: Diagnosis not present

## 2023-02-14 DIAGNOSIS — Z7901 Long term (current) use of anticoagulants: Secondary | ICD-10-CM | POA: Diagnosis not present

## 2023-02-14 DIAGNOSIS — T85614A Breakdown (mechanical) of insulin pump, initial encounter: Secondary | ICD-10-CM | POA: Diagnosis not present

## 2023-02-14 DIAGNOSIS — E10649 Type 1 diabetes mellitus with hypoglycemia without coma: Secondary | ICD-10-CM | POA: Diagnosis not present

## 2023-02-14 DIAGNOSIS — Z794 Long term (current) use of insulin: Secondary | ICD-10-CM | POA: Diagnosis not present

## 2023-02-14 LAB — GLUCOSE, CAPILLARY
Glucose-Capillary: 254 mg/dL — ABNORMAL HIGH (ref 70–99)
Glucose-Capillary: 274 mg/dL — ABNORMAL HIGH (ref 70–99)
Glucose-Capillary: 306 mg/dL — ABNORMAL HIGH (ref 70–99)
Glucose-Capillary: 327 mg/dL — ABNORMAL HIGH (ref 70–99)
Glucose-Capillary: 420 mg/dL — ABNORMAL HIGH (ref 70–99)
Glucose-Capillary: 66 mg/dL — ABNORMAL LOW (ref 70–99)
Glucose-Capillary: 72 mg/dL (ref 70–99)
Glucose-Capillary: 96 mg/dL (ref 70–99)

## 2023-02-14 LAB — GASTROINTESTINAL PANEL BY PCR, STOOL (REPLACES STOOL CULTURE)

## 2023-02-14 LAB — CBC
HCT: 28.6 % — ABNORMAL LOW (ref 36.0–46.0)
Hemoglobin: 9.3 g/dL — ABNORMAL LOW (ref 12.0–15.0)
MCH: 28.4 pg (ref 26.0–34.0)
MCHC: 32.5 g/dL (ref 30.0–36.0)
MCV: 87.5 fL (ref 80.0–100.0)
Platelets: 253 10*3/uL (ref 150–400)
RBC: 3.27 MIL/uL — ABNORMAL LOW (ref 3.87–5.11)
RDW: 13.5 % (ref 11.5–15.5)
WBC: 3.8 10*3/uL — ABNORMAL LOW (ref 4.0–10.5)
nRBC: 0 % (ref 0.0–0.2)

## 2023-02-14 LAB — BASIC METABOLIC PANEL
Anion gap: 10 (ref 5–15)
BUN: 37 mg/dL — ABNORMAL HIGH (ref 8–23)
CO2: 14 mmol/L — ABNORMAL LOW (ref 22–32)
Calcium: 7.8 mg/dL — ABNORMAL LOW (ref 8.9–10.3)
Chloride: 111 mmol/L (ref 98–111)
Creatinine, Ser: 1.55 mg/dL — ABNORMAL HIGH (ref 0.44–1.00)
GFR, Estimated: 33 mL/min — ABNORMAL LOW (ref >60)
Glucose, Bld: 210 mg/dL — ABNORMAL HIGH (ref 70–99)
Potassium: 3.5 mmol/L (ref 3.5–5.1)
Sodium: 135 mmol/L (ref 135–145)

## 2023-02-14 LAB — MAGNESIUM: Magnesium: 1.6 mg/dL — ABNORMAL LOW (ref 1.7–2.4)

## 2023-02-14 LAB — GLUCOSE, RANDOM: Glucose, Bld: 464 mg/dL — ABNORMAL HIGH (ref 70–99)

## 2023-02-14 MED ORDER — INSULIN ASPART 100 UNIT/ML IJ SOLN
4.0000 [IU] | Freq: Once | INTRAMUSCULAR | Status: AC
  Administered 2023-02-14: 4 [IU] via SUBCUTANEOUS

## 2023-02-14 MED ORDER — INSULIN ASPART 100 UNIT/ML IJ SOLN
20.0000 [IU] | Freq: Once | INTRAMUSCULAR | Status: AC
  Administered 2023-02-14: 20 [IU] via SUBCUTANEOUS

## 2023-02-14 MED ORDER — SODIUM CHLORIDE 0.9 % IV SOLN: INTRAVENOUS | Status: DC

## 2023-02-14 MED ORDER — ENSURE ENLIVE PO LIQD: 237.0000 mL | Freq: Three times a day (TID) | ORAL | Status: DC

## 2023-02-14 MED ORDER — INSULIN ASPART 100 UNIT/ML IJ SOLN: 10.0000 [IU] | Freq: Once | INTRAMUSCULAR | Status: DC

## 2023-02-14 MED ORDER — DIPHENOXYLATE-ATROPINE 2.5-0.025 MG PO TABS
1.0000 | ORAL_TABLET | Freq: Four times a day (QID) | ORAL | Status: DC
  Administered 2023-02-14 – 2023-02-23 (×32): 1 via ORAL
  Filled 2023-02-14 (×32): qty 1

## 2023-02-14 MED ORDER — MAGNESIUM SULFATE 2 GM/50ML IV SOLN
2.0000 g | Freq: Once | INTRAVENOUS | Status: AC
  Administered 2023-02-14: 2 g via INTRAVENOUS
  Filled 2023-02-14: qty 50

## 2023-02-14 MED ORDER — SODIUM BICARBONATE 650 MG PO TABS
650.0000 mg | ORAL_TABLET | Freq: Three times a day (TID) | ORAL | Status: DC
  Administered 2023-02-14 – 2023-02-23 (×27): 650 mg via ORAL
  Filled 2023-02-14 (×27): qty 1

## 2023-02-14 MED ORDER — INSULIN ASPART 100 UNIT/ML IJ SOLN: 0.0000 [IU] | Freq: Every day | INTRAMUSCULAR | Status: DC

## 2023-02-14 MED ORDER — INSULIN ASPART 100 UNIT/ML IJ SOLN
0.0000 [IU] | Freq: Three times a day (TID) | INTRAMUSCULAR | Status: DC
Start: 2023-02-14 — End: 2023-02-14

## 2023-02-14 MED ORDER — POTASSIUM CHLORIDE 10 MEQ/100ML IV SOLN
10.0000 meq | INTRAVENOUS | Status: AC
Start: 2023-02-14 — End: 2023-02-14
  Administered 2023-02-14 (×4): 10 meq via INTRAVENOUS
  Filled 2023-02-14 (×4): qty 100

## 2023-02-14 MED ORDER — ORAL CARE MOUTH RINSE: 15.0000 mL | OROMUCOSAL | Status: DC | PRN

## 2023-02-14 MED ORDER — INSULIN PUMP
Freq: Three times a day (TID) | SUBCUTANEOUS | Status: DC
  Filled 2023-02-14: qty 1

## 2023-02-14 NOTE — Inpatient Diabetes Management (Signed)
 Inpatient Diabetes Program Recommendations  AACE/ADA: New Consensus Statement on Inpatient Glycemic Control (2015)  Target Ranges:  Prepandial:   less than 140 mg/dL      Peak postprandial:   less than 180 mg/dL (1-2 hours)      Critically ill patients:  140 - 180 mg/dL   Lab Results  Component Value Date   GLUCAP 254 (H) 02/14/2023    Review of Glycemic Control  Diabetes history: DM1 Outpatient Diabetes medications: Insulin  pump Current orders for Inpatient glycemic control: Novolog  0-15 TID with meals and 0-5 HS  Endo - Dr Braulio Has Dexcom  Inpatient Diabetes Program Recommendations:    Spoke with pt at bedside regarding her diabetes. Pt states she accidentally pulled her insulin  pump insertion site out when up to bathroom. Daughter is bringing new supplies and Dexcom. Expected to be here within hour.   Will need Insulin  pump order set placed. D/C all SQ insulin  when pump starts.  Discussed with Rosina, RN.   Thank you. Shona Brandy, RD, LDN, [facility] Inpatient Diabetes Coordinator [phone]

## 2023-02-14 NOTE — H&P (Signed)
 History and Physical    Patient: Donna Wiley FMW:[id number] DOB: [birth year] DOA: 02/13/2023 DOS: the patient was seen and examined on 02/14/2023 PCP: Carles Seltzer, MD  Patient coming from: Home  Chief Complaint:  Chief Complaint  Patient presents with   Diarrhea   HPI: Donna Wiley is a 83 y.o. female with medical history significant for diabetes mellitus type 1 on an insulin  pump, atrial fibrillation on Xarelto , and aortic stenosis who presents with 6 weeks of on relenting watery diarrhea.  The patient was hospitalized about a month ago for 8 days because of his diarrhea.  She had a negative colonoscopy during that time she was negative for C. difficile during that time.  GI started her on twice daily cholestyramine  to take in addition to her Imodium .  It seems like her stools were improving.  She has had a couple days intermittently where she has not had diarrhea but the diarrhea keeps coming back.  She has no appetite.  She denies nausea and vomiting at this time.  She says food does not taste the same. The patient was seen in the emergency department a little over a week ago.  At that time she was having nausea and vomiting in addition to the diarrhea.  The ED diagnosed her with norovirus and after couple days her nausea and vomiting stopped.  She actually had a few days with no diarrhea but then it came back with a vengeance.  She has been dealing with the recurrent diarrhea nonstop for the last 5 days and came in for help.  She denies fevers or chills or significant abdominal pain.  Her abdomen did feel tender when palpated by doctors in the emergency department but she was surprised by that because it does not hurt otherwise. In the emergency department the patient did have a CT scan of her abdomen pelvis which revealed scattered air-fluid levels but no other pathology. The patient will be admitted by the hospitalist service.     Review of Systems: As mentioned in the  history of present illness. All other systems reviewed and are negative. Past Medical History:  Diagnosis Date   Arthritis    Asthma    childhood   Atrial fibrillation (HCC)    Cystocele    Diabetes mellitus    GERD (gastroesophageal reflux disease)    High cholesterol    Kidney stone    Rectocele    SVT (supraventricular tachycardia) (HCC)    Past Surgical History:  Procedure Laterality Date   ABDOMINAL HYSTERECTOMY     APPENDECTOMY     BIOPSY  01/13/2023   Procedure: BIOPSY;  Surgeon: Legrand Victory LITTIE DOUGLAS, MD;  Location: [facility] ENDOSCOPY;  Service: Gastroenterology;;   CATARACT EXTRACTION     CHOLECYSTECTOMY     COLONOSCOPY WITH PROPOFOL  N/A 01/13/2023   Procedure: COLONOSCOPY WITH PROPOFOL ;  Surgeon: Legrand Victory LITTIE DOUGLAS, MD;  Location: [facility] ENDOSCOPY;  Service: Gastroenterology;  Laterality: N/A;   FOOT SURGERY     HAND SURGERY     ROTATOR CUFF REPAIR     Social History:  reports that she has never smoked. She has never been exposed to tobacco smoke. She has never used smokeless tobacco. She reports that she does not drink alcohol and does not use drugs.  Allergies  Allergen Reactions   Iodine Anaphylaxis   Nickel Rash   Charentais Melon (French Melon)    Cherry Swelling   Codeine    Ivp Dye [Iodinated Contrast Media]  Other     Tree nuts    Shellfish Allergy     Sulfa Antibiotics    Tape Dermatitis    Paper tape is best PAPER TAPE     Family History  Problem Relation Age of Onset   Allergic rhinitis Daughter    Asthma Daughter    Lupus Daughter    Scoliosis Daughter    Breast cancer Neg Hx    Urticaria Neg Hx    Immunodeficiency Neg Hx    Angioedema Neg Hx    Atopy Neg Hx    Eczema Neg Hx     Prior to Admission medications   Medication Sig Start Date End Date Taking? Authorizing Provider  b complex vitamins capsule Take 1 capsule by mouth daily.    [provider]  calcium citrate (CALCITRATE - DOSED IN MG ELEMENTAL CALCIUM) 950 (200 Ca) MG  tablet Take 500 mg of elemental calcium by mouth 2 (two) times daily.    [provider]  cholestyramine  (QUESTRAN ) 4 g packet Take 1 packet (4 g total) by mouth 2 (two) times daily for 14 days. 01/17/23 01/31/23  Dezii, Alexandra, DO  clobetasol  cream (TEMOVATE ) 0.05 % Apply 1 Application topically 2 (two) times daily as needed (Dermatitis). 09/15/21   [provider]  diltiazem  (CARDIZEM  CD) 180 MG 24 hr capsule Take 180 mg by mouth at bedtime.    [provider]  EPINEPHrine  0.3 mg/0.3 mL IJ SOAJ injection Inject 0.3 mg into the muscle as needed for anaphylaxis. 06/21/21   [provider]  flecainide  (TAMBOCOR ) 50 MG tablet Take 50-100 mg by mouth See admin instructions. Take 50mg  (1 tablet) by mouth every morning and 100mg  (2 tablets) every evening.    [provider]  insulin  lispro (HUMALOG) 100 UNIT/ML injection Inject 0-15 Units into the skin 4 (four) times daily.    [provider]  LORazepam  (ATIVAN ) 0.5 MG tablet Take 0.5 mg by mouth at bedtime. 12/10/22   [provider]  mirabegron ER (MYRBETRIQ) 50 MG TB24 tablet Take 50 mg by mouth daily. 07/13/21 01/10/23  [provider]  olmesartan-hydrochlorothiazide (BENICAR HCT) 40-12.5 MG tablet Take 1 tablet by mouth daily. 02/17/21   [provider]  ondansetron  (ZOFRAN ) 4 MG tablet Take 1 tablet (4 mg total) by mouth every 6 (six) hours. 02/04/23   Mannie Pac T, DO  rivaroxaban  (XARELTO ) 20 MG TABS tablet Take 20 mg by mouth every evening.    [provider]  simvastatin  (ZOCOR ) 20 MG tablet Take 10 mg by mouth at bedtime.    [provider]    Physical Exam: Vitals:   02/13/23 1501 02/13/23 1615 02/13/23 1949 02/13/23 2219  BP: (!) 90/52 90/64 (!) 121/41 (!) 131/49  Pulse: 94 94 90 96  Resp: 18 18 18 18   Temp: 98 F (36.7 C) 98 F (36.7 C) 97.9 F (36.6 C) 98.2 F (36.8 C)  TempSrc:   Oral Oral  SpO2: 100% 100% 100% 100%  Weight:     54.8 kg  Height:    5' 2 (1.575 m)   Physical Exam:  General: No acute distress, well developed, well nourished HEENT: Normocephalic, atraumatic, PERRL Cardiovascular: Normal rate and rhythm. Distal pulses intact. Pulmonary: Normal pulmonary effort, normal breath sounds Gastrointestinal: Nondistended abdomen, soft, non-tender, normoactive bowel sounds, insulin  pump is on her left lower abdomen Musculoskeletal:Normal ROM, no lower ext edema Lymphadenopathy: No cervical LAD. Skin: Skin is warm and dry, her blood sugar monitor is on her  right upper arm Neuro: No focal deficits noted, AAOx3. PSYCH: Attentive and cooperative  Data Reviewed:  Results for orders placed or performed during the hospital encounter of 02/13/23 (from the past 24 hours)  Lipase, blood     Status: None   Collection Time: 02/13/23  3:06 PM  Result Value Ref Range   Lipase 28 11 - 51 U/L  Comprehensive metabolic panel     Status: Abnormal   Collection Time: 02/13/23  3:06 PM  Result Value Ref Range   Sodium 128 (L) 135 - 145 mmol/L   Potassium 3.9 3.5 - 5.1 mmol/L   Chloride 103 98 - 111 mmol/L   CO2 10 (L) 22 - 32 mmol/L   Glucose, Bld 306 (H) 70 - 99 mg/dL   BUN 46 (H) 8 - 23 mg/dL   Creatinine, Ser 7.20 (H) 0.44 - 1.00 mg/dL   Calcium 8.4 (L) 8.9 - 10.3 mg/dL   Total Protein 6.1 (L) 6.5 - 8.1 g/dL   Albumin 3.4 (L) 3.5 - 5.0 g/dL   AST 21 15 - 41 U/L   ALT 21 0 - 44 U/L   Alkaline Phosphatase 54 38 - 126 U/L   Total Bilirubin 0.7 0.0 - 1.2 mg/dL   GFR, Estimated 16 (L) >60 mL/min   Anion gap 15 5 - 15  CBC     Status: Abnormal   Collection Time: 02/13/23  3:06 PM  Result Value Ref Range   WBC 7.1 4.0 - 10.5 K/uL   RBC 3.93 3.87 - 5.11 MIL/uL   Hemoglobin 11.2 (L) 12.0 - 15.0 g/dL   HCT 64.7 (L) 63.9 - 53.9 %   MCV 89.6 80.0 - 100.0 fL   MCH 28.5 26.0 - 34.0 pg   MCHC 31.8 30.0 - 36.0 g/dL   RDW 86.4 88.4 - 84.4 %   Platelets 342 150 - 400 K/uL   nRBC 0.0 0.0 - 0.2 %  C Difficile Quick  Screen w PCR reflex     Status: None   Collection Time: 02/13/23  4:15 PM   Specimen: Stool  Result Value Ref Range   C Diff antigen NEGATIVE NEGATIVE   C Diff toxin NEGATIVE NEGATIVE   C Diff interpretation No C. difficile detected.   Lactic acid, plasma     Status: None   Collection Time: 02/13/23  4:15 PM  Result Value Ref Range   Lactic Acid, Venous 1.6 0.5 - 1.9 mmol/L  I-Stat venous blood gas, ([facility] ED, [facility], [facility])     Status: Abnormal   Collection Time: 02/13/23  4:35 PM  Result Value Ref Range   pH, Ven 7.193 (LL) 7.25 - 7.43   pCO2, Ven 31.9 (L) 44 - 60 mmHg   pO2, Ven 27 (LL) 32 - 45 mmHg   Bicarbonate 12.3 (L) 20.0 - 28.0 mmol/L   TCO2 13 (L) 22 - 32 mmol/L   O2 Saturation 38 %   Acid-base deficit 15.0 (H) 0.0 - 2.0 mmol/L   Sodium 136 135 - 145 mmol/L   Potassium 4.0 3.5 - 5.1 mmol/L   Calcium, Ion 1.27 1.15 - 1.40 mmol/L   HCT 35.0 (L) 36.0 - 46.0 %   Hemoglobin 11.9 (L) 12.0 - 15.0 g/dL   Collection site IV start    Drawn by Nurse    Sample type VENOUS    Comment NOTIFIED PHYSICIAN   Lactic acid, plasma     Status: None   Collection Time: 02/13/23  6:35 PM  Result Value  Ref Range   Lactic Acid, Venous 0.9 0.5 - 1.9 mmol/L  Basic metabolic panel     Status: Abnormal   Collection Time: 02/13/23 10:20 PM  Result Value Ref Range   Sodium 137 135 - 145 mmol/L   Potassium 3.4 (L) 3.5 - 5.1 mmol/L   Chloride 114 (H) 98 - 111 mmol/L   CO2 11 (L) 22 - 32 mmol/L   Glucose, Bld 213 (H) 70 - 99 mg/dL   BUN 42 (H) 8 - 23 mg/dL   Creatinine, Ser 7.99 (H) 0.44 - 1.00 mg/dL   Calcium 8.1 (L) 8.9 - 10.3 mg/dL   GFR, Estimated 24 (L) >60 mL/min   Anion gap 12 5 - 15     Assessment and Plan:  Watery, diarrhea -GI consult in a.m. - Continue the Imodium  and twice daily cholestyramine  - Repeat stool study and C. difficile PCR ordered  2.  Type 1 diabetes mellitus on insulin  pump -will continue her pump and allow her to manage her insulin  while hospitalized.  3.  History  of paroxysmal atrial fibrillation -initially her Xarelto  was flagged because of her mild acute renal failure.  Will try to restart that now that her creatinine is improving.  4.  Acute kidney injury/dehydration - continue IV fluids  5.  Hypokalemia -replete     Advance Care Planning:   Code Status: Full Code the patient names her daughter as her surrogate decision-maker and she wants to be full code.  Consults: GI consulted from the ED  Family Communication: none  Severity of Illness: The appropriate patient status for this patient is INPATIENT. Inpatient status is judged to be reasonable and necessary in order to provide the required intensity of service to ensure the patient's safety. The patient's presenting symptoms, physical exam findings, and initial radiographic and laboratory data in the context of their chronic comorbidities is felt to place them at high risk for further clinical deterioration. Furthermore, it is not anticipated that the patient will be medically stable for discharge from the hospital within 2 midnights of admission.   * I certify that at the point of admission it is my clinical judgment that the patient will require inpatient hospital care spanning beyond 2 midnights from the point of admission due to high intensity of service, high risk for further deterioration and high frequency of surveillance required.*  Author: ARTHEA CHILD, MD 02/14/2023 12:55 AM  For on call review [email or web address].

## 2023-02-14 NOTE — Progress Notes (Signed)
 [facility] PLAN OF CARE NOTE Patient: Donna Wiley FMW:[id number]   PCP: Carles Seltzer, MD DOB: May 31, 1940   DOA: 02/13/2023   DOS: 02/14/2023    Patient was admitted by my colleague earlier on 02/14/2023. I have reviewed the H&P as well as assessment and plan and agree with the same. Important changes in the plan are listed below.  Plan of care: Principal Problem:   Diarrhea Patient was recently hospitalized for diarrhea.  Had extensive workup during that hospital stay including colonoscopy and biopsy which was unremarkable. Discharged home in middle of December with 14-day of Lomotil  and Questran  prescription. Started to have recurrent diarrhea again on 1/4.  Most likely after an out of Lomotil . Seen in ER again. And now presents again with worsening diarrhea. Found to have AKI as well as hyponatremia. Also with severe metabolic acidosis. Treated with IV fluids with improvement in sodium level. Metabolic assist was treated with sodium bicarb infusion. GI consulted. Currently back on Questran  as well as Lomotil . Will monitor response. GI pathogen panel as well as C. difficile is negative. Patient never had norovirus diagnosed here in the hospital.  Hyperglycemia. Has type 1 diabetes mellitus and uses insulin  pump. Her insulin  pump malfunction on 1/14. Temporarily was on sliding scale insulin . Had severe hyperglycemia with blood sugar 464 Received subcutaneous insulin . Most likely hyperglycemia was from dextrose  in the bicarb infusion as well as inability to use her home insulin  therapy. Developed mild hypoglycemia which was corrected easily and patient was asymptomatic. Patient is back on her insulin  pump right now. Monitor CBG. Discontinue bicarb drip and switch to oral bicarb.  Level of care: Telemetry Continue for now.  Author: Yetta Blanch, MD  [facility] 02/14/2023 8:09 PM   If 7PM-7AM, please contact night-coverage at [email or web address]

## 2023-02-14 NOTE — Care Management Obs Status (Signed)
 MEDICARE OBSERVATION STATUS NOTIFICATION   Patient Details  Name: Donna Wiley MRN: [id number] Date of Birth: 25-Nov-1940   Medicare Observation Status Notification Given:  Yes    Larrie Kass, LCSW 02/14/2023, 10:15 AM

## 2023-02-14 NOTE — Progress Notes (Signed)
 CBG 66. Given 4 oz ensure. CBG 96.

## 2023-02-14 NOTE — TOC CM/SW Note (Signed)
 Transition of Care [facility]) - Inpatient Brief Assessment   Patient Details  Name: Donna Wiley MRN: [id number] Date of Birth: 05/01/1940  [facility] CM/SW Contact:    Tawni CHRISTELLA Eva, LCSW Phone Number: 02/14/2023, 10:17 AM     Transition of Care Asessment: Insurance and Status: Insurance coverage has been reviewed Patient has primary care physician: Yes Home environment has been reviewed: home with self Prior level of function:: independent Prior/Current Home Services: No current home services Social Drivers of Health Review: SDOH reviewed no interventions necessary Readmission risk has been reviewed: Yes Transition of care needs: no transition of care needs at this time

## 2023-02-14 NOTE — Consult Note (Addendum)
 Consultation  Referring Provider: Dr. Tobie      Primary Care Physician:  Carles Seltzer, MD Primary Gastroenterologist: Sampson here ([facility])-our service saw him within the past 30 days      Reason for Consultation: Continued diarrhea with dehydration         HPI:   Donna Wiley is a 83 y.o. female with a past medical history as listed below including diabetes on insulin  pump, A-fib on anticoagulation, aortic stenosis, GERD, duodenitis, IDA and chronic mesenteric stranding and nodules, who presented to the hospital on 02/13/2023 for diarrhea.    Admission 01/11/2023-01/16/2023 for diarrhea associated with AKI.  At that point discussed starting with diarrhea over the Thanksgiving holiday.  Discussed prior cholecystectomy.  At that admission C. difficile and GI path panel negative.  Underwent colonoscopy negative for microscopic colitis.  Working diagnosis at that time was a possible infectious cause.  Eventually improved on twice daily Lomotil  and twice daily Cholestyramine .    At time of admission on 02/13/2023 patient described 6 weeks of unrelenting watery diarrhea.  Did admit to a couple of days where she did not have diarrhea but it kept coming back.  No appetite.  Discussed that she was seen in the emergency department a little over a week ago and having nausea and vomiting in addition to the diarrhea diagnosed with norovirus and after couple days her nausea and vomiting stopped.  Actually had a few days with no diarrhea but then it came back with a vengeance.    At time of my interview today patient describes that her diarrhea had let up for a little while, but then she started with nausea and vomiting and was diagnosed with norovirus.  After that the diarrhea returned with a vengeance, she counted a total of 25 loose watery stools on Saturday, 02/11/2023.  It is a little hard to decipher exactly what medicine she was taking at home.  She is sure that she was only using her  Cholestyramine  once a day (this was prescribed twice a day) and was also taking a pill, but is uncertain what this was for diarrhea.  (Recall at last admission we had her stabilized on Lomotil  twice daily and Cholestyramine  twice daily) either way it does not sound like she was taking the medications the same way she was during admission.  Denies any new symptoms or complaints, is just growing weary of ongoing diarrhea and weakness.  Continues with decreased appetite.    Denies fever, chills or blood in her stool.  ER course: CTAP with scattered air-fluid levels but no other pathology.  GI history: 01/13/2023 colonoscopy with examined portion of terminal ileum normal, entire colon normal, random biopsies taken and negative for microscopic colitis 07/14/2022 office visit with [facility] gastro for epigastric pain follow-up-at that time following up for annual refill of Pantoprazole , noted to be status post cholecystectomy 03/03/2021 EGD revealed nodular mucosa of the GEJ with biopsy showing reflux esophagitis, negative gastric biopsies, duodenitis but no signs of celiac disease, small hiatal hernia 03/03/2021 colonoscopy with a few mild diverticula and small internal hemorrhoids  Past Medical History:  Diagnosis Date  . Arthritis   . Asthma    childhood  . Atrial fibrillation (HCC)   . Cystocele   . Diabetes mellitus   . GERD (gastroesophageal reflux disease)   . High cholesterol   . Kidney stone   . Rectocele   . SVT (supraventricular tachycardia) (HCC)     Past  Surgical History:  Procedure Laterality Date  . ABDOMINAL HYSTERECTOMY    . APPENDECTOMY    . BIOPSY  01/13/2023   Procedure: BIOPSY;  Surgeon: Legrand Victory LITTIE DOUGLAS, MD;  Location: [facility] ENDOSCOPY;  Service: Gastroenterology;;  . CATARACT EXTRACTION    . CHOLECYSTECTOMY    . COLONOSCOPY WITH PROPOFOL  N/A 01/13/2023   Procedure: COLONOSCOPY WITH PROPOFOL ;  Surgeon: Legrand Victory LITTIE DOUGLAS, MD;  Location: [facility] ENDOSCOPY;   Service: Gastroenterology;  Laterality: N/A;  . FOOT SURGERY    . HAND SURGERY    . ROTATOR CUFF REPAIR      Family History  Problem Relation Age of Onset  . Allergic rhinitis Daughter   . Asthma Daughter   . Lupus Daughter   . Scoliosis Daughter   . Breast cancer Neg Hx   . Urticaria Neg Hx   . Immunodeficiency Neg Hx   . Angioedema Neg Hx   . Atopy Neg Hx   . Eczema Neg Hx      Social History   Tobacco Use  . Smoking status: Never    Passive exposure: Never  . Smokeless tobacco: Never  Vaping Use  . Vaping status: Never Used  Substance Use Topics  . Alcohol use: No  . Drug use: No   Prior to Admission medications   Medication Sig Start Date End Date Taking? Authorizing Provider  acetaminophen  (TYLENOL ) 500 MG tablet Take 1,000 mg by mouth every 6 (six) hours as needed for mild pain (pain score 1-3) or moderate pain (pain score 4-6).   Yes [provider]  b complex vitamins capsule Take 1 capsule by mouth every evening.   Yes [provider]  calcium citrate (CALCITRATE - DOSED IN MG ELEMENTAL CALCIUM) 950 (200 Ca) MG tablet Take 500 mg of elemental calcium by mouth 2 (two) times daily.   Yes [provider]  cholestyramine  (QUESTRAN ) 4 g packet Take 1 packet (4 g total) by mouth 2 (two) times daily for 14 days. Patient taking differently: Take 4 g by mouth daily. 01/17/23 02/14/23 Yes Dezii, Alexandra, DO  clobetasol  cream (TEMOVATE ) 0.05 % Apply 1 Application topically 2 (two) times daily as needed (Dermatitis). 09/15/21  Yes [provider]  diltiazem  (CARDIZEM  CD) 180 MG 24 hr capsule Take 180 mg by mouth at bedtime.   Yes [provider]  EPINEPHrine  0.3 mg/0.3 mL IJ SOAJ injection Inject 0.3 mg into the muscle as needed for anaphylaxis. 06/21/21  Yes [provider]  flecainide  (TAMBOCOR ) 50 MG tablet Take 50-100 mg by mouth See admin instructions. Take 50mg  (1 tablet) by mouth every morning and 100mg  (2 tablets)  every evening.   Yes [provider]  insulin  lispro (HUMALOG) 100 UNIT/ML injection Inject 0-15 Units into the skin 4 (four) times daily. Per pump   Yes [provider]  LORazepam  (ATIVAN ) 0.5 MG tablet Take 0.5 mg by mouth at bedtime. 12/10/22  Yes [provider]  mirabegron ER (MYRBETRIQ) 50 MG TB24 tablet Take 50 mg by mouth daily. 07/13/21 02/14/23 Yes [provider]  olmesartan-hydrochlorothiazide (BENICAR HCT) 40-12.5 MG tablet Take 1 tablet by mouth in the morning. 02/17/21  Yes [provider]  ondansetron  (ZOFRAN ) 4 MG tablet Take 1 tablet (4 mg total) by mouth every 6 (six) hours. Patient taking differently: Take 4 mg by mouth every 6 (six) hours as needed for nausea or vomiting. 02/04/23  Yes Mannie Pac T, DO  rivaroxaban  (XARELTO ) 20 MG TABS tablet Take 20 mg  by mouth every evening.   Yes [provider]  simvastatin  (ZOCOR ) 20 MG tablet Take 10 mg by mouth at bedtime.   Yes [provider]  pantoprazole  (PROTONIX ) 40 MG tablet Take 40 mg by mouth daily. Patient not taking: Reported on 02/14/2023    [provider]    Current Facility-Administered Medications  Medication Dose Route Frequency Provider Last Rate Last Admin  . acetaminophen  (TYLENOL ) tablet 650 mg  650 mg Oral Q6H PRN Arthea Child, MD       Or  . acetaminophen  (TYLENOL ) suppository 650 mg  650 mg Rectal Q6H PRN Arthea Child, MD      . cholestyramine  light (PREVALITE ) packet 4 g  4 g Oral Q12H Arthea Child, MD      . flecainide  (TAMBOCOR ) tablet 50 mg  50 mg Oral Daily Claiborne, Eldena, MD   50 mg at 02/14/23 0900   And  . flecainide  (TAMBOCOR ) tablet 100 mg  100 mg Oral QHS Claiborne, Lylian, MD   100 mg at 02/13/23 2347  . LORazepam  (ATIVAN ) tablet 1 mg  1 mg Oral QHS Claiborne, Vanissa, MD   1 mg at 02/13/23 2348  . magnesium  sulfate IVPB 2 g 50 mL  2 g Intravenous Once Patel, Pranav M, MD 50 mL/hr at 02/14/23 0904 2 g at  02/14/23 0904  . ondansetron  (ZOFRAN ) tablet 4 mg  4 mg Oral Q6H PRN Arthea Child, MD       Or  . ondansetron  (ZOFRAN ) injection 4 mg  4 mg Intravenous Q6H PRN Arthea Child, MD      . Oral care mouth rinse  15 mL Mouth Rinse PRN Claiborne, Sritha, MD      . sodium bicarbonate  150 mEq in dextrose  5 % 1,150 mL infusion   Intravenous Continuous Dreama Longs, MD 100 mL/hr at 02/14/23 0555 New Bag at 02/14/23 0555    Allergies as of 02/13/2023 - Review Complete 02/13/2023  Allergen Reaction Noted  . Iodine Anaphylaxis 07/05/2021  . Nickel Rash 12/30/2013  . Charentais melon (french melon)  09/16/2015  . Cherry Swelling 09/16/2015  . Codeine  11/08/2010  . Ivp dye [iodinated contrast media]  11/08/2010  . Other  11/08/2010  . Shellfish allergy   08/11/2014  . Sulfa antibiotics  11/08/2010  . Tape Dermatitis 07/02/2013     Review of Systems:    Constitutional: No weight loss, fever or chills Skin: No rash Cardiovascular: No chest pain  Respiratory: No SOB  Gastrointestinal: See HPI and otherwise negative Genitourinary: No dysuria Neurological: No headache, dizziness or syncope Musculoskeletal: No new muscle or joint pain Hematologic: No bleeding Psychiatric: No history of depression or anxiety    Physical Exam:  Vital signs in last 24 hours: Temp:  [97.8 F (36.6 C)-98.6 F (37 C)] 98.6 F (37 C) (01/14 0944) Pulse Rate:  [84-96] 90 (01/14 0944) Resp:  [16-18] 16 (01/14 0944) BP: (90-131)/(41-64) 126/54 (01/14 0944) SpO2:  [97 %-100 %] 97 % (01/14 0944) Weight:  [54.8 kg] 54.8 kg (01/13 2219) Last BM Date : 02/13/23 General:   Pleasant elderly Caucasian female appears to be in NAD, Well developed, Well nourished, alert and cooperative Head:  Normocephalic and atraumatic. Eyes:   PEERL, EOMI. No icterus. Conjunctiva pink. Ears:  Normal auditory acuity. Neck:  Supple Throat: Oral cavity and pharynx without inflammation, swelling or lesion. Teeth in good  condition. Lungs: Respirations even and unlabored. Lungs clear to auscultation bilaterally.   No wheezes, crackles, or rhonchi.  Heart: Normal  S1, S2. No MRG. Regular rate and rhythm. No peripheral edema, cyanosis or pallor.  Abdomen:  Soft, nondistended, nontender. No rebound or guarding. Normal bowel sounds. No appreciable masses or hepatomegaly. Rectal:  Not performed.  Msk:  Symmetrical without gross deformities. Peripheral pulses intact.  Extremities:  Without edema, no deformity or joint abnormality. Normal ROM, normal sensation. Neurologic:  Alert and  oriented x4;  grossly normal neurologically.  Skin:   Dry and intact without significant lesions or rashes. Psychiatric: Demonstrates good judgement and reason without abnormal affect or behaviors.  LAB RESULTS: Recent Labs    02/13/23 1506 02/13/23 1635 02/14/23 0415  WBC 7.1  --  3.8*  HGB 11.2* 11.9* 9.3*  HCT 35.2* 35.0* 28.6*  PLT 342  --  253   BMET Recent Labs    02/13/23 1506 02/13/23 1635 02/13/23 2220 02/14/23 0415  NA 128* 136 137 135  K 3.9 4.0 3.4* 3.5  CL 103  --  114* 111  CO2 10*  --  11* 14*  GLUCOSE 306*  --  213* 210*  BUN 46*  --  42* 37*  CREATININE 2.79*  --  2.00* 1.55*  CALCIUM 8.4*  --  8.1* 7.8*   LFT Recent Labs    02/13/23 1506  PROT 6.1*  ALBUMIN 3.4*  AST 21  ALT 21  ALKPHOS 54  BILITOT 0.7   STUDIES: CT ABDOMEN PELVIS WO CONTRAST Result Date: 02/13/2023 CLINICAL DATA:  Recurrent and persistent diarrhea EXAM: CT ABDOMEN AND PELVIS WITHOUT CONTRAST TECHNIQUE: Multidetector CT imaging of the abdomen and pelvis was performed following the standard protocol without IV contrast. RADIATION DOSE REDUCTION: This exam was performed according to the departmental dose-optimization program which includes automated exposure control, adjustment of the mA and/or kV according to patient size and/or use of iterative reconstruction technique. COMPARISON:  02/04/2023 FINDINGS: Lower chest: No acute  abnormality. Hepatobiliary: No focal liver abnormality is seen. Status post cholecystectomy. No biliary dilatation. Pancreas: No focal abnormality or ductal dilatation. Spleen: No focal abnormality.  Normal size. Adrenals/Urinary Tract: No adrenal abnormality. No focal renal abnormality. No stones or hydronephrosis. Urinary bladder is unremarkable. Stomach/Bowel: Fluid throughout the large intestine may be related to diarrhea. No bowel wall thickening or evidence of obstruction. Vascular/Lymphatic: Aortoiliac atherosclerosis. No evidence of aneurysm or adenopathy. Reproductive: Prior hysterectomy.  No adnexal masses. Other: No free fluid or free air. Musculoskeletal: No acute bony abnormality. IMPRESSION: Fluid with scattered air-fluid levels throughout the colon compatible with given history of diarrhea. No evidence of bowel inflammation, wall thickening or bowel obstruction. Electronically Signed   By: Franky Crease M.D.   On: 02/13/2023 17:50    Impression / Plan:   Impression: 1.  Recurrent diarrhea: Recent admission for the same with workup including negative stool studies and normal colonoscopy with normal random biopsies, thought to be related to postinfectious process/postviral process, eventually symptoms abated with Cholestyramine  twice daily and Lomotil  twice daily, patient has not been on the same medication regimen since discharge and unfortunately landed back in the ER a week ago with a norovirus, now here again with recurrent diarrhea, repeat C. difficile and GI path panel yesterday negative; likely continued postviral/infectious diarrhea +/- bile bile salt induced contributing 2.  Type 1 diabetes on insulin  pump 3.  Paroxysmal A-fib 4.  AKI/dehydration: Related to diarrhea 5.  Hypokalemia: Related diarrhea  Plan: 1.  No need for repeat endoscopic workup at this point with recent colonoscopy during last admission for the same symptoms. 2.  Reviewed negative GI path panel and C. difficile  this admission. 3.  Patient previously restarted on her regimen from last admission by the hospitalist team, though Lomotil  is ordered 4 times daily at the moment in addition to her Cholestyramine  twice daily.  May need to decrease Lomotil  in the near future to twice daily dosing. 5.  Continue regular diet.  Thank you for your kind consultation, we will continue to follow.  Delon Gibson Jahira Swiss  02/14/2023, 9:45 AM

## 2023-02-15 DIAGNOSIS — K529 Noninfective gastroenteritis and colitis, unspecified: Secondary | ICD-10-CM | POA: Diagnosis not present

## 2023-02-15 DIAGNOSIS — E8721 Acute metabolic acidosis: Secondary | ICD-10-CM

## 2023-02-15 DIAGNOSIS — R197 Diarrhea, unspecified: Secondary | ICD-10-CM | POA: Diagnosis not present

## 2023-02-15 DIAGNOSIS — E860 Dehydration: Principal | ICD-10-CM | POA: Insufficient documentation

## 2023-02-15 DIAGNOSIS — Z9049 Acquired absence of other specified parts of digestive tract: Secondary | ICD-10-CM | POA: Insufficient documentation

## 2023-02-15 DIAGNOSIS — N179 Acute kidney failure, unspecified: Secondary | ICD-10-CM | POA: Diagnosis not present

## 2023-02-15 LAB — PHOSPHORUS: Phosphorus: 2.8 mg/dL (ref 2.5–4.6)

## 2023-02-15 LAB — COMPREHENSIVE METABOLIC PANEL
ALT: 19 U/L (ref 0–44)
AST: 21 U/L (ref 15–41)
Albumin: 3.1 g/dL — ABNORMAL LOW (ref 3.5–5.0)
Alkaline Phosphatase: 46 U/L (ref 38–126)
Anion gap: 9 (ref 5–15)
BUN: 27 mg/dL — ABNORMAL HIGH (ref 8–23)
CO2: 15 mmol/L — ABNORMAL LOW (ref 22–32)
Calcium: 8.3 mg/dL — ABNORMAL LOW (ref 8.9–10.3)
Chloride: 113 mmol/L — ABNORMAL HIGH (ref 98–111)
Creatinine, Ser: 1.01 mg/dL — ABNORMAL HIGH (ref 0.44–1.00)
GFR, Estimated: 55 mL/min — ABNORMAL LOW (ref >60)
Glucose, Bld: 279 mg/dL — ABNORMAL HIGH (ref 70–99)
Potassium: 3.6 mmol/L (ref 3.5–5.1)
Sodium: 137 mmol/L (ref 135–145)
Total Bilirubin: 0.7 mg/dL (ref 0.0–1.2)
Total Protein: 5.4 g/dL — ABNORMAL LOW (ref 6.5–8.1)

## 2023-02-15 LAB — CBC WITH DIFFERENTIAL/PLATELET
Abs Immature Granulocytes: 0.05 10*3/uL (ref 0.00–0.07)
Basophils Absolute: 0 10*3/uL (ref 0.0–0.1)
Basophils Relative: 1 %
Eosinophils Absolute: 0.1 10*3/uL (ref 0.0–0.5)
Eosinophils Relative: 2 %
HCT: 29.4 % — ABNORMAL LOW (ref 36.0–46.0)
Hemoglobin: 9.5 g/dL — ABNORMAL LOW (ref 12.0–15.0)
Immature Granulocytes: 1 %
Lymphocytes Relative: 26 %
Lymphs Abs: 1.3 10*3/uL (ref 0.7–4.0)
MCH: 28.7 pg (ref 26.0–34.0)
MCHC: 32.3 g/dL (ref 30.0–36.0)
MCV: 88.8 fL (ref 80.0–100.0)
Monocytes Absolute: 0.5 10*3/uL (ref 0.1–1.0)
Monocytes Relative: 11 %
Neutro Abs: 2.9 10*3/uL (ref 1.7–7.7)
Neutrophils Relative %: 59 %
Platelets: 268 10*3/uL (ref 150–400)
RBC: 3.31 MIL/uL — ABNORMAL LOW (ref 3.87–5.11)
RDW: 13.9 % (ref 11.5–15.5)
WBC: 4.8 10*3/uL (ref 4.0–10.5)
nRBC: 0 % (ref 0.0–0.2)

## 2023-02-15 LAB — GLUCOSE, CAPILLARY
Glucose-Capillary: 209 mg/dL — ABNORMAL HIGH (ref 70–99)
Glucose-Capillary: 287 mg/dL — ABNORMAL HIGH (ref 70–99)
Glucose-Capillary: 214 mg/dL — ABNORMAL HIGH (ref 70–99)
Glucose-Capillary: 73 mg/dL (ref 70–99)
Glucose-Capillary: 172 mg/dL — ABNORMAL HIGH (ref 70–99)

## 2023-02-15 LAB — HEMOGLOBIN A1C
Hgb A1c MFr Bld: 7.7 % — ABNORMAL HIGH (ref 4.8–5.6)
Mean Plasma Glucose: 174.29 mg/dL

## 2023-02-15 LAB — TSH: TSH: 1.264 u[IU]/mL (ref 0.350–4.500)

## 2023-02-15 LAB — MAGNESIUM: Magnesium: 1.9 mg/dL (ref 1.7–2.4)

## 2023-02-15 LAB — T4, FREE: Free T4: 0.98 ng/dL (ref 0.61–1.12)

## 2023-02-15 MED ORDER — INSULIN ASPART 100 UNIT/ML IJ SOLN
0.0000 [IU] | Freq: Three times a day (TID) | INTRAMUSCULAR | Status: DC
Start: 2023-02-15 — End: 2023-02-15
  Administered 2023-02-15: 9 [IU] via SUBCUTANEOUS
  Administered 2023-02-15: 3 [IU] via SUBCUTANEOUS

## 2023-02-15 MED ORDER — CHOLESTYRAMINE LIGHT 4 G PO PACK
4.0000 g | PACK | Freq: Three times a day (TID) | ORAL | Status: DC
  Administered 2023-02-15 – 2023-02-23 (×18): 4 g via ORAL
  Filled 2023-02-15 (×17): qty 1

## 2023-02-15 MED ORDER — RIVAROXABAN 15 MG PO TABS
15.0000 mg | ORAL_TABLET | Freq: Every day | ORAL | Status: DC
  Administered 2023-02-15 – 2023-02-20 (×6): 15 mg via ORAL
  Filled 2023-02-15 (×7): qty 1

## 2023-02-15 NOTE — Progress Notes (Signed)
 Verbal order from attending Dr. Aldona Amel to discontinue sliding scale orders once patients insulin  pump is back on and working. At this time patient has just began the process of placing her new insulin  pump, in which the device takes time to sync with her Dexcom. Night shift RN made aware of verbal order to d/c sliding scale orders and use insulin  pump orders when appropriate.

## 2023-02-15 NOTE — Plan of Care (Signed)
   Problem: Education: Goal: Knowledge of General Education information will improve Description Including pain rating scale, medication(s)/side effects and non-pharmacologic comfort measures Outcome: Progressing   Problem: Health Behavior/Discharge Planning: Goal: Ability to manage health-related needs will improve Outcome: Progressing

## 2023-02-15 NOTE — Progress Notes (Signed)
 Mobility Specialist - Progress Note   02/15/23 1348  Mobility  Activity Ambulated with assistance in hallway  Level of Assistance Independent after set-up  Assistive Device Other (Comment) (IV Pole)  Distance Ambulated (ft) 350 ft  Activity Response Tolerated well  Mobility Referral Yes  Mobility visit 1 Mobility  Mobility Specialist Start Time (ACUTE ONLY) 1337  Mobility Specialist Stop Time (ACUTE ONLY) 1345  Mobility Specialist Time Calculation (min) (ACUTE ONLY) 8 min   Pt received in bed and agreeable to mobility. No complaints during session. Pt to bed after session with all needs met.    [facility]

## 2023-02-15 NOTE — Progress Notes (Signed)
 Pt insulin  pump back and working. D/c sliding scale orders.

## 2023-02-15 NOTE — Progress Notes (Signed)
 Progress Note   Subjective  Day #2 Chief Complaint: Diarrhea and dehydration  Patient describes that she had a rough night, 2-3 urgent bowel movements and 1 where she did not make it out of the bed, for already this morning before 8:00 AM.    Objective   Vital signs in last 24 hours: Temp:  [98.4 F (36.9 C)-99.1 F (37.3 C)] 98.4 F (36.9 C) (01/15 0503) Pulse Rate:  [73-108] 73 (01/15 0503) Resp:  [17-18] 18 (01/15 0503) BP: (118-127)/(51-64) 118/51 (01/15 0503) SpO2:  [98 %-99 %] 99 % (01/15 0503) Last BM Date : 02/13/23 General:    Elderly white female in NAD Heart:  Regular rate and rhythm; no murmurs Lungs: Respirations even and unlabored, lungs CTA bilaterally Abdomen:  Soft, nontender and nondistended. Normal bowel sounds. Psych:  Cooperative. Normal mood and affect.  Intake/Output from previous day: 01/14 0701 - 01/15 0700 In: 1433.1 [P.O.:240; I.V.:1144; IV Piggyback:49.2] Out: -  Intake/Output this shift: Total I/O In: 220 [P.O.:220] Out: -   Lab Results: Recent Labs    02/13/23 1506 02/13/23 1635 02/14/23 0415 02/15/23 0421  WBC 7.1  --  3.8* 4.8  HGB 11.2* 11.9* 9.3* 9.5*  HCT 35.2* 35.0* 28.6* 29.4*  PLT 342  --  253 268   BMET Recent Labs    02/13/23 2220 02/14/23 0415 02/14/23 1141 02/15/23 0421  NA 137 135  --  137  K 3.4* 3.5  --  3.6  CL 114* 111  --  113*  CO2 11* 14*  --  15*  GLUCOSE 213* 210* 464* 279*  BUN 42* 37*  --  27*  CREATININE 2.00* 1.55*  --  1.01*  CALCIUM 8.1* 7.8*  --  8.3*   LFT Recent Labs    02/15/23 0421  PROT 5.4*  ALBUMIN 3.1*  AST 21  ALT 19  ALKPHOS 46  BILITOT 0.7   Studies/Results: CT ABDOMEN PELVIS WO CONTRAST Result Date: 02/13/2023 CLINICAL DATA:  Recurrent and persistent diarrhea EXAM: CT ABDOMEN AND PELVIS WITHOUT CONTRAST TECHNIQUE: Multidetector CT imaging of the abdomen and pelvis was performed following the standard protocol without IV contrast. RADIATION DOSE REDUCTION: This exam  was performed according to the departmental dose-optimization program which includes automated exposure control, adjustment of the mA and/or kV according to patient size and/or use of iterative reconstruction technique. COMPARISON:  02/04/2023 FINDINGS: Lower chest: No acute abnormality. Hepatobiliary: No focal liver abnormality is seen. Status post cholecystectomy. No biliary dilatation. Pancreas: No focal abnormality or ductal dilatation. Spleen: No focal abnormality.  Normal size. Adrenals/Urinary Tract: No adrenal abnormality. No focal renal abnormality. No stones or hydronephrosis. Urinary bladder is unremarkable. Stomach/Bowel: Fluid throughout the large intestine may be related to diarrhea. No bowel wall thickening or evidence of obstruction. Vascular/Lymphatic: Aortoiliac atherosclerosis. No evidence of aneurysm or adenopathy. Reproductive: Prior hysterectomy.  No adnexal masses. Other: No free fluid or free air. Musculoskeletal: No acute bony abnormality. IMPRESSION: Fluid with scattered air-fluid levels throughout the colon compatible with given history of diarrhea. No evidence of bowel inflammation, wall thickening or bowel obstruction. Electronically Signed   By: Janeece Mechanic M.D.   On: 02/13/2023 17:50    Assessment / Plan:   Assessment: 1.  Recurrent diarrhea: Recent admission for the same with workup negative including negative stool studies normal colonoscopy with normal random biopsies, thought related to postinfectious process/postviral process, eventually symptoms abated with Cholestyramine  twice daily and Lomotil  twice daily, patient returned again yesterday 02/14/2023 with the same  symptoms and signs of dehydration, repeat C. difficile and GI path panel negative, currently fecal calprotectin and fecal pancreatic elastase are pending as well as celiac studies, TSH also reordered by the hospitalist team; unclear origin 2.  Type 1 diabetes on insulin  pump 3.  Paroxysmal A-fib 4.   AKI/dehydration: Related to diarrhea 5.  Hypokalemia: Related diarrhea  Plan: 1.  No repeat endoscopic workup planned at this point.  I believe pharmacy team is okaying patient to be restarted on a lower dose of Xarelto  given kidney function. 2.  Fecal calprotectin and fecal pancreatic elastase pending from yesterday as well as celiac testing 3.  Continue Lomotil  4 times daily and Cholestyramine  twice daily for now 4.  Again could consider SIBO as well/empiric Xifaxan  therapy 5.  Continue current supportive measures  Thank you for your kind consultation, we will continue to follow along   LOS: 0 days   Graciella Lavender  02/15/2023, 11:30 AM

## 2023-02-15 NOTE — Progress Notes (Signed)
 PROGRESS NOTE  Faithful Bergen ZOX:[id number] DOB: 07-Mar-1940 DOA: 02/13/2023 PCP: Crist Dominion, MD   LOS: 0 days   Brief Narrative / Interim history: 83 year old female with history of DM1 on insulin  pump, PAF on Xarelto , aortic stenosis comes into the hospital with several weeks of watery diarrhea.  She was hospitalized December 2020 for 4 profuse diarrhea, underwent GI workup with colonoscopy, infectious workup, all negative.  She was placed on loperamide  as well as cholestyramine , improved and discharged home.  As she finished taking the medications, diarrhea returned and decided come back to the hospital.  Subjective / 24h Interval events: Continues to have significant diarrhea, at least 4 episodes just this morning from waking up and up until breakfast  Assesement and Plan: Principal problem Subacute diarrheal illness -patient hospitalized in December with similar symptoms, underwent a colonoscopy on 12/13 which showed normal terminal ileum, normal colon, random biopsies taken and was negative for microscopic colitis -Recent workup was negative for stool studies -Symptoms improved with cholestyramine  as well as Lomotil , but as soon as she finished this diarrhea returned -Etiology not clear, GI consulted, appreciate input.  For now supportive care with cholestyramine , Lomotil   Active problems Diabetes mellitus, type I-continue insulin  pump  PAF-continue flecainide , Xarelto   Acute kidney injury non, anion gap metabolic acidosis-creatinine 2.8 on admission, improved to 1.0 with fluids.  Hypokalemia-replace as indicated, magnesium  normal.  Continue to monitor  Mild normocytic anemia-stable, no bleeding  Scheduled Meds:  cholestyramine  light  4 g Oral Q12H   diphenoxylate -atropine   1 tablet Oral QID   feeding supplement  237 mL Oral TID BM   flecainide   50 mg Oral Daily   And   flecainide   100 mg Oral QHS   insulin  aspart  0-9 Units Subcutaneous TID WC   insulin  pump    Subcutaneous TID WC, HS, 0200   LORazepam   1 mg Oral QHS   rivaroxaban   15 mg Oral Q supper   sodium bicarbonate   650 mg Oral TID   Continuous Infusions:  sodium chloride  100 mL/hr at 02/14/23 2348   PRN Meds:.acetaminophen  **OR** acetaminophen , ondansetron  **OR** ondansetron  (ZOFRAN ) IV, mouth rinse  Current Outpatient Medications  Medication Instructions   acetaminophen  (TYLENOL ) 1,000 mg, Oral, Every 6 hours PRN   b complex vitamins capsule 1 capsule, Every evening   calcium citrate (CALCITRATE - DOSED IN MG ELEMENTAL CALCIUM) 950 (200 Ca) MG tablet 500 mg of elemental calcium, 2 times daily   cholestyramine  (QUESTRAN ) 4 g, Oral, 2 times daily   clobetasol  cream (TEMOVATE ) 0.05 % 1 Application, 2 times daily PRN   diltiazem  (CARDIZEM  CD) 180 mg, Nightly   EPINEPHrine  (EPI-PEN) 0.3 mg, As needed   flecainide  (TAMBOCOR ) 50-100 mg, See admin instructions   insulin  lispro (HUMALOG) 0-15 Units, 4 times daily   LORazepam  (ATIVAN ) 0.5 mg, Nightly   mirabegron ER (MYRBETRIQ) 50 mg, Daily   [Paused] olmesartan-hydrochlorothiazide (BENICAR HCT) 40-12.5 MG tablet 1 tablet, Every morning   ondansetron  (ZOFRAN ) 4 mg, Oral, Every 6 hours   pantoprazole  (PROTONIX ) 40 mg, Daily   rivaroxaban  (XARELTO ) 20 mg, Every evening   simvastatin  (ZOCOR ) 10 mg, Daily at bedtime    Diet Orders (From admission, onward)     Start     Ordered   02/13/23 2312  Diet Carb Modified Fluid consistency: Thin; Room service appropriate? Yes  Diet effective now       Question Answer Comment  Diet-HS Snack? Snack-yes   Calorie Level Medium 1600-2000   Fluid consistency:  Thin   Room service appropriate? Yes      02/13/23 2311            DVT prophylaxis: Place and maintain sequential compression device Start: 02/14/23 1119 Rivaroxaban  (XARELTO ) tablet 15 mg   Lab Results  Component Value Date   PLT 268 02/15/2023      Code Status: Full Code  Family Communication: No family at bedside  Status is:  Observation The patient will require care spanning > 2 midnights and should be moved to inpatient because: Persistent diarrhea  Level of care: Telemetry  Consultants:  GI  Objective: Vitals:   02/14/23 0944 02/14/23 1235 02/15/23 0503 02/15/23 1145  BP: (!) 126/54 127/64 (!) 118/51 127/61  Pulse: 90 (!) 108 73 84  Resp: 16 17 18 17   Temp: 98.6 F (37 C) 99.1 F (37.3 C) 98.4 F (36.9 C) 98 F (36.7 C)  TempSrc: Oral Oral Oral Oral  SpO2: 97% 98% 99% 98%  Weight:      Height:        Intake/Output Summary (Last 24 hours) at 02/15/2023 1202 Last data filed at 02/15/2023 0910 Gross per 24 hour  Intake 878.08 ml  Output --  Net 878.08 ml   Wt Readings from Last 3 Encounters:  02/13/23 54.8 kg  02/04/23 61.2 kg  01/17/23 59.3 kg    Examination:  Constitutional: NAD Eyes: no scleral icterus ENMT: Mucous membranes are moist.  Neck: normal, supple Respiratory: clear to auscultation bilaterally, no wheezing, no crackles. Normal respiratory effort. No accessory muscle use.  Cardiovascular: Regular rate and rhythm, no murmurs / rubs / gallops. No LE edema.  Abdomen: non distended, no tenderness. Bowel sounds positive.  Musculoskeletal: no clubbing / cyanosis.   Data Reviewed: I have independently reviewed following labs and imaging studies   CBC Recent Labs  Lab 02/13/23 1506 02/13/23 1635 02/14/23 0415 02/15/23 0421  WBC 7.1  --  3.8* 4.8  HGB 11.2* 11.9* 9.3* 9.5*  HCT 35.2* 35.0* 28.6* 29.4*  PLT 342  --  253 268  MCV 89.6  --  87.5 88.8  MCH 28.5  --  28.4 28.7  MCHC 31.8  --  32.5 32.3  RDW 13.5  --  13.5 13.9  LYMPHSABS  --   --   --  1.3  MONOABS  --   --   --  0.5  EOSABS  --   --   --  0.1  BASOSABS  --   --   --  0.0    Recent Labs  Lab 02/13/23 1506 02/13/23 1615 02/13/23 1635 02/13/23 1835 02/13/23 2220 02/14/23 0415 02/14/23 1141 02/15/23 0421  NA 128*  --  136  --  137 135  --  137  K 3.9  --  4.0  --  3.4* 3.5  --  3.6  CL 103  --    --   --  114* 111  --  113*  CO2 10*  --   --   --  11* 14*  --  15*  GLUCOSE 306*  --   --   --  213* 210* 464* 279*  BUN 46*  --   --   --  42* 37*  --  27*  CREATININE 2.79*  --   --   --  2.00* 1.55*  --  1.01*  CALCIUM 8.4*  --   --   --  8.1* 7.8*  --  8.3*  AST 21  --   --   --   --   --   --  21  ALT 21  --   --   --   --   --   --  19  ALKPHOS 54  --   --   --   --   --   --  46  BILITOT 0.7  --   --   --   --   --   --  0.7  ALBUMIN 3.4*  --   --   --   --   --   --  3.1*  MG  --   --   --   --   --  1.6*  --  1.9  LATICACIDVEN  --  1.6  --  0.9  --   --   --   --   TSH  --   --   --   --   --   --   --  1.264  HGBA1C  --   --   --   --   --   --   --  7.7*    ------------------------------------------------------------------------------------------------------------------ No results for input(s): "CHOL", "HDL", "LDLCALC", "TRIG", "CHOLHDL", "LDLDIRECT" in the last 72 hours.  Lab Results  Component Value Date   HGBA1C 7.7 (H) 02/15/2023   ------------------------------------------------------------------------------------------------------------------ Recent Labs    02/15/23 0421  TSH 1.264    Cardiac Enzymes No results for input(s): "CKMB", "TROPONINI", "MYOGLOBIN" in the last 168 hours.  Invalid input(s): "CK" ------------------------------------------------------------------------------------------------------------------ No results found for: "BNP"  CBG: Recent Labs  Lab 02/14/23 2232 02/14/23 2353 02/15/23 0221 02/15/23 0741 02/15/23 1143  GLUCAP 327* 306* 209* 287* 214*    Recent Results (from the past 240 hours)  Gastrointestinal Panel by PCR , Stool     Status: None   Collection Time: 02/13/23  4:14 PM   Specimen: Stool  Result Value Ref Range Status   Campylobacter species NOT DETECTED NOT DETECTED Final   Plesimonas shigelloides NOT DETECTED NOT DETECTED Final   Salmonella species NOT DETECTED NOT DETECTED Final   Yersinia enterocolitica  NOT DETECTED NOT DETECTED Final   Vibrio species NOT DETECTED NOT DETECTED Final   Vibrio cholerae NOT DETECTED NOT DETECTED Final   Enteroaggregative E coli (EAEC) NOT DETECTED NOT DETECTED Final   Enteropathogenic E coli (EPEC) NOT DETECTED NOT DETECTED Final   Enterotoxigenic E coli (ETEC) NOT DETECTED NOT DETECTED Final   Shiga like toxin producing E coli (STEC) NOT DETECTED NOT DETECTED Final   Shigella/Enteroinvasive E coli (EIEC) NOT DETECTED NOT DETECTED Final   Cryptosporidium NOT DETECTED NOT DETECTED Final   Cyclospora cayetanensis NOT DETECTED NOT DETECTED Final   Entamoeba histolytica NOT DETECTED NOT DETECTED Final   Giardia lamblia NOT DETECTED NOT DETECTED Final   Adenovirus F40/41 NOT DETECTED NOT DETECTED Final   Astrovirus NOT DETECTED NOT DETECTED Final   Norovirus GI/GII NOT DETECTED NOT DETECTED Final   Rotavirus A NOT DETECTED NOT DETECTED Final   Sapovirus (I, II, IV, and V) NOT DETECTED NOT DETECTED Final    Comment: Performed at [facility], [address] [place], [place] [address]  C Difficile Quick Screen w PCR reflex     Status: None   Collection Time: 02/13/23  4:15 PM   Specimen: Stool  Result Value Ref Range Status   C Diff antigen NEGATIVE NEGATIVE Final   C Diff toxin NEGATIVE NEGATIVE Final   C Diff interpretation No C. difficile detected.  Final    Comment: Performed at [facility], [address], [place], [place] [address]     Radiology Studies: No results found.   Kathlen Para, MD, PhD [facility]  Between 7 am - 7 pm I am available, please contact me via Amion (for emergencies) or Securechat (non urgent messages)  Between 7 pm - 7 am I am not available, please contact night coverage MD/APP via Amion

## 2023-02-15 NOTE — Plan of Care (Signed)

## 2023-02-16 DIAGNOSIS — E8721 Acute metabolic acidosis: Secondary | ICD-10-CM

## 2023-02-16 DIAGNOSIS — K2970 Gastritis, unspecified, without bleeding: Secondary | ICD-10-CM | POA: Diagnosis present

## 2023-02-16 DIAGNOSIS — Z87442 Personal history of urinary calculi: Secondary | ICD-10-CM | POA: Diagnosis not present

## 2023-02-16 DIAGNOSIS — R195 Other fecal abnormalities: Secondary | ICD-10-CM | POA: Diagnosis not present

## 2023-02-16 DIAGNOSIS — Z9049 Acquired absence of other specified parts of digestive tract: Secondary | ICD-10-CM

## 2023-02-16 DIAGNOSIS — K2980 Duodenitis without bleeding: Secondary | ICD-10-CM | POA: Diagnosis present

## 2023-02-16 DIAGNOSIS — R197 Diarrhea, unspecified: Secondary | ICD-10-CM | POA: Diagnosis present

## 2023-02-16 DIAGNOSIS — E860 Dehydration: Secondary | ICD-10-CM

## 2023-02-16 DIAGNOSIS — Z794 Long term (current) use of insulin: Secondary | ICD-10-CM | POA: Diagnosis not present

## 2023-02-16 DIAGNOSIS — J45909 Unspecified asthma, uncomplicated: Secondary | ICD-10-CM | POA: Diagnosis not present

## 2023-02-16 DIAGNOSIS — Y742 Prosthetic and other implants, materials and accessory general hospital and personal-use devices associated with adverse incidents: Secondary | ICD-10-CM | POA: Diagnosis not present

## 2023-02-16 DIAGNOSIS — E871 Hypo-osmolality and hyponatremia: Secondary | ICD-10-CM | POA: Diagnosis present

## 2023-02-16 DIAGNOSIS — K638219 Small intestinal bacterial overgrowth, unspecified: Secondary | ICD-10-CM | POA: Diagnosis present

## 2023-02-16 DIAGNOSIS — K529 Noninfective gastroenteritis and colitis, unspecified: Secondary | ICD-10-CM | POA: Diagnosis present

## 2023-02-16 DIAGNOSIS — N179 Acute kidney failure, unspecified: Secondary | ICD-10-CM | POA: Diagnosis present

## 2023-02-16 DIAGNOSIS — I4819 Other persistent atrial fibrillation: Secondary | ICD-10-CM | POA: Diagnosis present

## 2023-02-16 DIAGNOSIS — D509 Iron deficiency anemia, unspecified: Secondary | ICD-10-CM | POA: Diagnosis present

## 2023-02-16 DIAGNOSIS — K219 Gastro-esophageal reflux disease without esophagitis: Secondary | ICD-10-CM | POA: Diagnosis present

## 2023-02-16 DIAGNOSIS — E7800 Pure hypercholesterolemia, unspecified: Secondary | ICD-10-CM | POA: Diagnosis present

## 2023-02-16 DIAGNOSIS — T85614A Breakdown (mechanical) of insulin pump, initial encounter: Secondary | ICD-10-CM | POA: Diagnosis not present

## 2023-02-16 DIAGNOSIS — Z7901 Long term (current) use of anticoagulants: Secondary | ICD-10-CM | POA: Diagnosis not present

## 2023-02-16 DIAGNOSIS — K3189 Other diseases of stomach and duodenum: Secondary | ICD-10-CM | POA: Diagnosis not present

## 2023-02-16 DIAGNOSIS — D649 Anemia, unspecified: Secondary | ICD-10-CM | POA: Diagnosis not present

## 2023-02-16 DIAGNOSIS — I4891 Unspecified atrial fibrillation: Secondary | ICD-10-CM | POA: Diagnosis not present

## 2023-02-16 DIAGNOSIS — I350 Nonrheumatic aortic (valve) stenosis: Secondary | ICD-10-CM | POA: Diagnosis present

## 2023-02-16 DIAGNOSIS — E876 Hypokalemia: Secondary | ICD-10-CM | POA: Diagnosis present

## 2023-02-16 DIAGNOSIS — E109 Type 1 diabetes mellitus without complications: Secondary | ICD-10-CM | POA: Diagnosis not present

## 2023-02-16 DIAGNOSIS — E1065 Type 1 diabetes mellitus with hyperglycemia: Secondary | ICD-10-CM | POA: Diagnosis present

## 2023-02-16 DIAGNOSIS — Z9641 Presence of insulin pump (external) (internal): Secondary | ICD-10-CM | POA: Diagnosis present

## 2023-02-16 DIAGNOSIS — E10649 Type 1 diabetes mellitus with hypoglycemia without coma: Secondary | ICD-10-CM | POA: Diagnosis not present

## 2023-02-16 DIAGNOSIS — K8689 Other specified diseases of pancreas: Secondary | ICD-10-CM | POA: Diagnosis present

## 2023-02-16 LAB — COMPREHENSIVE METABOLIC PANEL
ALT: 18 U/L (ref 0–44)
AST: 21 U/L (ref 15–41)
Albumin: 2.9 g/dL — ABNORMAL LOW (ref 3.5–5.0)
Alkaline Phosphatase: 48 U/L (ref 38–126)
Anion gap: 5 (ref 5–15)
BUN: 21 mg/dL (ref 8–23)
CO2: 17 mmol/L — ABNORMAL LOW (ref 22–32)
Calcium: 8 mg/dL — ABNORMAL LOW (ref 8.9–10.3)
Chloride: 114 mmol/L — ABNORMAL HIGH (ref 98–111)
Creatinine, Ser: 0.69 mg/dL (ref 0.44–1.00)
GFR, Estimated: >60 mL/min (ref >60)
Glucose, Bld: 137 mg/dL — ABNORMAL HIGH (ref 70–99)
Potassium: 3.1 mmol/L — ABNORMAL LOW (ref 3.5–5.1)
Sodium: 136 mmol/L (ref 135–145)
Total Bilirubin: 0.5 mg/dL (ref 0.0–1.2)
Total Protein: 5.2 g/dL — ABNORMAL LOW (ref 6.5–8.1)

## 2023-02-16 LAB — CBC
HCT: 31.1 % — ABNORMAL LOW (ref 36.0–46.0)
Hemoglobin: 9.9 g/dL — ABNORMAL LOW (ref 12.0–15.0)
MCH: 28.9 pg (ref 26.0–34.0)
MCHC: 31.8 g/dL (ref 30.0–36.0)
MCV: 90.7 fL (ref 80.0–100.0)
Platelets: 264 10*3/uL (ref 150–400)
RBC: 3.43 MIL/uL — ABNORMAL LOW (ref 3.87–5.11)
RDW: 13.9 % (ref 11.5–15.5)
WBC: 5.2 10*3/uL (ref 4.0–10.5)
nRBC: 0 % (ref 0.0–0.2)

## 2023-02-16 LAB — GLUCOSE, CAPILLARY
Glucose-Capillary: 148 mg/dL — ABNORMAL HIGH (ref 70–99)
Glucose-Capillary: 50 mg/dL — ABNORMAL LOW (ref 70–99)
Glucose-Capillary: 69 mg/dL — ABNORMAL LOW (ref 70–99)
Glucose-Capillary: 23 mg/dL — CL (ref 70–99)
Glucose-Capillary: 47 mg/dL — ABNORMAL LOW (ref 70–99)
Glucose-Capillary: 89 mg/dL (ref 70–99)
Glucose-Capillary: 99 mg/dL (ref 70–99)

## 2023-02-16 LAB — PHOSPHORUS: Phosphorus: 2.2 mg/dL — ABNORMAL LOW (ref 2.5–4.6)

## 2023-02-16 LAB — MAGNESIUM: Magnesium: 1.8 mg/dL (ref 1.7–2.4)

## 2023-02-16 MED ORDER — POTASSIUM PHOSPHATES 15 MMOLE/5ML IV SOLN
30.0000 mmol | Freq: Once | INTRAVENOUS | Status: AC
  Administered 2023-02-16: 30 mmol via INTRAVENOUS
  Filled 2023-02-16: qty 10

## 2023-02-16 MED ORDER — RIFAXIMIN 550 MG PO TABS
550.0000 mg | ORAL_TABLET | Freq: Three times a day (TID) | ORAL | Status: DC
  Administered 2023-02-16 – 2023-02-23 (×22): 550 mg via ORAL
  Filled 2023-02-16 (×22): qty 1

## 2023-02-16 NOTE — Progress Notes (Signed)
     [facility] Progress Note  CC:  Diarrhea and dehydration    Subjective: Still with a lot of diarrhea.  12 bowel movements from 7 PM to 7 AM/overnight.  So far already 7 bowel movement since 7:00 this morning.  Mostly watery.  She says it is improved to previous, but not much.  No blood.  Minimal abdominal discomfort.   Objective:  Vital signs in last 24 hours: Temp:  [97.6 F (36.4 C)-98 F (36.7 C)] 97.6 F (36.4 C) (01/16 0436) Pulse Rate:  [66-84] 66 (01/16 0436) Resp:  [17-18] 18 (01/16 0436) BP: (127-139)/(58-63) 139/58 (01/16 0436) SpO2:  [98 %-100 %] 100 % (01/16 0436) Last BM Date : 02/15/23 General:  Alert, Well-developed, in NAD Heart:  Regular rate and rhythm; no murmurs Pulm: Clear to auscultation bilaterally.  No wheezes rales or rhonchi. Abdomen:  Soft, nondistended.  Bowel sounds present and somewhat hyperactive.  Minimal diffuse tenderness to palpation. Extremities:  Without edema. Neurologic:  Alert and oriented x 4;  grossly normal neurologically. Psych:  Alert and cooperative. Normal mood and affect.  Intake/Output from previous day: 01/15 0701 - 01/16 0700 In: 3699.2 [P.O.:220; I.V.:3479.2] Out: -   Lab Results: Recent Labs    02/14/23 0415 02/15/23 0421 02/16/23 0407  WBC 3.8* 4.8 5.2  HGB 9.3* 9.5* 9.9*  HCT 28.6* 29.4* 31.1*  PLT 253 268 264   BMET Recent Labs    02/14/23 0415 02/14/23 1141 02/15/23 0421 02/16/23 0407  NA 135  --  137 136  K 3.5  --  3.6 3.1*  CL 111  --  113* 114*  CO2 14*  --  15* 17*  GLUCOSE 210* 464* 279* 137*  BUN 37*  --  27* 21  CREATININE 1.55*  --  1.01* 0.69  CALCIUM 7.8*  --  8.3* 8.0*   LFT Recent Labs    02/16/23 0407  PROT 5.2*  ALBUMIN 2.9*  AST 21  ALT 18  ALKPHOS 48  BILITOT 0.5    Assessment / Plan: 1.  Recurrent diarrhea: Recent admission for the same with workup negative including negative stool studies normal colonoscopy with normal random biopsies, thought  related to postinfectious process/postviral process, eventually symptoms abated with Cholestyramine twice daily and Lomotil twice daily, patient returned again on 02/14/2023 with the same symptoms and signs of dehydration, repeat C. difficile and GI path panel negative, currently fecal calprotectin and fecal pancreatic elastase are pending as well as celiac studies.  Thyroid studies are normal.  Unclear origin at this point. 2.  Type 1 diabetes on insulin pump 3.  Paroxysmal A-fib on Xarelto 4.  AKI/dehydration: Related to diarrhea.  Improved with IV hydration. 5.  Hypokalemia: Related diarrhea.  Potassium is low again this morning at 3.1.  -No repeat endoscopic evaluation is planned. -Follow-up results of celiac testing as well as fecal calprotectin and fecal pancreatic elastase. -Continue Lomotil 4 times daily and cholestyramine 3 times daily for now. -We will start empiric Xifaxan therapy 550 mg 3 times daily. -Will order stool quantitative studies for sodium, potassium, osmolality. -Continue conservative measures/supportive care. -Will follow-up with her long-term primary gastroenterology team with [facility] upon discharge.    LOS: 0 days   Donna Wiley. Donna Wiley  02/16/2023, 9:45 AM

## 2023-02-16 NOTE — Plan of Care (Signed)

## 2023-02-16 NOTE — Inpatient Diabetes Management (Incomplete)
Inpatient Diabetes Program Recommendations  AACE/ADA: New Consensus Statement on Inpatient Glycemic Control (2015)  Target Ranges:  Prepandial:   less than 140 mg/dL      Peak postprandial:   less than 180 mg/dL (1-2 hours)      Critically ill patients:  140 - 180 mg/dL   Lab Results  Component Value Date   GLUCAP 69 (L) 02/16/2023   HGBA1C 7.7 (H) 02/15/2023    Review of Glycemic Control  Diabetes history: DM1 Outpatient Diabetes medications: Insulin pump Current orders for Inpatient glycemic control: Insulin pump  MN .35 MN 10 MN 45-->40 MN 140  0300 .4 0600 3.3 0600 40-->35 120  0700 .55 1100 3.7 140  1400 .3 1700 8.5  1800 .5   Total Basal insulin = 10.7 units/day.  Auto basal insulin = N/a Active insulin time = 4    Endo: Dr Roanna Raider CBGs 148, 50, 69 today.  Inpatient Diabetes Program Recommendations:    Spoke with pt briefly at bedside regarding hypoglycemia today.

## 2023-02-16 NOTE — Progress Notes (Signed)
PROGRESS NOTE  Donna Wiley WUJ:[id number] DOB: [birth year] DOA: 02/13/2023 PCP: Raquel James, MD   LOS: 0 days   Brief Narrative / Interim history: 83 year old female with history of DM1 on insulin pump, PAF on Xarelto, aortic stenosis comes into the hospital with several weeks of watery diarrhea.  She was hospitalized December 2020 for 4 profuse diarrhea, underwent GI workup with colonoscopy, infectious workup, all negative.  She was placed on loperamide as well as cholestyramine, improved and discharged home.  As she finished taking the medications, diarrhea returned and decided come back to the hospital.  Subjective / 24h Interval events: Continues to have diarrhea, 15 liquid stools yesterday and 12 overnight.  To this morning already.  Assesement and Plan: Principal problem Subacute diarrheal illness -patient hospitalized in December with similar symptoms, underwent a colonoscopy on 12/13 which showed normal terminal ileum, normal colon, random biopsies taken and was negative for microscopic colitis -Recent workup was negative for stool studies -Symptoms improved with cholestyramine as well as Lomotil, but as soon as she finished this diarrhea returned -Etiology not clear, GI consulted.  Continue abortive care, appreciate GI follow-up  Active problems Diabetes mellitus, type I-continue insulin pump, CBGs better  PAF-continue flecainide, Xarelto  Acute kidney injury non, anion gap metabolic acidosis-creatinine 2.8 on admission, improved to 1.0 with fluids.  Hypokalemia-replace as indicated.  Monitor magnesium  Hypophosphatemia-replace phosphorus today  Mild normocytic anemia-stable, no bleeding  Scheduled Meds:  cholestyramine light  4 g Oral TID   diphenoxylate-atropine  1 tablet Oral QID   feeding supplement  237 mL Oral TID BM   flecainide  50 mg Oral Daily   And   flecainide  100 mg Oral QHS   insulin pump   Subcutaneous TID WC, HS, 0200   LORazepam  1 mg Oral  QHS   rivaroxaban  15 mg Oral Q supper   sodium bicarbonate  650 mg Oral TID   Continuous Infusions:  sodium chloride 100 mL/hr at 02/16/23 9562   potassium PHOSPHATE IVPB (in mmol)     PRN Meds:.acetaminophen **OR** acetaminophen, ondansetron **OR** ondansetron (ZOFRAN) IV, mouth rinse  Current Outpatient Medications  Medication Instructions   acetaminophen (TYLENOL) 1,000 mg, Oral, Every 6 hours PRN   b complex vitamins capsule 1 capsule, Every evening   calcium citrate (CALCITRATE - DOSED IN MG ELEMENTAL CALCIUM) 950 (200 Ca) MG tablet 500 mg of elemental calcium, 2 times daily   cholestyramine (QUESTRAN) 4 g, Oral, 2 times daily   clobetasol cream (TEMOVATE) 0.05 % 1 Application, 2 times daily PRN   diltiazem (CARDIZEM CD) 180 mg, Nightly   EPINEPHrine (EPI-PEN) 0.3 mg, As needed   flecainide (TAMBOCOR) 50-100 mg, See admin instructions   insulin lispro (HUMALOG) 0-15 Units, 4 times daily   LORazepam (ATIVAN) 0.5 mg, Nightly   mirabegron ER (MYRBETRIQ) 50 mg, Daily   [Paused] olmesartan-hydrochlorothiazide (BENICAR HCT) 40-12.5 MG tablet 1 tablet, Every morning   ondansetron (ZOFRAN) 4 mg, Oral, Every 6 hours   pantoprazole (PROTONIX) 40 mg, Daily   rivaroxaban (XARELTO) 20 mg, Every evening   simvastatin (ZOCOR) 10 mg, Daily at bedtime    Diet Orders (From admission, onward)     Start     Ordered   02/13/23 2312  Diet Carb Modified Fluid consistency: Thin; Room service appropriate? Yes  Diet effective now       Question Answer Comment  Diet-HS Snack? Snack-yes   Calorie Level Medium 1600-2000   Fluid consistency: Thin   Room  service appropriate? Yes      02/13/23 2311            DVT prophylaxis: Place and maintain sequential compression device Start: 02/14/23 1119 Rivaroxaban (XARELTO) tablet 15 mg   Lab Results  Component Value Date   PLT 264 02/16/2023      Code Status: Full Code  Family Communication: No family at bedside  Status is:  Inpatient  Level of care: Telemetry  Consultants:  GI  Objective: Vitals:   02/15/23 0503 02/15/23 1145 02/15/23 2129 02/16/23 0436  BP: (!) 118/51 127/61 138/63 (!) 139/58  Pulse: 73 84 77 66  Resp: 18 17 17 18   Temp: 98.4 F (36.9 C) 98 F (36.7 C) 97.9 F (36.6 C) 97.6 F (36.4 C)  TempSrc: Oral Oral Oral Oral  SpO2: 99% 98% 100% 100%  Weight:      Height:        Intake/Output Summary (Last 24 hours) at 02/16/2023 1021 Last data filed at 02/16/2023 0300 Gross per 24 hour  Intake 3479.23 ml  Output --  Net 3479.23 ml   Wt Readings from Last 3 Encounters:  02/13/23 54.8 kg  02/04/23 61.2 kg  01/17/23 59.3 kg    Examination:  Constitutional: NAD Eyes: lids and conjunctivae normal, no scleral icterus ENMT: mmm Neck: normal, supple Respiratory: clear to auscultation bilaterally, no wheezing, no crackles. Normal respiratory effort.  Cardiovascular: Regular rate and rhythm, no murmurs / rubs / gallops. No LE edema. Abdomen: soft, no distention, no tenderness. Bowel sounds positive.   Data Reviewed: I have independently reviewed following labs and imaging studies   CBC Recent Labs  Lab 02/13/23 1506 02/13/23 1635 02/14/23 0415 02/15/23 0421 02/16/23 0407  WBC 7.1  --  3.8* 4.8 5.2  HGB 11.2* 11.9* 9.3* 9.5* 9.9*  HCT 35.2* 35.0* 28.6* 29.4* 31.1*  PLT 342  --  253 268 264  MCV 89.6  --  87.5 88.8 90.7  MCH 28.5  --  28.4 28.7 28.9  MCHC 31.8  --  32.5 32.3 31.8  RDW 13.5  --  13.5 13.9 13.9  LYMPHSABS  --   --   --  1.3  --   MONOABS  --   --   --  0.5  --   EOSABS  --   --   --  0.1  --   BASOSABS  --   --   --  0.0  --     Recent Labs  Lab 02/13/23 1506 02/13/23 1615 02/13/23 1635 02/13/23 1835 02/13/23 2220 02/14/23 0415 02/14/23 1141 02/15/23 0421 02/16/23 0407  NA 128*  --  136  --  137 135  --  137 136  K 3.9  --  4.0  --  3.4* 3.5  --  3.6 3.1*  CL 103  --   --   --  114* 111  --  113* 114*  CO2 10*  --   --   --  11* 14*  --  15*  17*  GLUCOSE 306*  --   --   --  213* 210* 464* 279* 137*  BUN 46*  --   --   --  42* 37*  --  27* 21  CREATININE 2.79*  --   --   --  2.00* 1.55*  --  1.01* 0.69  CALCIUM 8.4*  --   --   --  8.1* 7.8*  --  8.3* 8.0*  AST 21  --   --   --   --   --   --  21 21  ALT 21  --   --   --   --   --   --  19 18  ALKPHOS 54  --   --   --   --   --   --  46 48  BILITOT 0.7  --   --   --   --   --   --  0.7 0.5  ALBUMIN 3.4*  --   --   --   --   --   --  3.1* 2.9*  MG  --   --   --   --   --  1.6*  --  1.9 1.8  LATICACIDVEN  --  1.6  --  0.9  --   --   --   --   --   TSH  --   --   --   --   --   --   --  1.264  --   HGBA1C  --   --   --   --   --   --   --  7.7*  --     ------------------------------------------------------------------------------------------------------------------ No results for input(s): "CHOL", "HDL", "LDLCALC", "TRIG", "CHOLHDL", "LDLDIRECT" in the last 72 hours.  Lab Results  Component Value Date   HGBA1C 7.7 (H) 02/15/2023   ------------------------------------------------------------------------------------------------------------------ Recent Labs    02/15/23 0421  TSH 1.264    Cardiac Enzymes No results for input(s): "CKMB", "TROPONINI", "MYOGLOBIN" in the last 168 hours.  Invalid input(s): "CK" ------------------------------------------------------------------------------------------------------------------ No results found for: "BNP"  CBG: Recent Labs  Lab 02/15/23 1143 02/15/23 1647 02/15/23 2129 02/16/23 0224 02/16/23 0742  GLUCAP 214* 73 172* 148* 50*    Recent Results (from the past 240 hours)  Gastrointestinal Panel by PCR , Stool     Status: None   Collection Time: 02/13/23  4:14 PM   Specimen: Stool  Result Value Ref Range Status   Campylobacter species NOT DETECTED NOT DETECTED Final   Plesimonas shigelloides NOT DETECTED NOT DETECTED Final   Salmonella species NOT DETECTED NOT DETECTED Final   Yersinia enterocolitica NOT DETECTED NOT  DETECTED Final   Vibrio species NOT DETECTED NOT DETECTED Final   Vibrio cholerae NOT DETECTED NOT DETECTED Final   Enteroaggregative E coli (EAEC) NOT DETECTED NOT DETECTED Final   Enteropathogenic E coli (EPEC) NOT DETECTED NOT DETECTED Final   Enterotoxigenic E coli (ETEC) NOT DETECTED NOT DETECTED Final   Shiga like toxin producing E coli (STEC) NOT DETECTED NOT DETECTED Final   Shigella/Enteroinvasive E coli (EIEC) NOT DETECTED NOT DETECTED Final   Cryptosporidium NOT DETECTED NOT DETECTED Final   Cyclospora cayetanensis NOT DETECTED NOT DETECTED Final   Entamoeba histolytica NOT DETECTED NOT DETECTED Final   Giardia lamblia NOT DETECTED NOT DETECTED Final   Adenovirus F40/41 NOT DETECTED NOT DETECTED Final   Astrovirus NOT DETECTED NOT DETECTED Final   Norovirus GI/GII NOT DETECTED NOT DETECTED Final   Rotavirus A NOT DETECTED NOT DETECTED Final   Sapovirus (I, II, IV, and V) NOT DETECTED NOT DETECTED Final    Comment: Performed at [facility], [address] [place], [place] [address]  C Difficile Quick Screen w PCR reflex     Status: None   Collection Time: 02/13/23  4:15 PM   Specimen: Stool  Result Value Ref Range Status   C Diff antigen NEGATIVE NEGATIVE Final   C Diff toxin NEGATIVE NEGATIVE Final   C Diff interpretation No C. difficile  detected.  Final    Comment: Performed at [facility], [address] [place], [place] [address]     Radiology Studies: No results found.   Pamella Pert, MD, PhD [facility]  Between 7 am - 7 pm I am available, please contact me via Amion (for emergencies) or Securechat (non urgent messages)  Between 7 pm - 7 am I am not available, please contact night coverage MD/APP via Amion

## 2023-02-17 DIAGNOSIS — R195 Other fecal abnormalities: Secondary | ICD-10-CM

## 2023-02-17 DIAGNOSIS — K529 Noninfective gastroenteritis and colitis, unspecified: Secondary | ICD-10-CM | POA: Diagnosis not present

## 2023-02-17 DIAGNOSIS — N179 Acute kidney failure, unspecified: Secondary | ICD-10-CM | POA: Diagnosis not present

## 2023-02-17 DIAGNOSIS — E860 Dehydration: Secondary | ICD-10-CM | POA: Diagnosis not present

## 2023-02-17 LAB — COMPREHENSIVE METABOLIC PANEL
ALT: 16 U/L (ref 0–44)
AST: 19 U/L (ref 15–41)
Albumin: 2.4 g/dL — ABNORMAL LOW (ref 3.5–5.0)
Alkaline Phosphatase: 38 U/L (ref 38–126)
Anion gap: 6 (ref 5–15)
BUN: 14 mg/dL (ref 8–23)
CO2: 14 mmol/L — ABNORMAL LOW (ref 22–32)
Calcium: 7.9 mg/dL — ABNORMAL LOW (ref 8.9–10.3)
Chloride: 118 mmol/L — ABNORMAL HIGH (ref 98–111)
Creatinine, Ser: 0.61 mg/dL (ref 0.44–1.00)
GFR, Estimated: >60 mL/min (ref >60)
Glucose, Bld: 219 mg/dL — ABNORMAL HIGH (ref 70–99)
Potassium: 3.6 mmol/L (ref 3.5–5.1)
Sodium: 138 mmol/L (ref 135–145)
Total Bilirubin: 0.3 mg/dL (ref 0.0–1.2)
Total Protein: 4.4 g/dL — ABNORMAL LOW (ref 6.5–8.1)

## 2023-02-17 LAB — GLUCOSE, CAPILLARY
Glucose-Capillary: 190 mg/dL — ABNORMAL HIGH (ref 70–99)
Glucose-Capillary: 142 mg/dL — ABNORMAL HIGH (ref 70–99)
Glucose-Capillary: 132 mg/dL — ABNORMAL HIGH (ref 70–99)
Glucose-Capillary: 100 mg/dL — ABNORMAL HIGH (ref 70–99)
Glucose-Capillary: 115 mg/dL — ABNORMAL HIGH (ref 70–99)

## 2023-02-17 LAB — CBC
HCT: 28 % — ABNORMAL LOW (ref 36.0–46.0)
Hemoglobin: 8.7 g/dL — ABNORMAL LOW (ref 12.0–15.0)
MCH: 28.5 pg (ref 26.0–34.0)
MCHC: 31.1 g/dL (ref 30.0–36.0)
MCV: 91.8 fL (ref 80.0–100.0)
Platelets: 217 10*3/uL (ref 150–400)
RBC: 3.05 MIL/uL — ABNORMAL LOW (ref 3.87–5.11)
RDW: 14.2 % (ref 11.5–15.5)
WBC: 5 10*3/uL (ref 4.0–10.5)
nRBC: 0 % (ref 0.0–0.2)

## 2023-02-17 LAB — PHOSPHORUS: Phosphorus: 2.9 mg/dL (ref 2.5–4.6)

## 2023-02-17 LAB — CALPROTECTIN, FECAL: Calprotectin, Fecal: 274 ug/g — ABNORMAL HIGH (ref 0–120)

## 2023-02-17 LAB — MAGNESIUM: Magnesium: 1.3 mg/dL — ABNORMAL LOW (ref 1.7–2.4)

## 2023-02-17 MED ORDER — POTASSIUM CHLORIDE CRYS ER 20 MEQ PO TBCR
40.0000 meq | EXTENDED_RELEASE_TABLET | Freq: Once | ORAL | Status: AC
  Administered 2023-02-17: 40 meq via ORAL
  Filled 2023-02-17: qty 2

## 2023-02-17 MED ORDER — MAGNESIUM SULFATE 4 GM/100ML IV SOLN
4.0000 g | Freq: Once | INTRAVENOUS | Status: AC
  Administered 2023-02-17: 4 g via INTRAVENOUS
  Filled 2023-02-17: qty 100

## 2023-02-17 NOTE — Progress Notes (Addendum)
     [facility] Progress Note  CC:  Diarrhea and dehydration   Subjective: Really unchanged from yesterday.  No new complaints.  Still several watery diarrhea stools, 7 overnight.  Objective:  Vital signs in last 24 hours: Temp:  [98.2 F (36.8 C)-98.5 F (36.9 C)] 98.2 F (36.8 C) (01/17 0320) Pulse Rate:  [65-75] 65 (01/17 0320) Resp:  [18-19] 19 (01/17 0320) BP: (134-163)/(67-75) 163/67 (01/17 0320) SpO2:  [99 %-100 %] 99 % (01/17 0320) Last BM Date : 02/16/23 General:  Alert, Well-developed, in NAD Heart:  Regular rate and rhythm; no murmurs Pulm: Clear to auscultation bilaterally. Abdomen:  Soft, nondistended.  Bowel sounds present.  Nontender. Extremities:  Without edema. Neurologic:  Alert and oriented x 4;  grossly normal neurologically. Psych:  Alert and cooperative. Normal mood and affect.  Intake/Output from previous day: 01/16 0701 - 01/17 0700 In: 3128.5 [P.O.:480; I.V.:2366.3; IV Piggyback:282.2] Out: -   Lab Results: Recent Labs    02/15/23 0421 02/16/23 0407 02/17/23 0432  WBC 4.8 5.2 5.0  HGB 9.5* 9.9* 8.7*  HCT 29.4* 31.1* 28.0*  PLT 268 264 217   BMET Recent Labs    02/15/23 0421 02/16/23 0407 02/17/23 0432  NA 137 136 138  K 3.6 3.1* 3.6  CL 113* 114* 118*  CO2 15* 17* 14*  GLUCOSE 279* 137* 219*  BUN 27* 21 14  CREATININE 1.01* 0.69 0.61  CALCIUM 8.3* 8.0* 7.9*   LFT Recent Labs    02/17/23 0432  PROT 4.4*  ALBUMIN 2.4*  AST 19  ALT 16  ALKPHOS 38  BILITOT 0.3   Assessment / Plan: 1.  Recurrent diarrhea: Recent admission for the same with workup negative including negative stool studies normal colonoscopy with normal random biopsies, thought related to postinfectious process/postviral process, eventually symptoms abated with Cholestyramine twice daily and Lomotil twice daily, patient returned again on 02/14/2023 with the same symptoms and signs of dehydration, repeat C. difficile and GI path panel negative,  currently fecal pancreatic elastase are pending as well as celiac studies and new stool studies that were ordered yesterday.  Thyroid studies are normal.  Fecal calprotectin still elevated at 274 compared to 297 one month ago.  Unclear origin at this point. 2.  Type 1 diabetes on insulin pump 3.  Paroxysmal A-fib on Xarelto 4.  AKI/dehydration: Related to diarrhea.  Improved with IV hydration. 5.  Hypokalemia: Related diarrhea.  Potassium normal this morning.   -No repeat endoscopic evaluation is planned. -Follow-up results of celiac testing as well as fecal pancreatic elastase. -Continue Lomotil 4 times daily and cholestyramine 3 times daily for now. -Start empiric Xifaxan therapy 550 mg 3 times daily on 1/16. -Follow-up stool quantitative studies for sodium, potassium, osmolality.  Hopefully container for 48-hour stool collection will arrive today. -Continue conservative measures/supportive care. -Will follow-up with her long-term primary gastroenterology team with [facility] upon discharge.     LOS: 1 day   [name]. Sejal Cofield  02/17/2023, 9:16 AM

## 2023-02-17 NOTE — Plan of Care (Signed)

## 2023-02-17 NOTE — Progress Notes (Signed)
PROGRESS NOTE  Donna Wiley AVW:[id number] DOB: [birth year] DOA: 02/13/2023 PCP: Raquel James, MD   LOS: 1 day   Brief Narrative / Interim history: 83 year old female with history of DM1 on insulin pump, PAF on Xarelto, aortic stenosis comes into the hospital with several weeks of watery diarrhea.  She was hospitalized December 2020 for 4 profuse diarrhea, underwent GI workup with colonoscopy, infectious workup, all negative.  She was placed on loperamide as well as cholestyramine, improved and discharged home.  As she finished taking the medications, diarrhea returned and decided come back to the hospital.  Subjective / 24h Interval events: Diarrhea and proving.  She is feeling stronger.  Assesement and Plan: Principal problem Subacute diarrheal illness -patient hospitalized in December with similar symptoms, underwent a colonoscopy on 12/13 which showed normal terminal ileum, normal colon, random biopsies taken and was negative for microscopic colitis -Recent workup was negative for stool studies -Symptoms improved with cholestyramine as well as Lomotil, but as soon as she finished this diarrhea returned -Etiology not clear, GI consulted.  Continue abortive care, appreciate GI follow-up -Now started on rifaximin, additional stool studies pending per GI  Active problems Diabetes mellitus, type I-continue insulin pump, CBGs better  PAF-continue flecainide, Xarelto  Acute kidney injury non, anion gap metabolic acidosis-creatinine 2.8 on admission, improved after fluids  Hypokalemia, hypomagnesemia-continue to replace as indicated.  Hypophosphatemia-phosphorus improved  Mild normocytic anemia-stable, no bleeding  Scheduled Meds:  cholestyramine light  4 g Oral TID   diphenoxylate-atropine  1 tablet Oral QID   feeding supplement  237 mL Oral TID BM   flecainide  50 mg Oral Daily   And   flecainide  100 mg Oral QHS   insulin pump   Subcutaneous TID WC, HS, 0200   LORazepam   1 mg Oral QHS   rifaximin  550 mg Oral TID   rivaroxaban  15 mg Oral Q supper   sodium bicarbonate  650 mg Oral TID   Continuous Infusions:  sodium chloride 100 mL/hr at 02/17/23 0317   PRN Meds:.acetaminophen **OR** acetaminophen, ondansetron **OR** ondansetron (ZOFRAN) IV, mouth rinse  Current Outpatient Medications  Medication Instructions   acetaminophen (TYLENOL) 1,000 mg, Oral, Every 6 hours PRN   b complex vitamins capsule 1 capsule, Every evening   calcium citrate (CALCITRATE - DOSED IN MG ELEMENTAL CALCIUM) 950 (200 Ca) MG tablet 500 mg of elemental calcium, 2 times daily   cholestyramine (QUESTRAN) 4 g, Oral, 2 times daily   clobetasol cream (TEMOVATE) 0.05 % 1 Application, 2 times daily PRN   diltiazem (CARDIZEM CD) 180 mg, Nightly   EPINEPHrine (EPI-PEN) 0.3 mg, As needed   flecainide (TAMBOCOR) 50-100 mg, See admin instructions   insulin lispro (HUMALOG) 0-15 Units, 4 times daily   LORazepam (ATIVAN) 0.5 mg, Nightly   mirabegron ER (MYRBETRIQ) 50 mg, Daily   [Paused] olmesartan-hydrochlorothiazide (BENICAR HCT) 40-12.5 MG tablet 1 tablet, Every morning   ondansetron (ZOFRAN) 4 mg, Oral, Every 6 hours   pantoprazole (PROTONIX) 40 mg, Daily   rivaroxaban (XARELTO) 20 mg, Every evening   simvastatin (ZOCOR) 10 mg, Daily at bedtime    Diet Orders (From admission, onward)     Start     Ordered   02/13/23 2312  Diet Carb Modified Fluid consistency: Thin; Room service appropriate? Yes  Diet effective now       Question Answer Comment  Diet-HS Snack? Snack-yes   Calorie Level Medium 1600-2000   Fluid consistency: Thin   Room service appropriate?  Yes      02/13/23 2311            DVT prophylaxis: Place and maintain sequential compression device Start: 02/14/23 1119 Rivaroxaban (XARELTO) tablet 15 mg   Lab Results  Component Value Date   PLT 217 02/17/2023      Code Status: Full Code  Family Communication: No family at bedside  Status is:  Inpatient  Level of care: Telemetry  Consultants:  GI  Objective: Vitals:   02/16/23 0436 02/16/23 1309 02/16/23 2223 02/17/23 0320  BP: (!) 139/58 134/72 (!) 161/75 (!) 163/67  Pulse: 66 67 75 65  Resp: 18 18 18 19   Temp: 97.6 F (36.4 C) 98.5 F (36.9 C) 98.5 F (36.9 C) 98.2 F (36.8 C)  TempSrc: Oral Oral Oral Oral  SpO2: 100% 99% 100% 99%  Weight:      Height:        Intake/Output Summary (Last 24 hours) at 02/17/2023 1040 Last data filed at 02/17/2023 0901 Gross per 24 hour  Intake 3108.5 ml  Output --  Net 3108.5 ml   Wt Readings from Last 3 Encounters:  02/13/23 54.8 kg  02/04/23 61.2 kg  01/17/23 59.3 kg    Examination:  Constitutional: NAD Eyes: lids and conjunctivae normal, no scleral icterus ENMT: mmm Neck: normal, supple Respiratory: clear to auscultation bilaterally, no wheezing, no crackles. Normal respiratory effort.  Cardiovascular: Regular rate and rhythm, no murmurs / rubs / gallops. No LE edema. Abdomen: soft, no distention, no tenderness. Bowel sounds positive.   Data Reviewed: I have independently reviewed following labs and imaging studies   CBC Recent Labs  Lab 02/13/23 1506 02/13/23 1635 02/14/23 0415 02/15/23 0421 02/16/23 0407 02/17/23 0432  WBC 7.1  --  3.8* 4.8 5.2 5.0  HGB 11.2* 11.9* 9.3* 9.5* 9.9* 8.7*  HCT 35.2* 35.0* 28.6* 29.4* 31.1* 28.0*  PLT 342  --  253 268 264 217  MCV 89.6  --  87.5 88.8 90.7 91.8  MCH 28.5  --  28.4 28.7 28.9 28.5  MCHC 31.8  --  32.5 32.3 31.8 31.1  RDW 13.5  --  13.5 13.9 13.9 14.2  LYMPHSABS  --   --   --  1.3  --   --   MONOABS  --   --   --  0.5  --   --   EOSABS  --   --   --  0.1  --   --   BASOSABS  --   --   --  0.0  --   --     Recent Labs  Lab 02/13/23 1506 02/13/23 1615 02/13/23 1635 02/13/23 1835 02/13/23 2220 02/14/23 0415 02/14/23 1141 02/15/23 0421 02/16/23 0407 02/17/23 0432  NA 128*  --    < >  --  137 135  --  137 136 138  K 3.9  --    < >  --  3.4* 3.5  --   3.6 3.1* 3.6  CL 103  --   --   --  114* 111  --  113* 114* 118*  CO2 10*  --   --   --  11* 14*  --  15* 17* 14*  GLUCOSE 306*  --   --   --  213* 210* 464* 279* 137* 219*  BUN 46*  --   --   --  42* 37*  --  27* 21 14  CREATININE 2.79*  --   --   --  2.00* 1.55*  --  1.01* 0.69 0.61  CALCIUM 8.4*  --   --   --  8.1* 7.8*  --  8.3* 8.0* 7.9*  AST 21  --   --   --   --   --   --  21 21 19   ALT 21  --   --   --   --   --   --  19 18 16   ALKPHOS 54  --   --   --   --   --   --  46 48 38  BILITOT 0.7  --   --   --   --   --   --  0.7 0.5 0.3  ALBUMIN 3.4*  --   --   --   --   --   --  3.1* 2.9* 2.4*  MG  --   --   --   --   --  1.6*  --  1.9 1.8 1.3*  LATICACIDVEN  --  1.6  --  0.9  --   --   --   --   --   --   TSH  --   --   --   --   --   --   --  1.264  --   --   HGBA1C  --   --   --   --   --   --   --  7.7*  --   --    < > = values in this interval not displayed.    ------------------------------------------------------------------------------------------------------------------ No results for input(s): "CHOL", "HDL", "LDLCALC", "TRIG", "CHOLHDL", "LDLDIRECT" in the last 72 hours.  Lab Results  Component Value Date   HGBA1C 7.7 (H) 02/15/2023   ------------------------------------------------------------------------------------------------------------------ Recent Labs    02/15/23 0421  TSH 1.264    Cardiac Enzymes No results for input(s): "CKMB", "TROPONINI", "MYOGLOBIN" in the last 168 hours.  Invalid input(s): "CK" ------------------------------------------------------------------------------------------------------------------ No results found for: "BNP"  CBG: Recent Labs  Lab 02/16/23 1732 02/16/23 2119 02/16/23 2153 02/17/23 0314 02/17/23 0744  GLUCAP 47* 89 99 190* 142*    Recent Results (from the past 240 hours)  Gastrointestinal Panel by PCR , Stool     Status: None   Collection Time: 02/13/23  4:14 PM   Specimen: Stool  Result Value Ref Range Status    Campylobacter species NOT DETECTED NOT DETECTED Final   Plesimonas shigelloides NOT DETECTED NOT DETECTED Final   Salmonella species NOT DETECTED NOT DETECTED Final   Yersinia enterocolitica NOT DETECTED NOT DETECTED Final   Vibrio species NOT DETECTED NOT DETECTED Final   Vibrio cholerae NOT DETECTED NOT DETECTED Final   Enteroaggregative E coli (EAEC) NOT DETECTED NOT DETECTED Final   Enteropathogenic E coli (EPEC) NOT DETECTED NOT DETECTED Final   Enterotoxigenic E coli (ETEC) NOT DETECTED NOT DETECTED Final   Shiga like toxin producing E coli (STEC) NOT DETECTED NOT DETECTED Final   Shigella/Enteroinvasive E coli (EIEC) NOT DETECTED NOT DETECTED Final   Cryptosporidium NOT DETECTED NOT DETECTED Final   Cyclospora cayetanensis NOT DETECTED NOT DETECTED Final   Entamoeba histolytica NOT DETECTED NOT DETECTED Final   Giardia lamblia NOT DETECTED NOT DETECTED Final   Adenovirus F40/41 NOT DETECTED NOT DETECTED Final   Astrovirus NOT DETECTED NOT DETECTED Final   Norovirus GI/GII NOT DETECTED NOT DETECTED Final   Rotavirus A NOT DETECTED NOT DETECTED Final   Sapovirus (I, II, IV, and V) NOT DETECTED  NOT DETECTED Final    Comment: Performed at [facility], [address] [place], [place] [address]  C Difficile Quick Screen w PCR reflex     Status: None   Collection Time: 02/13/23  4:15 PM   Specimen: Stool  Result Value Ref Range Status   C Diff antigen NEGATIVE NEGATIVE Final   C Diff toxin NEGATIVE NEGATIVE Final   C Diff interpretation No C. difficile detected.  Final    Comment: Performed at [facility], [address] [place], [place] [address]  Calprotectin, Fecal     Status: Abnormal   Collection Time: 02/14/23  6:02 PM   Specimen: Stool  Result Value Ref Range Status   Calprotectin, Fecal 274 (H) 0 - 120 ug/g Final    Comment: (NOTE) Concentration     Interpretation   Follow-Up < 5 - 50 ug/g     Normal           None >50 -120 ug/g     Borderline        Re-evaluate in 4-6 weeks    >120 ug/g     Abnormal         Repeat as clinically                                   indicated Performed At: [facility] [address] [place], [place] [id number] Jolene Schimke MD EX:[id number]      Radiology Studies: No results found.   Pamella Pert, MD, PhD [facility]  Between 7 am - 7 pm I am available, please contact me via Amion (for emergencies) or Securechat (non urgent messages)  Between 7 pm - 7 am I am not available, please contact night coverage MD/APP via Amion

## 2023-02-17 NOTE — Inpatient Diabetes Management (Signed)
Inpatient Diabetes Program Recommendations  AACE/ADA: New Consensus Statement on Inpatient Glycemic Control (2015)  Target Ranges:  Prepandial:   less than 140 mg/dL      Peak postprandial:   less than 180 mg/dL (1-2 hours)      Critically ill patients:  140 - 180 mg/dL   Lab Results  Component Value Date   GLUCAP 132 (H) 02/17/2023   HGBA1C 7.7 (H) 02/15/2023    Review of Glycemic Control  Diabetes history: DM type 1 Outpatient Diabetes medications: Omnipod insulin pump with Humalog Current orders for Inpatient glycemic control:  Insulin pump order set  Endocrinologist: Monica Doer A1c 7.7% on 1/15  Spoke with pt at bedside regarding her hypoglycemia yesterday. Pump and CGM in good working order. Pt CBG machine is older and she will need another fingerstick meter at time of discharge. Pt is very savvy on her pump and keeps tight control over her glucose trends. Her Omnipod is located on her left lower abd, She has a Dexcom G7 located on right upper arm. She is scheduled to change her Omnipod on 1/18. She has several more days left on her Dexcom before she changes it out.  Needs meter to verify readings for CGM. Current meter is approx 84 years old. Blood glucose meter kit order #[id number]  Thanks,  Christena Deem RN, MSN, BC-ADM Inpatient Diabetes Coordinator Team Pager [phone] (8a-5p)

## 2023-02-18 DIAGNOSIS — R197 Diarrhea, unspecified: Secondary | ICD-10-CM | POA: Diagnosis not present

## 2023-02-18 DIAGNOSIS — N179 Acute kidney failure, unspecified: Secondary | ICD-10-CM | POA: Diagnosis not present

## 2023-02-18 DIAGNOSIS — R195 Other fecal abnormalities: Secondary | ICD-10-CM | POA: Diagnosis not present

## 2023-02-18 LAB — GLIA (IGA/G) + TTG IGA
Antigliadin Abs, IgA: 3 U (ref 0–19)
Gliadin IgG: 3 U (ref 0–19)
Tissue Transglutaminase Ab, IgA: <2 U/mL (ref 0–3)

## 2023-02-18 LAB — CBC
HCT: 29.2 % — ABNORMAL LOW (ref 36.0–46.0)
Hemoglobin: 9.3 g/dL — ABNORMAL LOW (ref 12.0–15.0)
MCH: 29.5 pg (ref 26.0–34.0)
MCHC: 31.8 g/dL (ref 30.0–36.0)
MCV: 92.7 fL (ref 80.0–100.0)
Platelets: 226 10*3/uL (ref 150–400)
RBC: 3.15 MIL/uL — ABNORMAL LOW (ref 3.87–5.11)
RDW: 14.3 % (ref 11.5–15.5)
WBC: 5 10*3/uL (ref 4.0–10.5)
nRBC: 0 % (ref 0.0–0.2)

## 2023-02-18 LAB — OSMOLALITY, STOOL: Osmolality,Stl: 429 mosm/kg

## 2023-02-18 LAB — COMPREHENSIVE METABOLIC PANEL
ALT: 16 U/L (ref 0–44)
AST: 19 U/L (ref 15–41)
Albumin: 2.5 g/dL — ABNORMAL LOW (ref 3.5–5.0)
Alkaline Phosphatase: 38 U/L (ref 38–126)
Anion gap: 4 — ABNORMAL LOW (ref 5–15)
BUN: 15 mg/dL (ref 8–23)
CO2: 16 mmol/L — ABNORMAL LOW (ref 22–32)
Calcium: 8 mg/dL — ABNORMAL LOW (ref 8.9–10.3)
Chloride: 118 mmol/L — ABNORMAL HIGH (ref 98–111)
Creatinine, Ser: 0.47 mg/dL (ref 0.44–1.00)
GFR, Estimated: >60 mL/min (ref >60)
Glucose, Bld: 86 mg/dL (ref 70–99)
Potassium: 3.8 mmol/L (ref 3.5–5.1)
Sodium: 138 mmol/L (ref 135–145)
Total Bilirubin: 0.4 mg/dL (ref 0.0–1.2)
Total Protein: 4.4 g/dL — ABNORMAL LOW (ref 6.5–8.1)

## 2023-02-18 LAB — GLUCOSE, CAPILLARY
Glucose-Capillary: 87 mg/dL (ref 70–99)
Glucose-Capillary: 89 mg/dL (ref 70–99)
Glucose-Capillary: 107 mg/dL — ABNORMAL HIGH (ref 70–99)
Glucose-Capillary: 209 mg/dL — ABNORMAL HIGH (ref 70–99)
Glucose-Capillary: 64 mg/dL — ABNORMAL LOW (ref 70–99)

## 2023-02-18 LAB — PANCREATIC ELASTASE, FECAL: Pancreatic Elastase-1, Stool: 60 ug Elast./g — ABNORMAL LOW (ref >200)

## 2023-02-18 LAB — MAGNESIUM: Magnesium: 1.9 mg/dL (ref 1.7–2.4)

## 2023-02-18 MED ORDER — PANCRELIPASE (LIP-PROT-AMYL) 12000-38000 UNITS PO CPEP
72000.0000 [IU] | ORAL_CAPSULE | Freq: Three times a day (TID) | ORAL | Status: DC
  Administered 2023-02-18 – 2023-02-23 (×12): 72000 [IU] via ORAL
  Filled 2023-02-18 (×14): qty 6

## 2023-02-18 NOTE — Progress Notes (Signed)
    Progress Note   Assessment    83 year old with type 1 diabetes, paroxysmal A-fib relative, admitted with recurrent subacute nearly chronic diarrhea at this point; elevated fecal calprotectin; low fecal elastase  Principal Problem:   Diarrhea Active Problems:   Acute metabolic acidosis   Dehydration   History of cholecystectomy   AKI (acute kidney injury) (HCC)   Chronic diarrhea   Nonspecific abnormal finding in stool contents   Recommendations   1.  Diarrhea --improving but on 3 times a day cholestyramine and scheduled Lomotil.  Also recently started rifaximin. Low fecal elastase indicates possible pancreatic insufficiency but it should be noted that with some diarrheal processes this could be fictitiously low due to dilution The elevated fecal calprotectin has been persistent indicating inflammation though we do not have knowledge of small bowel Crohn's.  We know this is not microscopic colitis. --Add Creon 72,000 units with meals, 36,000 units with snacks -- Continue cholestyramine 3 times daily; if Creon is helpful back off on cholestyramine  -- Scheduled Lomotil until definitively improving -- Continue rifaximin -- Will not try multiple things at once but Entocort/budesonide would be next recommended treatment should she fail to fully improve   Chief Complaint   Patient reports less frequent, less voluminous and slightly more formed stool overnight Total bowel movements overnight 4; 10 yesterday during the day and evening No pain at rest Tolerating diet  Vital signs in last 24 hours: Temp:  [98 F (36.7 C)-98.6 F (37 C)] 98 F (36.7 C) (01/18 0307) Pulse Rate:  [67-76] 67 (01/18 0307) Resp:  [14-20] 14 (01/18 0307) BP: (136)/(68-71) 136/68 (01/18 0307) SpO2:  [98 %-100 %] 98 % (01/18 0307) Last BM Date : 02/17/23 Gen: awake, alert, NAD HEENT: anicteric  CV: RRR, soft murmur Pulm: CTA b/l Abd: soft, NT/ND, +BS throughout Ext: no c/c/e Neuro:  nonfocal  Intake/Output from previous day: 01/17 0701 - 01/18 0700 In: 1280.6 [P.O.:940; I.V.:340.6] Out: -  Intake/Output this shift: No intake/output data recorded.  Lab Results: Recent Labs    02/16/23 0407 02/17/23 0432 02/18/23 0433  WBC 5.2 5.0 5.0  HGB 9.9* 8.7* 9.3*  HCT 31.1* 28.0* 29.2*  PLT 264 217 226   BMET Recent Labs    02/16/23 0407 02/17/23 0432 02/18/23 0433  NA 136 138 138  K 3.1* 3.6 3.8  CL 114* 118* 118*  CO2 17* 14* 16*  GLUCOSE 137* 219* 86  BUN 21 14 15   CREATININE 0.69 0.61 0.47  CALCIUM 8.0* 7.9* 8.0*   LFT Recent Labs    02/18/23 0433  PROT 4.4*  ALBUMIN 2.5*  AST 19  ALT 16  ALKPHOS 38  BILITOT 0.4    Studies/Results: Fecal elastase low at 60 Stool Osm 429 Fecal cal 274 Fecal fat pending Stool Na pending   LOS: 2 days   Beverley Fiedler, MD 02/18/2023, 9:46 AM See Loretha Stapler, [facility], to contact our on call provider

## 2023-02-18 NOTE — Plan of Care (Signed)
  Problem: Education: Goal: Knowledge of General Education information will improve Description: Including pain rating scale, medication(s)/side effects and non-pharmacologic comfort measures Outcome: Progressing   Problem: Health Behavior/Discharge Planning: Goal: Ability to manage health-related needs will improve Outcome: Progressing   Problem: Clinical Measurements: Goal: Will remain free from infection Outcome: Progressing Goal: Diagnostic test results will improve Outcome: Progressing Goal: Respiratory complications will improve Outcome: Progressing Goal: Cardiovascular complication will be avoided Outcome: Progressing   Problem: Activity: Goal: Risk for activity intolerance will decrease Outcome: Progressing   Problem: Coping: Goal: Level of anxiety will decrease Outcome: Progressing   Problem: Elimination: Goal: Will not experience complications related to urinary retention Outcome: Progressing   Problem: Pain Management: Goal: General experience of comfort will improve Outcome: Progressing   Problem: Safety: Goal: Ability to remain free from injury will improve Outcome: Progressing   Problem: Skin Integrity: Goal: Risk for impaired skin integrity will decrease Outcome: Progressing   Problem: Clinical Measurements: Goal: Ability to maintain clinical measurements within normal limits will improve Outcome: Not Progressing   Problem: Nutrition: Goal: Adequate nutrition will be maintained Outcome: Not Progressing   Problem: Elimination: Goal: Will not experience complications related to bowel motility Outcome: Not Progressing

## 2023-02-18 NOTE — Progress Notes (Signed)
PROGRESS NOTE  Donna Wiley WGN:[id number] DOB: [birth year] DOA: 02/13/2023 PCP: Raquel James, MD   LOS: 2 days   Brief Narrative / Interim history: 83 year old female with history of DM1 on insulin pump, PAF on Xarelto, aortic stenosis comes into the hospital with several weeks of watery diarrhea.  She was hospitalized December 2020 for 4 profuse diarrhea, underwent GI workup with colonoscopy, infectious workup, all negative.  She was placed on loperamide as well as cholestyramine, improved and discharged home.  As she finished taking the medications, diarrhea returned and decided come back to the hospital.  Subjective / 24h Interval events: Feels like the number and the volume of her bowel movements are improving.  No nausea or vomiting  Assesement and Plan: Principal problem Subacute diarrheal illness -patient hospitalized in December with similar symptoms, underwent a colonoscopy on 12/13 which showed normal terminal ileum, normal colon, random biopsies taken and was negative for microscopic colitis -Recent workup was negative for stool studies -Symptoms improved with cholestyramine as well as Lomotil following most recent discharge, but as soon as she finished this diarrhea returned -Etiology not clear, GI consulted.  Started on rifaximin, continue.  She had a low fecal elastase suggesting pancreatic insufficiency but could be fictitiously low due to dilution per gastroenterology.  Regardless, she was started on Creon.  Continue to monitor  Active problems Diabetes mellitus, type I-continue insulin pump  CBG (last 3)  Recent Labs    02/17/23 2158 02/18/23 0254 02/18/23 0745  GLUCAP 115* 87 89    PAF-continue flecainide, Xarelto  Acute kidney injury non, anion gap metabolic acidosis-creatinine 2.8 on admission, creatinine normalized after fluids  Hypokalemia, hypomagnesemia-normalized  Hypophosphatemia-phosphorus improved after repletion  Mild normocytic  anemia-stable, no bleeding  Scheduled Meds:  cholestyramine light  4 g Oral TID   diphenoxylate-atropine  1 tablet Oral QID   feeding supplement  237 mL Oral TID BM   flecainide  50 mg Oral Daily   And   flecainide  100 mg Oral QHS   insulin pump   Subcutaneous TID WC, HS, 0200   lipase/protease/amylase  72,000 Units Oral TID WC   LORazepam  1 mg Oral QHS   rifaximin  550 mg Oral TID   rivaroxaban  15 mg Oral Q supper   sodium bicarbonate  650 mg Oral TID   Continuous Infusions:  sodium chloride 100 mL/hr at 02/18/23 0031   PRN Meds:.acetaminophen **OR** acetaminophen, ondansetron **OR** ondansetron (ZOFRAN) IV, mouth rinse  Current Outpatient Medications  Medication Instructions   acetaminophen (TYLENOL) 1,000 mg, Oral, Every 6 hours PRN   b complex vitamins capsule 1 capsule, Every evening   calcium citrate (CALCITRATE - DOSED IN MG ELEMENTAL CALCIUM) 950 (200 Ca) MG tablet 500 mg of elemental calcium, 2 times daily   cholestyramine (QUESTRAN) 4 g, Oral, 2 times daily   clobetasol cream (TEMOVATE) 0.05 % 1 Application, 2 times daily PRN   diltiazem (CARDIZEM CD) 180 mg, Nightly   EPINEPHrine (EPI-PEN) 0.3 mg, As needed   flecainide (TAMBOCOR) 50-100 mg, See admin instructions   insulin lispro (HUMALOG) 0-15 Units, 4 times daily   LORazepam (ATIVAN) 0.5 mg, Nightly   mirabegron ER (MYRBETRIQ) 50 mg, Daily   [Paused] olmesartan-hydrochlorothiazide (BENICAR HCT) 40-12.5 MG tablet 1 tablet, Every morning   ondansetron (ZOFRAN) 4 mg, Oral, Every 6 hours   pantoprazole (PROTONIX) 40 mg, Daily   rivaroxaban (XARELTO) 20 mg, Every evening   simvastatin (ZOCOR) 10 mg, Daily at bedtime    Diet  Orders (From admission, onward)     Start     Ordered   02/13/23 2312  Diet Carb Modified Fluid consistency: Thin; Room service appropriate? Yes  Diet effective now       Question Answer Comment  Diet-HS Snack? Snack-yes   Calorie Level Medium 1600-2000   Fluid consistency: Thin   Room  service appropriate? Yes      02/13/23 2311            DVT prophylaxis: Place and maintain sequential compression device Start: 02/14/23 1119 Rivaroxaban (XARELTO) tablet 15 mg   Lab Results  Component Value Date   PLT 226 02/18/2023      Code Status: Full Code  Family Communication: No family at bedside  Status is: Inpatient  Level of care: Telemetry  Consultants:  GI  Objective: Vitals:   02/17/23 0320 02/17/23 1151 02/17/23 2200 02/18/23 0307  BP: (!) 163/67 136/71 136/69 136/68  Pulse: 65 76 73 67  Resp: 19 16 20 14   Temp: 98.2 F (36.8 C) 98.6 F (37 C) 98.5 F (36.9 C) 98 F (36.7 C)  TempSrc: Oral Oral Oral Oral  SpO2: 99% 100% 100% 98%  Weight:      Height:        Intake/Output Summary (Last 24 hours) at 02/18/2023 1008 Last data filed at 02/17/2023 1859 Gross per 24 hour  Intake 1060.61 ml  Output --  Net 1060.61 ml   Wt Readings from Last 3 Encounters:  02/13/23 54.8 kg  02/04/23 61.2 kg  01/17/23 59.3 kg    Examination:  Constitutional: NAD Eyes: lids and conjunctivae normal, no scleral icterus ENMT: mmm Neck: normal, supple Respiratory: clear to auscultation bilaterally, no wheezing, no crackles. Normal respiratory effort.  Cardiovascular: Regular rate and rhythm, no murmurs / rubs / gallops. No LE edema. Abdomen: soft, no distention, no tenderness. Bowel sounds positive.    Data Reviewed: I have independently reviewed following labs and imaging studies   CBC Recent Labs  Lab 02/14/23 0415 02/15/23 0421 02/16/23 0407 02/17/23 0432 02/18/23 0433  WBC 3.8* 4.8 5.2 5.0 5.0  HGB 9.3* 9.5* 9.9* 8.7* 9.3*  HCT 28.6* 29.4* 31.1* 28.0* 29.2*  PLT 253 268 264 217 226  MCV 87.5 88.8 90.7 91.8 92.7  MCH 28.4 28.7 28.9 28.5 29.5  MCHC 32.5 32.3 31.8 31.1 31.8  RDW 13.5 13.9 13.9 14.2 14.3  LYMPHSABS  --  1.3  --   --   --   MONOABS  --  0.5  --   --   --   EOSABS  --  0.1  --   --   --   BASOSABS  --  0.0  --   --   --      Recent Labs  Lab 02/13/23 1506 02/13/23 1615 02/13/23 1635 02/13/23 1835 02/13/23 2220 02/14/23 0415 02/14/23 1141 02/15/23 0421 02/16/23 0407 02/17/23 0432 02/18/23 0433  NA 128*  --    < >  --    < > 135  --  137 136 138 138  K 3.9  --    < >  --    < > 3.5  --  3.6 3.1* 3.6 3.8  CL 103  --   --   --    < > 111  --  113* 114* 118* 118*  CO2 10*  --   --   --    < > 14*  --  15* 17* 14* 16*  GLUCOSE 306*  --   --   --    < >  210* 464* 279* 137* 219* 86  BUN 46*  --   --   --    < > 37*  --  27* 21 14 15   CREATININE 2.79*  --   --   --    < > 1.55*  --  1.01* 0.69 0.61 0.47  CALCIUM 8.4*  --   --   --    < > 7.8*  --  8.3* 8.0* 7.9* 8.0*  AST 21  --   --   --   --   --   --  21 21 19 19   ALT 21  --   --   --   --   --   --  19 18 16 16   ALKPHOS 54  --   --   --   --   --   --  46 48 38 38  BILITOT 0.7  --   --   --   --   --   --  0.7 0.5 0.3 0.4  ALBUMIN 3.4*  --   --   --   --   --   --  3.1* 2.9* 2.4* 2.5*  MG  --   --   --   --   --  1.6*  --  1.9 1.8 1.3* 1.9  LATICACIDVEN  --  1.6  --  0.9  --   --   --   --   --   --   --   TSH  --   --   --   --   --   --   --  1.264  --   --   --   HGBA1C  --   --   --   --   --   --   --  7.7*  --   --   --    < > = values in this interval not displayed.    ------------------------------------------------------------------------------------------------------------------ No results for input(s): "CHOL", "HDL", "LDLCALC", "TRIG", "CHOLHDL", "LDLDIRECT" in the last 72 hours.  Lab Results  Component Value Date   HGBA1C 7.7 (H) 02/15/2023   ------------------------------------------------------------------------------------------------------------------ No results for input(s): "TSH", "T4TOTAL", "T3FREE", "THYROIDAB" in the last 72 hours.  Invalid input(s): "FREET3"   Cardiac Enzymes No results for input(s): "CKMB", "TROPONINI", "MYOGLOBIN" in the last 168 hours.  Invalid input(s):  "CK" ------------------------------------------------------------------------------------------------------------------ No results found for: "BNP"  CBG: Recent Labs  Lab 02/17/23 1147 02/17/23 1628 02/17/23 2158 02/18/23 0254 02/18/23 0745  GLUCAP 132* 100* 115* 87 89    Recent Results (from the past 240 hours)  Gastrointestinal Panel by PCR , Stool     Status: None   Collection Time: 02/13/23  4:14 PM   Specimen: Stool  Result Value Ref Range Status   Campylobacter species NOT DETECTED NOT DETECTED Final   Plesimonas shigelloides NOT DETECTED NOT DETECTED Final   Salmonella species NOT DETECTED NOT DETECTED Final   Yersinia enterocolitica NOT DETECTED NOT DETECTED Final   Vibrio species NOT DETECTED NOT DETECTED Final   Vibrio cholerae NOT DETECTED NOT DETECTED Final   Enteroaggregative E coli (EAEC) NOT DETECTED NOT DETECTED Final   Enteropathogenic E coli (EPEC) NOT DETECTED NOT DETECTED Final   Enterotoxigenic E coli (ETEC) NOT DETECTED NOT DETECTED Final   Shiga like toxin producing E coli (STEC) NOT DETECTED NOT DETECTED Final   Shigella/Enteroinvasive E coli (EIEC) NOT DETECTED NOT DETECTED Final   Cryptosporidium NOT DETECTED NOT DETECTED Final  Cyclospora cayetanensis NOT DETECTED NOT DETECTED Final   Entamoeba histolytica NOT DETECTED NOT DETECTED Final   Giardia lamblia NOT DETECTED NOT DETECTED Final   Adenovirus F40/41 NOT DETECTED NOT DETECTED Final   Astrovirus NOT DETECTED NOT DETECTED Final   Norovirus GI/GII NOT DETECTED NOT DETECTED Final   Rotavirus A NOT DETECTED NOT DETECTED Final   Sapovirus (I, II, IV, and V) NOT DETECTED NOT DETECTED Final    Comment: Performed at [facility], [address], [place], [place] [address]  C Difficile Quick Screen w PCR reflex     Status: None   Collection Time: 02/13/23  4:15 PM   Specimen: Stool  Result Value Ref Range Status   C Diff antigen NEGATIVE NEGATIVE Final   C Diff toxin NEGATIVE NEGATIVE  Final   C Diff interpretation No C. difficile detected.  Final    Comment: Performed at [facility], [address], [place], [place] [address]  Calprotectin, Fecal     Status: Abnormal   Collection Time: 02/14/23  6:02 PM   Specimen: Stool  Result Value Ref Range Status   Calprotectin, Fecal 274 (H) 0 - 120 ug/g Final    Comment: (NOTE) Concentration     Interpretation   Follow-Up < 5 - 50 ug/g     Normal           None >50 -120 ug/g     Borderline       Re-evaluate in 4-6 weeks    >120 ug/g     Abnormal         Repeat as clinically                                   indicated Performed At: [facility] [address] [place], [place] [id number] Jolene Schimke MD WG:[id number]      Radiology Studies: No results found.   Pamella Pert, MD, PhD [facility]  Between 7 am - 7 pm I am available, please contact me via Amion (for emergencies) or Securechat (non urgent messages)  Between 7 pm - 7 am I am not available, please contact night coverage MD/APP via Amion

## 2023-02-18 NOTE — Plan of Care (Signed)

## 2023-02-18 NOTE — Progress Notes (Signed)
Mobility Specialist - Progress Note   02/18/23 1447  Mobility  Activity Ambulated independently in hallway  Level of Assistance Independent after set-up  Assistive Device None  Distance Ambulated (ft) 350 ft  Range of Motion/Exercises Active  Activity Response Tolerated well  Mobility Referral Yes  Mobility visit 1 Mobility  Mobility Specialist Start Time (ACUTE ONLY) 1437  Mobility Specialist Stop Time (ACUTE ONLY) 1447  Mobility Specialist Time Calculation (min) (ACUTE ONLY) 10 min   Pt was found in bed and agreeable to ambulate. No complaints with session. At EOS returned to bed with all needs met. Call bell in reach.  Billey Chang Mobility Specialist

## 2023-02-19 DIAGNOSIS — E8721 Acute metabolic acidosis: Secondary | ICD-10-CM | POA: Diagnosis not present

## 2023-02-19 DIAGNOSIS — R197 Diarrhea, unspecified: Secondary | ICD-10-CM | POA: Diagnosis not present

## 2023-02-19 DIAGNOSIS — R195 Other fecal abnormalities: Secondary | ICD-10-CM | POA: Diagnosis not present

## 2023-02-19 DIAGNOSIS — N179 Acute kidney failure, unspecified: Secondary | ICD-10-CM | POA: Diagnosis not present

## 2023-02-19 LAB — PHOSPHORUS: Phosphorus: 2.9 mg/dL (ref 2.5–4.6)

## 2023-02-19 LAB — CBC
HCT: 28.6 % — ABNORMAL LOW (ref 36.0–46.0)
Hemoglobin: 9 g/dL — ABNORMAL LOW (ref 12.0–15.0)
MCH: 28.4 pg (ref 26.0–34.0)
MCHC: 31.5 g/dL (ref 30.0–36.0)
MCV: 90.2 fL (ref 80.0–100.0)
Platelets: 226 10*3/uL (ref 150–400)
RBC: 3.17 MIL/uL — ABNORMAL LOW (ref 3.87–5.11)
RDW: 14.2 % (ref 11.5–15.5)
WBC: 5.3 10*3/uL (ref 4.0–10.5)
nRBC: 0 % (ref 0.0–0.2)

## 2023-02-19 LAB — GLUCOSE, CAPILLARY
Glucose-Capillary: 108 mg/dL — ABNORMAL HIGH (ref 70–99)
Glucose-Capillary: 60 mg/dL — ABNORMAL LOW (ref 70–99)
Glucose-Capillary: 145 mg/dL — ABNORMAL HIGH (ref 70–99)

## 2023-02-19 LAB — BASIC METABOLIC PANEL
Anion gap: 3 — ABNORMAL LOW (ref 5–15)
BUN: 20 mg/dL (ref 8–23)
CO2: 18 mmol/L — ABNORMAL LOW (ref 22–32)
Calcium: 8.1 mg/dL — ABNORMAL LOW (ref 8.9–10.3)
Chloride: 116 mmol/L — ABNORMAL HIGH (ref 98–111)
Creatinine, Ser: 0.64 mg/dL (ref 0.44–1.00)
GFR, Estimated: >60 mL/min (ref >60)
Glucose, Bld: 103 mg/dL — ABNORMAL HIGH (ref 70–99)
Potassium: 3.6 mmol/L (ref 3.5–5.1)
Sodium: 137 mmol/L (ref 135–145)

## 2023-02-19 LAB — MAGNESIUM: Magnesium: 1.7 mg/dL (ref 1.7–2.4)

## 2023-02-19 MED ORDER — POTASSIUM CHLORIDE CRYS ER 20 MEQ PO TBCR
40.0000 meq | EXTENDED_RELEASE_TABLET | Freq: Once | ORAL | Status: AC
  Administered 2023-02-19: 40 meq via ORAL
  Filled 2023-02-19: qty 2

## 2023-02-19 MED ORDER — DEXTROSE 50 % IV SOLN
50.0000 mL | INTRAVENOUS | Status: DC | PRN
  Administered 2023-02-20 (×2): 25 mL via INTRAVENOUS
  Filled 2023-02-19: qty 50

## 2023-02-19 MED ORDER — MAGNESIUM SULFATE 2 GM/50ML IV SOLN
2.0000 g | Freq: Once | INTRAVENOUS | Status: AC
  Administered 2023-02-19: 2 g via INTRAVENOUS
  Filled 2023-02-19: qty 50

## 2023-02-19 MED ORDER — POLYETHYLENE GLYCOL 3350 17 GM/SCOOP PO POWD
0.2500 | Freq: Once | ORAL | Status: AC
  Administered 2023-02-19: 64 g via ORAL
  Filled 2023-02-19: qty 255

## 2023-02-19 NOTE — Plan of Care (Signed)

## 2023-02-19 NOTE — Progress Notes (Signed)
    Progress Note   Assessment    83 year old with type 1 diabetes, paroxysmal A-fib relative, admitted with recurrent subacute nearly chronic diarrhea at this point; elevated fecal calprotectin; low fecal elastase    Principal Problem:   Diarrhea Active Problems:   Acute metabolic acidosis   Dehydration   History of cholecystectomy   AKI (acute kidney injury) (HCC)   Chronic diarrhea   Nonspecific abnormal finding in stool contents   Recommendations   Subacute diarrhea --improving but on 3 times daily cholestyramine and scheduled Lomotil.  Rifaximin in place for microbiome disruption and possibly IBS.  My suspicion for IBS overall is quite low given her age, the relative acuity of onset as well as the elevated fecal calprotectin.  Creon added yesterday because fecal elastase was low but again this may be somewhat related to dissolution. -- Given the fact that she remains hospitalized without great improvement, and the apparent inflammatory nature of the stool given elevated fecal calprotectin I recommend we do a pill camera while she is here -- Hold remaining doses of Lomotil and cholestyramine today; one quarter of a MiraLAX prep this evening -- Video pill camera study tomorrow morning; rule out Crohn's disease of the small bowel -- 4 to 6 hours after video pill swallowed resume other medications -- Liquids for dinner and n.p.o. after midnight  The nature of the procedure, as well as the risks, benefits, and alternatives were carefully and thoroughly reviewed with the patient. Ample time for discussion and questions allowed. The patient understood, was satisfied, and agreed to proceed.      Chief Complaint   Few are still bowel movements in the last 24 hours with scheduled cholestyramine and Lomotil She had a loose urgent watery bowel movement this morning leading to an accident; total bowel movements in the last 24 hours 9 which is down from previously No pain Tolerating  diet with the addition of Creon yesterday  Vital signs in last 24 hours: Temp:  [98.5 F (36.9 C)-99.6 F (37.6 C)] 98.6 F (37 C) (01/19 0634) Pulse Rate:  [69-91] 69 (01/19 0634) Resp:  [16-18] 18 (01/19 0634) BP: (119-148)/(65-70) 148/66 (01/19 0634) SpO2:  [96 %-100 %] 97 % (01/19 0634) Last BM Date : 02/18/23 Gen: awake, alert, NAD HEENT: anicteric  Abd: soft, NT/ND, +BS throughout Ext: no c/c/e Neuro: nonfocal  Intake/Output from previous day: 01/18 0701 - 01/19 0700 In: 360 [P.O.:360] Out: -  Intake/Output this shift: Total I/O In: 240 [P.O.:240] Out: -   Lab Results: Recent Labs    02/17/23 0432 02/18/23 0433 02/19/23 0417  WBC 5.0 5.0 5.3  HGB 8.7* 9.3* 9.0*  HCT 28.0* 29.2* 28.6*  PLT 217 226 226   BMET Recent Labs    02/17/23 0432 02/18/23 0433 02/19/23 0417  NA 138 138 137  K 3.6 3.8 3.6  CL 118* 118* 116*  CO2 14* 16* 18*  GLUCOSE 219* 86 103*  BUN 14 15 20   CREATININE 0.61 0.47 0.64  CALCIUM 7.9* 8.0* 8.1*   LFT Recent Labs    02/18/23 0433  PROT 4.4*  ALBUMIN 2.5*  AST 19  ALT 16  ALKPHOS 38  BILITOT 0.4      LOS: 3 days   Beverley Fiedler, MD 02/19/2023, 10:44 AM See Loretha Stapler, [facility], to contact our on call provider

## 2023-02-19 NOTE — Progress Notes (Signed)
PROGRESS NOTE  Donna Wiley SEG:[id number] DOB: 01/23/1941 DOA: 02/13/2023 PCP: Raquel James, MD   LOS: 3 days   Brief Narrative / Interim history: 83 year old female with history of DM1 on insulin pump, PAF on Xarelto, aortic stenosis comes into the hospital with several weeks of watery diarrhea.  She was hospitalized December 2020 for 4 profuse diarrhea, underwent GI workup with colonoscopy, infectious workup, all negative.  She was placed on loperamide as well as cholestyramine, improved and discharged home.  As she finished taking the medications, diarrhea returned and decided come back to the hospital.  Subjective / 24h Interval events: Had 4 bowel movements yesterday, no longer watery but still varies loose.  Feels dehydrated  Assesement and Plan: Principal problem Subacute diarrheal illness -patient hospitalized in December with similar symptoms, underwent a colonoscopy on 12/13 which showed normal terminal ileum, normal colon, random biopsies taken and was negative for microscopic colitis -Recent workup was negative for stool studies -Symptoms improved with cholestyramine as well as Lomotil following most recent discharge, but as soon as she finished this diarrhea returned -Etiology not clear, GI consulted.  Started on rifaximin, continue.  She had a low fecal elastase suggesting pancreatic insufficiency but could be fictitiously low due to dilution per gastroenterology.  Regardless, she was started on Creon.  Continue to monitor, she continues to feel different than baseline and with ongoing diarrhea  Active problems Diabetes mellitus, type I-continue insulin pump  CBG (last 3)  Recent Labs    02/18/23 2138 02/19/23 0413 02/19/23 0759  GLUCAP 64* 108* 60*    PAF-continue flecainide, Xarelto  Acute kidney injury non, anion gap metabolic acidosis-creatinine 2.8 on admission, creatinine normalized  Hypokalemia, hypomagnesemia-normal this morning, but give K and mag  2.4 and 2 respectively  Hypophosphatemia-phosphorus improved after repletion, normalized today  Mild normocytic anemia-stable, no bleeding  Scheduled Meds:  cholestyramine light  4 g Oral TID   diphenoxylate-atropine  1 tablet Oral QID   feeding supplement  237 mL Oral TID BM   flecainide  50 mg Oral Daily   And   flecainide  100 mg Oral QHS   insulin pump   Subcutaneous TID WC, HS, 0200   lipase/protease/amylase  72,000 Units Oral TID WC   LORazepam  1 mg Oral QHS   potassium chloride  40 mEq Oral Once   rifaximin  550 mg Oral TID   rivaroxaban  15 mg Oral Q supper   sodium bicarbonate  650 mg Oral TID   Continuous Infusions:  sodium chloride 100 mL/hr at 02/18/23 0031   magnesium sulfate bolus IVPB     PRN Meds:.acetaminophen **OR** acetaminophen, ondansetron **OR** ondansetron (ZOFRAN) IV, mouth rinse  Current Outpatient Medications  Medication Instructions   acetaminophen (TYLENOL) 1,000 mg, Oral, Every 6 hours PRN   b complex vitamins capsule 1 capsule, Every evening   calcium citrate (CALCITRATE - DOSED IN MG ELEMENTAL CALCIUM) 950 (200 Ca) MG tablet 500 mg of elemental calcium, 2 times daily   cholestyramine (QUESTRAN) 4 g, Oral, 2 times daily   clobetasol cream (TEMOVATE) 0.05 % 1 Application, 2 times daily PRN   diltiazem (CARDIZEM CD) 180 mg, Nightly   EPINEPHrine (EPI-PEN) 0.3 mg, As needed   flecainide (TAMBOCOR) 50-100 mg, See admin instructions   insulin lispro (HUMALOG) 0-15 Units, 4 times daily   LORazepam (ATIVAN) 0.5 mg, Nightly   mirabegron ER (MYRBETRIQ) 50 mg, Daily   [Paused] olmesartan-hydrochlorothiazide (BENICAR HCT) 40-12.5 MG tablet 1 tablet, Every morning  ondansetron (ZOFRAN) 4 mg, Oral, Every 6 hours   pantoprazole (PROTONIX) 40 mg, Daily   rivaroxaban (XARELTO) 20 mg, Every evening   simvastatin (ZOCOR) 10 mg, Daily at bedtime    Diet Orders (From admission, onward)     Start     Ordered   02/13/23 2312  Diet Carb Modified Fluid  consistency: Thin; Room service appropriate? Yes  Diet effective now       Question Answer Comment  Diet-HS Snack? Snack-yes   Calorie Level Medium 1600-2000   Fluid consistency: Thin   Room service appropriate? Yes      02/13/23 2311            DVT prophylaxis: Place and maintain sequential compression device Start: 02/14/23 1119 Rivaroxaban (XARELTO) tablet 15 mg   Lab Results  Component Value Date   PLT 226 02/19/2023      Code Status: Full Code  Family Communication: No family at bedside  Status is: Inpatient  Level of care: Telemetry  Consultants:  GI  Objective: Vitals:   02/18/23 1121 02/18/23 2103 02/18/23 2103 02/19/23 0634  BP: (!) 148/65 119/70 119/70 (!) 148/66  Pulse: 75 91 91 69  Resp: 16 18 18 18   Temp: 98.5 F (36.9 C) 99.6 F (37.6 C) 99.6 F (37.6 C) 98.6 F (37 C)  TempSrc: Oral Oral Oral Oral  SpO2: 100% 96% 96% 97%  Weight:      Height:        Intake/Output Summary (Last 24 hours) at 02/19/2023 1016 Last data filed at 02/19/2023 1610 Gross per 24 hour  Intake 600 ml  Output --  Net 600 ml   Wt Readings from Last 3 Encounters:  02/13/23 54.8 kg  02/04/23 61.2 kg  01/17/23 59.3 kg    Examination:  Constitutional: NAD Eyes: lids and conjunctivae normal, no scleral icterus ENMT: mmm Neck: normal, supple Respiratory: clear to auscultation bilaterally, no wheezing, no crackles. Normal respiratory effort.  Cardiovascular: Regular rate and rhythm, no murmurs / rubs / gallops. No LE edema. Abdomen: soft, no distention, no tenderness. Bowel sounds positive.    Data Reviewed: I have independently reviewed following labs and imaging studies   CBC Recent Labs  Lab 02/15/23 0421 02/16/23 0407 02/17/23 0432 02/18/23 0433 02/19/23 0417  WBC 4.8 5.2 5.0 5.0 5.3  HGB 9.5* 9.9* 8.7* 9.3* 9.0*  HCT 29.4* 31.1* 28.0* 29.2* 28.6*  PLT 268 264 217 226 226  MCV 88.8 90.7 91.8 92.7 90.2  MCH 28.7 28.9 28.5 29.5 28.4  MCHC 32.3 31.8  31.1 31.8 31.5  RDW 13.9 13.9 14.2 14.3 14.2  LYMPHSABS 1.3  --   --   --   --   MONOABS 0.5  --   --   --   --   EOSABS 0.1  --   --   --   --   BASOSABS 0.0  --   --   --   --     Recent Labs  Lab 02/13/23 1506 02/13/23 1615 02/13/23 1635 02/13/23 1835 02/13/23 2220 02/15/23 0421 02/16/23 0407 02/17/23 0432 02/18/23 0433 02/19/23 0417  NA 128*  --    < >  --    < > 137 136 138 138 137  K 3.9  --    < >  --    < > 3.6 3.1* 3.6 3.8 3.6  CL 103  --   --   --    < > 113* 114* 118* 118* 116*  CO2 10*  --   --   --    < > 15* 17* 14* 16* 18*  GLUCOSE 306*  --   --   --    < > 279* 137* 219* 86 103*  BUN 46*  --   --   --    < > 27* 21 14 15 20   CREATININE 2.79*  --   --   --    < > 1.01* 0.69 0.61 0.47 0.64  CALCIUM 8.4*  --   --   --    < > 8.3* 8.0* 7.9* 8.0* 8.1*  AST 21  --   --   --   --  21 21 19 19   --   ALT 21  --   --   --   --  19 18 16 16   --   ALKPHOS 54  --   --   --   --  46 48 38 38  --   BILITOT 0.7  --   --   --   --  0.7 0.5 0.3 0.4  --   ALBUMIN 3.4*  --   --   --   --  3.1* 2.9* 2.4* 2.5*  --   MG  --   --   --   --    < > 1.9 1.8 1.3* 1.9 1.7  LATICACIDVEN  --  1.6  --  0.9  --   --   --   --   --   --   TSH  --   --   --   --   --  1.264  --   --   --   --   HGBA1C  --   --   --   --   --  7.7*  --   --   --   --    < > = values in this interval not displayed.    ------------------------------------------------------------------------------------------------------------------ No results for input(s): "CHOL", "HDL", "LDLCALC", "TRIG", "CHOLHDL", "LDLDIRECT" in the last 72 hours.  Lab Results  Component Value Date   HGBA1C 7.7 (H) 02/15/2023   ------------------------------------------------------------------------------------------------------------------ No results for input(s): "TSH", "T4TOTAL", "T3FREE", "THYROIDAB" in the last 72 hours.  Invalid input(s): "FREET3"   Cardiac Enzymes No results for input(s): "CKMB", "TROPONINI", "MYOGLOBIN" in  the last 168 hours.  Invalid input(s): "CK" ------------------------------------------------------------------------------------------------------------------ No results found for: "BNP"  CBG: Recent Labs  Lab 02/18/23 1119 02/18/23 1721 02/18/23 2138 02/19/23 0413 02/19/23 0759  GLUCAP 107* 209* 64* 108* 60*    Recent Results (from the past 240 hours)  Gastrointestinal Panel by PCR , Stool     Status: None   Collection Time: 02/13/23  4:14 PM   Specimen: Stool  Result Value Ref Range Status   Campylobacter species NOT DETECTED NOT DETECTED Final   Plesimonas shigelloides NOT DETECTED NOT DETECTED Final   Salmonella species NOT DETECTED NOT DETECTED Final   Yersinia enterocolitica NOT DETECTED NOT DETECTED Final   Vibrio species NOT DETECTED NOT DETECTED Final   Vibrio cholerae NOT DETECTED NOT DETECTED Final   Enteroaggregative E coli (EAEC) NOT DETECTED NOT DETECTED Final   Enteropathogenic E coli (EPEC) NOT DETECTED NOT DETECTED Final   Enterotoxigenic E coli (ETEC) NOT DETECTED NOT DETECTED Final   Shiga like toxin producing E coli (STEC) NOT DETECTED NOT DETECTED Final   Shigella/Enteroinvasive E coli (EIEC) NOT DETECTED NOT DETECTED Final   Cryptosporidium NOT DETECTED NOT DETECTED Final  Cyclospora cayetanensis NOT DETECTED NOT DETECTED Final   Entamoeba histolytica NOT DETECTED NOT DETECTED Final   Giardia lamblia NOT DETECTED NOT DETECTED Final   Adenovirus F40/41 NOT DETECTED NOT DETECTED Final   Astrovirus NOT DETECTED NOT DETECTED Final   Norovirus GI/GII NOT DETECTED NOT DETECTED Final   Rotavirus A NOT DETECTED NOT DETECTED Final   Sapovirus (I, II, IV, and V) NOT DETECTED NOT DETECTED Final    Comment: Performed at [facility], [address] [place], [place] [address]  C Difficile Quick Screen w PCR reflex     Status: None   Collection Time: 02/13/23  4:15 PM   Specimen: Stool  Result Value Ref Range Status   C Diff antigen NEGATIVE NEGATIVE  Final   C Diff toxin NEGATIVE NEGATIVE Final   C Diff interpretation No C. difficile detected.  Final    Comment: Performed at [facility], [address], [place], [place] [address]  Calprotectin, Fecal     Status: Abnormal   Collection Time: 02/14/23  6:02 PM   Specimen: Stool  Result Value Ref Range Status   Calprotectin, Fecal 274 (H) 0 - 120 ug/g Final    Comment: (NOTE) Concentration     Interpretation   Follow-Up < 5 - 50 ug/g     Normal           None >50 -120 ug/g     Borderline       Re-evaluate in 4-6 weeks    >120 ug/g     Abnormal         Repeat as clinically                                   indicated Performed At: [facility] [address] [place], [place] [id number] Jolene Schimke MD VZ:[id number]      Radiology Studies: No results found.   Pamella Pert, MD, PhD [facility]  Between 7 am - 7 pm I am available, please contact me via Amion (for emergencies) or Securechat (non urgent messages)  Between 7 pm - 7 am I am not available, please contact night coverage MD/APP via Amion

## 2023-02-20 ENCOUNTER — Encounter (HOSPITAL_COMMUNITY)
Admission: EM | Disposition: A | Discharge status: Home / Self Care | Payer: Self-pay | Source: Home / Self Care | Attending: Internal Medicine

## 2023-02-20 ENCOUNTER — Encounter (HOSPITAL_COMMUNITY): Payer: Self-pay | Admitting: Internal Medicine

## 2023-02-20 DIAGNOSIS — R195 Other fecal abnormalities: Secondary | ICD-10-CM | POA: Diagnosis not present

## 2023-02-20 DIAGNOSIS — R197 Diarrhea, unspecified: Secondary | ICD-10-CM | POA: Diagnosis not present

## 2023-02-20 DIAGNOSIS — E8721 Acute metabolic acidosis: Secondary | ICD-10-CM | POA: Diagnosis not present

## 2023-02-20 DIAGNOSIS — D649 Anemia, unspecified: Secondary | ICD-10-CM | POA: Diagnosis not present

## 2023-02-20 HISTORY — PX: GIVENS CAPSULE STUDY: SHX5432

## 2023-02-20 LAB — CBC
HCT: 28.6 % — ABNORMAL LOW (ref 36.0–46.0)
Hemoglobin: 9 g/dL — ABNORMAL LOW (ref 12.0–15.0)
MCH: 28.6 pg (ref 26.0–34.0)
MCHC: 31.5 g/dL (ref 30.0–36.0)
MCV: 90.8 fL (ref 80.0–100.0)
Platelets: 232 10*3/uL (ref 150–400)
RBC: 3.15 MIL/uL — ABNORMAL LOW (ref 3.87–5.11)
RDW: 14.2 % (ref 11.5–15.5)
WBC: 5.8 10*3/uL (ref 4.0–10.5)
nRBC: 0 % (ref 0.0–0.2)

## 2023-02-20 LAB — BASIC METABOLIC PANEL
Anion gap: 6 (ref 5–15)
BUN: 16 mg/dL (ref 8–23)
CO2: 19 mmol/L — ABNORMAL LOW (ref 22–32)
Calcium: 8.4 mg/dL — ABNORMAL LOW (ref 8.9–10.3)
Chloride: 116 mmol/L — ABNORMAL HIGH (ref 98–111)
Creatinine, Ser: 0.71 mg/dL (ref 0.44–1.00)
GFR, Estimated: >60 mL/min (ref >60)
Glucose, Bld: 40 mg/dL — CL (ref 70–99)
Potassium: 3.7 mmol/L (ref 3.5–5.1)
Sodium: 141 mmol/L (ref 135–145)

## 2023-02-20 LAB — GLUCOSE, CAPILLARY
Glucose-Capillary: 164 mg/dL — ABNORMAL HIGH (ref 70–99)
Glucose-Capillary: 69 mg/dL — ABNORMAL LOW (ref 70–99)
Glucose-Capillary: 53 mg/dL — ABNORMAL LOW (ref 70–99)
Glucose-Capillary: 93 mg/dL (ref 70–99)
Glucose-Capillary: 179 mg/dL — ABNORMAL HIGH (ref 70–99)
Glucose-Capillary: 53 mg/dL — ABNORMAL LOW (ref 70–99)
Glucose-Capillary: 66 mg/dL — ABNORMAL LOW (ref 70–99)
Glucose-Capillary: 101 mg/dL — ABNORMAL HIGH (ref 70–99)
Glucose-Capillary: 156 mg/dL — ABNORMAL HIGH (ref 70–99)

## 2023-02-20 LAB — FECAL FAT, QUALITATIVE
Fat Qual Neutral, Stl: INCREASED
Fat Qual Total, Stl: INCREASED

## 2023-02-20 LAB — MAGNESIUM: Magnesium: 1.9 mg/dL (ref 1.7–2.4)

## 2023-02-20 LAB — PHOSPHORUS: Phosphorus: 2.8 mg/dL (ref 2.5–4.6)

## 2023-02-20 SURGERY — IMAGING PROCEDURE, GI TRACT, INTRALUMINAL, VIA CAPSULE
Anesthesia: LOCAL

## 2023-02-20 MED ORDER — POTASSIUM CHLORIDE CRYS ER 20 MEQ PO TBCR
30.0000 meq | EXTENDED_RELEASE_TABLET | Freq: Once | ORAL | Status: AC
  Administered 2023-02-20: 30 meq via ORAL
  Filled 2023-02-20: qty 1

## 2023-02-20 MED ORDER — DEXTROSE-SODIUM CHLORIDE 5-0.9 % IV SOLN: INTRAVENOUS | Status: AC

## 2023-02-20 SURGICAL SUPPLY — 1 items: TOWEL COTTON PACK 4EA (MISCELLANEOUS) ×2 IMPLANT

## 2023-02-20 NOTE — Progress Notes (Signed)
PROGRESS NOTE  Donna Wiley ZOX:[id number] DOB: 01-08-1941 DOA: 02/13/2023 PCP: Raquel James, MD   LOS: 4 days   Brief Narrative / Interim history: 83 year old female with history of DM1 on insulin pump, PAF on Xarelto, aortic stenosis comes into the hospital with several weeks of watery diarrhea.  She was hospitalized December 2020 for 4 profuse diarrhea, underwent GI workup with colonoscopy, infectious workup, all negative.  She was placed on loperamide as well as cholestyramine, improved and discharged home.  As she finished taking the medications, diarrhea returned and decided come back to the hospital.  Subjective / 24h Interval events: Doing well, about 2 get capsule study underway  Assesement and Plan: Principal problem Subacute diarrheal illness -patient hospitalized in December with similar symptoms, underwent a colonoscopy on 12/13 which showed normal terminal ileum, normal colon, random biopsies taken and was negative for microscopic colitis -Recent workup was negative for stool studies -Symptoms improved with cholestyramine as well as Lomotil following most recent discharge, but as soon as she finished this diarrhea returned -Etiology not clear, GI consulted.  Started on rifaximin, continue.  She had a low fecal elastase suggesting pancreatic insufficiency but could be fictitiously low due to dilution per gastroenterology.  Regardless, she was started on Creon.  Continue to monitor, capsule study today per gastroenterology  Active problems Diabetes mellitus, type I-continue insulin pump  CBG (last 3)  Recent Labs    02/20/23 0512 02/20/23 0644 02/20/23 0745  GLUCAP 69* 53* 93    PAF-continue flecainide, Xarelto  Acute kidney injury non, anion gap metabolic acidosis-creatinine 2.8 on admission, creatinine remains normal  Hypokalemia, hypomagnesemia-lites normalized  Hypophosphatemia-phosphorus improved after repletion, normal today  Mild normocytic  anemia-stable, no bleeding  Scheduled Meds:  cholestyramine light  4 g Oral TID   diphenoxylate-atropine  1 tablet Oral QID   feeding supplement  237 mL Oral TID BM   flecainide  50 mg Oral Daily   And   flecainide  100 mg Oral QHS   insulin pump   Subcutaneous TID WC, HS, 0200   lipase/protease/amylase  72,000 Units Oral TID WC   LORazepam  1 mg Oral QHS   rifaximin  550 mg Oral TID   rivaroxaban  15 mg Oral Q supper   sodium bicarbonate  650 mg Oral TID   Continuous Infusions:  sodium chloride 75 mL/hr at 02/20/23 0538   dextrose 5 % and 0.9 % NaCl 25 mL/hr at 02/20/23 0538   PRN Meds:.acetaminophen **OR** acetaminophen, dextrose, ondansetron **OR** ondansetron (ZOFRAN) IV, mouth rinse  Current Outpatient Medications  Medication Instructions   acetaminophen (TYLENOL) 1,000 mg, Oral, Every 6 hours PRN   b complex vitamins capsule 1 capsule, Every evening   calcium citrate (CALCITRATE - DOSED IN MG ELEMENTAL CALCIUM) 950 (200 Ca) MG tablet 500 mg of elemental calcium, 2 times daily   cholestyramine (QUESTRAN) 4 g, Oral, 2 times daily   clobetasol cream (TEMOVATE) 0.05 % 1 Application, 2 times daily PRN   diltiazem (CARDIZEM CD) 180 mg, Nightly   EPINEPHrine (EPI-PEN) 0.3 mg, As needed   flecainide (TAMBOCOR) 50-100 mg, See admin instructions   insulin lispro (HUMALOG) 0-15 Units, 4 times daily   LORazepam (ATIVAN) 0.5 mg, Nightly   mirabegron ER (MYRBETRIQ) 50 mg, Daily   [Paused] olmesartan-hydrochlorothiazide (BENICAR HCT) 40-12.5 MG tablet 1 tablet, Every morning   ondansetron (ZOFRAN) 4 mg, Oral, Every 6 hours   pantoprazole (PROTONIX) 40 mg, Daily   rivaroxaban (XARELTO) 20 mg, Every evening  simvastatin (ZOCOR) 10 mg, Daily at bedtime    Diet Orders (From admission, onward)     Start     Ordered   02/20/23 0001  Diet NPO time specified Except for: Sips with Meds  Diet effective midnight       Question:  Except for  Answer:  Sips with Meds   02/19/23 1053             DVT prophylaxis: Place and maintain sequential compression device Start: 02/14/23 1119 Rivaroxaban (XARELTO) tablet 15 mg   Lab Results  Component Value Date   PLT 232 02/20/2023      Code Status: Full Code  Family Communication: No family at bedside  Status is: Inpatient  Level of care: Telemetry  Consultants:  GI  Objective: Vitals:   02/19/23 1235 02/19/23 1440 02/19/23 2052 02/20/23 0438  BP: (!) 144/64 (!) 143/64 (!) 166/85 (!) 139/57  Pulse: 70 75 78 75  Resp: 14 14 14 14   Temp: 98 F (36.7 C) 98.7 F (37.1 C) 98.5 F (36.9 C) (!) 97.4 F (36.3 C)  TempSrc: Oral Oral Oral Oral  SpO2: 98% 97% 100% 96%  Weight:      Height:        Intake/Output Summary (Last 24 hours) at 02/20/2023 1055 Last data filed at 02/20/2023 0800 Gross per 24 hour  Intake 240 ml  Output --  Net 240 ml   Wt Readings from Last 3 Encounters:  02/13/23 54.8 kg  02/04/23 61.2 kg  01/17/23 59.3 kg    Examination:  Constitutional: NAD Eyes: lids and conjunctivae normal, no scleral icterus ENMT: mmm Neck: normal, supple Respiratory: clear to auscultation bilaterally, no wheezing, no crackles. Normal respiratory effort.  Cardiovascular: Regular rate and rhythm, no murmurs / rubs / gallops. No LE edema. Abdomen: soft, no distention, no tenderness. Bowel sounds positive.    Data Reviewed: I have independently reviewed following labs and imaging studies   CBC Recent Labs  Lab 02/15/23 0421 02/16/23 0407 02/17/23 0432 02/18/23 0433 02/19/23 0417 02/20/23 0303  WBC 4.8 5.2 5.0 5.0 5.3 5.8  HGB 9.5* 9.9* 8.7* 9.3* 9.0* 9.0*  HCT 29.4* 31.1* 28.0* 29.2* 28.6* 28.6*  PLT 268 264 217 226 226 232  MCV 88.8 90.7 91.8 92.7 90.2 90.8  MCH 28.7 28.9 28.5 29.5 28.4 28.6  MCHC 32.3 31.8 31.1 31.8 31.5 31.5  RDW 13.9 13.9 14.2 14.3 14.2 14.2  LYMPHSABS 1.3  --   --   --   --   --   MONOABS 0.5  --   --   --   --   --   EOSABS 0.1  --   --   --   --   --   BASOSABS 0.0  --    --   --   --   --     Recent Labs  Lab 02/13/23 1506 02/13/23 1615 02/13/23 1635 02/13/23 1835 02/13/23 2220 02/15/23 0421 02/16/23 0407 02/17/23 0432 02/18/23 0433 02/19/23 0417 02/20/23 0303  NA 128*  --    < >  --    < > 137 136 138 138 137 141  K 3.9  --    < >  --    < > 3.6 3.1* 3.6 3.8 3.6 3.7  CL 103  --   --   --    < > 113* 114* 118* 118* 116* 116*  CO2 10*  --   --   --    < >  15* 17* 14* 16* 18* 19*  GLUCOSE 306*  --   --   --    < > 279* 137* 219* 86 103* 40*  BUN 46*  --   --   --    < > 27* 21 14 15 20 16   CREATININE 2.79*  --   --   --    < > 1.01* 0.69 0.61 0.47 0.64 0.71  CALCIUM 8.4*  --   --   --    < > 8.3* 8.0* 7.9* 8.0* 8.1* 8.4*  AST 21  --   --   --   --  21 21 19 19   --   --   ALT 21  --   --   --   --  19 18 16 16   --   --   ALKPHOS 54  --   --   --   --  46 48 38 38  --   --   BILITOT 0.7  --   --   --   --  0.7 0.5 0.3 0.4  --   --   ALBUMIN 3.4*  --   --   --   --  3.1* 2.9* 2.4* 2.5*  --   --   MG  --   --   --   --    < > 1.9 1.8 1.3* 1.9 1.7 1.9  LATICACIDVEN  --  1.6  --  0.9  --   --   --   --   --   --   --   TSH  --   --   --   --   --  1.264  --   --   --   --   --   HGBA1C  --   --   --   --   --  7.7*  --   --   --   --   --    < > = values in this interval not displayed.    ------------------------------------------------------------------------------------------------------------------ No results for input(s): "CHOL", "HDL", "LDLCALC", "TRIG", "CHOLHDL", "LDLDIRECT" in the last 72 hours.  Lab Results  Component Value Date   HGBA1C 7.7 (H) 02/15/2023   ------------------------------------------------------------------------------------------------------------------ No results for input(s): "TSH", "T4TOTAL", "T3FREE", "THYROIDAB" in the last 72 hours.  Invalid input(s): "FREET3"   Cardiac Enzymes No results for input(s): "CKMB", "TROPONINI", "MYOGLOBIN" in the last 168 hours.  Invalid input(s):  "CK" ------------------------------------------------------------------------------------------------------------------ No results found for: "BNP"  CBG: Recent Labs  Lab 02/19/23 1528 02/20/23 0433 02/20/23 0512 02/20/23 0644 02/20/23 0745  GLUCAP 145* 164* 69* 53* 93    Recent Results (from the past 240 hours)  Gastrointestinal Panel by PCR , Stool     Status: None   Collection Time: 02/13/23  4:14 PM   Specimen: Stool  Result Value Ref Range Status   Campylobacter species NOT DETECTED NOT DETECTED Final   Plesimonas shigelloides NOT DETECTED NOT DETECTED Final   Salmonella species NOT DETECTED NOT DETECTED Final   Yersinia enterocolitica NOT DETECTED NOT DETECTED Final   Vibrio species NOT DETECTED NOT DETECTED Final   Vibrio cholerae NOT DETECTED NOT DETECTED Final   Enteroaggregative E coli (EAEC) NOT DETECTED NOT DETECTED Final   Enteropathogenic E coli (EPEC) NOT DETECTED NOT DETECTED Final   Enterotoxigenic E coli (ETEC) NOT DETECTED NOT DETECTED Final   Shiga like toxin producing E coli (STEC) NOT DETECTED NOT DETECTED Final   Shigella/Enteroinvasive E coli (  EIEC) NOT DETECTED NOT DETECTED Final   Cryptosporidium NOT DETECTED NOT DETECTED Final   Cyclospora cayetanensis NOT DETECTED NOT DETECTED Final   Entamoeba histolytica NOT DETECTED NOT DETECTED Final   Giardia lamblia NOT DETECTED NOT DETECTED Final   Adenovirus F40/41 NOT DETECTED NOT DETECTED Final   Astrovirus NOT DETECTED NOT DETECTED Final   Norovirus GI/GII NOT DETECTED NOT DETECTED Final   Rotavirus A NOT DETECTED NOT DETECTED Final   Sapovirus (I, II, IV, and V) NOT DETECTED NOT DETECTED Final    Comment: Performed at [facility], [address], [place], [place] [address]  C Difficile Quick Screen w PCR reflex     Status: None   Collection Time: 02/13/23  4:15 PM   Specimen: Stool  Result Value Ref Range Status   C Diff antigen NEGATIVE NEGATIVE Final   C Diff toxin NEGATIVE NEGATIVE  Final   C Diff interpretation No C. difficile detected.  Final    Comment: Performed at [facility], [address] [place], [place] [address]  Calprotectin, Fecal     Status: Abnormal   Collection Time: 02/14/23  6:02 PM   Specimen: Stool  Result Value Ref Range Status   Calprotectin, Fecal 274 (H) 0 - 120 ug/g Final    Comment: (NOTE) Concentration     Interpretation   Follow-Up < 5 - 50 ug/g     Normal           None >50 -120 ug/g     Borderline       Re-evaluate in 4-6 weeks    >120 ug/g     Abnormal         Repeat as clinically                                   indicated Performed At: [facility] [address] [place], [place] [id number] Jolene Schimke MD OZ:[id number]      Radiology Studies: No results found.   Pamella Pert, MD, PhD [facility]  Between 7 am - 7 pm I am available, please contact me via Amion (for emergencies) or Securechat (non urgent messages)  Between 7 pm - 7 am I am not available, please contact night coverage MD/APP via Amion

## 2023-02-20 NOTE — Progress Notes (Addendum)
Attending physician's note   I have taken a history, reviewed the chart, and examined the patient. I performed a substantive portion of this encounter, including complete performance of at least one of the key components, in conjunction with the APP. I agree with the APP's note, impression, and recommendations with my edits.   VCE placed this morning and currently recording.  Will plan to read capsule tomorrow to evaluate for small bowel pathology contributing to her ongoing diarrhea.  Will resume medications today.  Diet today per VCE protocol.  Kirubel Aja, DO, FACG [phone] office         [facility] Progress Note  CC: Diarrhea  Subjective: Patient swallowed Givens capsule at 7:55 AM.  She endorsed passing at least 10 nonbloody diarrhea bowel movements overnight after taking MiraLAX prep for capsule endoscopy.  She denies having any nausea, vomiting or abdominal pain.  She is ambulating up in the room.  No chest pain or shortness of breath.  Objective:  Vital signs in last 24 hours: Temp:  [97.4 F (36.3 C)-98.7 F (37.1 C)] 97.4 F (36.3 C) (01/20 0438) Pulse Rate:  [70-78] 75 (01/20 0438) Resp:  [14] 14 (01/20 0438) BP: (139-166)/(57-85) 139/57 (01/20 0438) SpO2:  [96 %-100 %] 96 % (01/20 0438) Last BM Date : 02/19/23 General: Alert 83 year old female in no acute distress. Heart: Regular rate and rhythm, no murmurs. Pulm: Breath sounds clear throughout. Abdomen: Soft, nondistended.  Nontender.  Positive bowel sounds to all 4 quadrants. Extremities: No lower extremity edema. Neurologic:  Alert and  oriented x 4. Grossly normal neurologically. Psych:  Alert and cooperative. Normal mood and affect.  Intake/Output from previous day: 01/19 0701 - 01/20 0700 In: 480 [P.O.:480] Out: -  Intake/Output this shift: No intake/output data recorded.  Lab Results: Recent Labs    02/18/23 0433 02/19/23 0417 02/20/23 0303  WBC 5.0 5.3 5.8  HGB  9.3* 9.0* 9.0*  HCT 29.2* 28.6* 28.6*  PLT 226 226 232   BMET Recent Labs    02/18/23 0433 02/19/23 0417 02/20/23 0303  NA 138 137 141  K 3.8 3.6 3.7  CL 118* 116* 116*  CO2 16* 18* 19*  GLUCOSE 86 103* 40*  BUN 15 20 16   CREATININE 0.47 0.64 0.71  CALCIUM 8.0* 8.1* 8.4*   LFT Recent Labs    02/18/23 0433  PROT 4.4*  ALBUMIN 2.5*  AST 19  ALT 16  ALKPHOS 38  BILITOT 0.4   PT/INR No results for input(s): "LABPROT", "INR" in the last 72 hours. Hepatitis Panel No results for input(s): "HEPBSAG", "HCVAB", "HEPAIGM", "HEPBIGM" in the last 72 hours.  No results found.  Assessment / Plan:  83 year old with type 1 diabetes, paroxysmal A-fib relative, admitted with recurrent subacute nearly chronic diarrhea at this point. Elevated fecal calprotectin. Low fecal elastase.  Negative GI pathogen panel and C. difficile.  Normal TSH.  Negative celiac panel.  CTAP 02/13/2023 showed fluid with scattered air-fluid levels throughout the colon without bowel wall inflammation, thickening or bowel obstruction.  Small bowel capsule endoscopy to rule out small bowel Crohn's disease in process, results pending.  Hemodynamically stable. -Await small bowel capsule endoscopy results -Per small bowel capsule instructions, patient to remain NPO until 0955 at which time they may progress to clear liquid diet. At 1155 patient may have a small snack such as a half a sandwich or a bowl of soup. At 1555 patient may progress to previously ordered diet.  -Continue Rifaximin 3  times daily and Creon 3 times daily with meals -Restart Cholestyramine at 2pm and Lomotil at 4pm  Normocytic anemia. Stable Hg 9.0. No overt GI bleeding.  -CBC, Iron, TIBC and ferritin panel in am     Principal Problem:   Diarrhea Active Problems:   Acute metabolic acidosis   Dehydration   History of cholecystectomy   AKI (acute kidney injury) (HCC)   Chronic diarrhea   Nonspecific abnormal finding in stool contents      LOS: 4 days   Arnaldo Natal  02/20/2023, 9:57AM

## 2023-02-20 NOTE — Inpatient Diabetes Management (Signed)
Inpatient Diabetes Program Recommendations  AACE/ADA: New Consensus Statement on Inpatient Glycemic Control (2015)  Target Ranges:  Prepandial:   less than 140 mg/dL      Peak postprandial:   less than 180 mg/dL (1-2 hours)      Critically ill patients:  140 - 180 mg/dL    Latest Reference Range & Units 02/18/23 02:54 02/18/23 07:45 02/18/23 11:19 02/18/23 17:21 02/18/23 21:38  Glucose-Capillary 70 - 99 mg/dL 87 89 045 (H) 409 (H) 64 (L)  (H): Data is abnormally high (L): Data is abnormally low  Latest Reference Range & Units 02/19/23 04:13 02/19/23 07:59 02/19/23 15:28  Glucose-Capillary 70 - 99 mg/dL 811 (H) 60 (L) 914 (H)    Latest Reference Range & Units 02/20/23 04:33 02/20/23 05:12 02/20/23 06:44 02/20/23 07:45 02/20/23 12:25  Glucose-Capillary 70 - 99 mg/dL 782 (H) 69 (L) 53 (L) 93 179 (H)  (H): Data is abnormally high (L): Data is abnormally low    Admit with: Subacute diarrheal illness   History: Type 1 diabetes  Home DM Meds: Omnipod insulin pump with Humalog   Current Orders: Insulin Pump     Endocrinologist: Monica Doer A1c 7.7% on 1/15   NPO this AM for Video pill camera study   May be having HYPO events due to NPO status this AM   Met w/ pt at bedside late this AM.  Pt Dexcom sensor reading was 209 when entered the room.  Pt told me the RN told her to suspend the basal rates on her pump last night (~7pm).  Uses Omni Pod Dash pump.   Reviewed pump settings at bedside with pt and her PDM: Basal rates: 12am- 0.4 unit 3am- 0.45 unit 7am- 0.55 unit 2pm- 0.3 unit 6pm- 0.5 unit Total basal per 24 hours= 11.05 units Carb Ratio is set for 3.3-10 (1 unit for every 3.3-10 grams Carbs depending on the time) Correction Factor is set for 35-40 (1 unit for every 35-40 mg/dl above target CBG depending on the time) Goal CBG 120-140 mg/dl Pt to change her Pod tonight--Has all supplies at bedside and dtr to bring insulin to her this evening.  Called Dr. Elvera Lennox and  reviewed with him that basal rates on the pump have been off since 7pm last night--Current Dexcom reading 209.  Dr. Elvera Lennox told me to tell pt to resume her basal rates on her pump.  Went back to room (RN also at bedside) and relayed to pt to resume her basal rates on her pump which she did.      --Will follow patient during hospitalization--  Ambrose Finland RN, MSN, CDCES Diabetes Coordinator Inpatient Glycemic Control Team Team Pager: [phone] (8a-5p)

## 2023-02-20 NOTE — Plan of Care (Addendum)
  Problem: Activity: Goal: Risk for activity intolerance will decrease Outcome: Progressing   Problem: Nutrition: Goal: Adequate nutrition will be maintained Outcome: Progressing   Problem: Safety: Goal: Ability to remain free from injury will improve Outcome: Progressing   

## 2023-02-20 NOTE — Progress Notes (Signed)
Givens capsule endoscopy ordered by MD Cirigliano .  Patient ingested capsule at 0755.  Per Given's capsule instructions, patient to remain NPO until 0955 at which time they may progress to clear liquid diet. At 1155 patient may have a small snack such as a half a sandwich or a bowl of soup. At 1555 patient may progress to previously ordered diet.  The capsule endoscopy study will conclude at 2155 at which time the recorder and leads or belt can be removed and placed in a patient belongings bag. Endoscopy staff will pick up the equipment in the AM.  Instructions provided to patient and inpatient RN. Patient and RN demonstrated understanding.

## 2023-02-20 NOTE — Progress Notes (Signed)
Mobility Specialist - Progress Note   02/20/23 1425  Mobility  Activity Ambulated independently to bathroom;Ambulated independently in hallway  Level of Assistance Independent after set-up  Assistive Device None  Distance Ambulated (ft) 700 ft  Range of Motion/Exercises Active  Activity Response Tolerated well  Mobility Referral Yes  Mobility visit 1 Mobility  Mobility Specialist Start Time (ACUTE ONLY) 1410  Mobility Specialist Stop Time (ACUTE ONLY) 1425  Mobility Specialist Time Calculation (min) (ACUTE ONLY) 15 min   Pt was found in room and agreeable to ambulate. No complaints with session. At EOS returned to bed with all needs met. Call bell in reach.  Billey Chang Mobility Specialist

## 2023-02-21 DIAGNOSIS — R197 Diarrhea, unspecified: Secondary | ICD-10-CM | POA: Diagnosis not present

## 2023-02-21 DIAGNOSIS — K3189 Other diseases of stomach and duodenum: Secondary | ICD-10-CM

## 2023-02-21 DIAGNOSIS — K2970 Gastritis, unspecified, without bleeding: Secondary | ICD-10-CM | POA: Diagnosis not present

## 2023-02-21 DIAGNOSIS — R195 Other fecal abnormalities: Secondary | ICD-10-CM | POA: Diagnosis not present

## 2023-02-21 DIAGNOSIS — E8721 Acute metabolic acidosis: Secondary | ICD-10-CM | POA: Diagnosis not present

## 2023-02-21 LAB — GLUCOSE, CAPILLARY
Glucose-Capillary: 71 mg/dL (ref 70–99)
Glucose-Capillary: 134 mg/dL — ABNORMAL HIGH (ref 70–99)
Glucose-Capillary: 53 mg/dL — ABNORMAL LOW (ref 70–99)
Glucose-Capillary: 97 mg/dL (ref 70–99)
Glucose-Capillary: 66 mg/dL — ABNORMAL LOW (ref 70–99)
Glucose-Capillary: 81 mg/dL (ref 70–99)
Glucose-Capillary: 146 mg/dL — ABNORMAL HIGH (ref 70–99)

## 2023-02-21 LAB — BASIC METABOLIC PANEL
Anion gap: 4 — ABNORMAL LOW (ref 5–15)
BUN: 14 mg/dL (ref 8–23)
CO2: 20 mmol/L — ABNORMAL LOW (ref 22–32)
Calcium: 8.6 mg/dL — ABNORMAL LOW (ref 8.9–10.3)
Chloride: 114 mmol/L — ABNORMAL HIGH (ref 98–111)
Creatinine, Ser: 0.6 mg/dL (ref 0.44–1.00)
GFR, Estimated: >60 mL/min (ref >60)
Glucose, Bld: 77 mg/dL (ref 70–99)
Potassium: 4 mmol/L (ref 3.5–5.1)
Sodium: 138 mmol/L (ref 135–145)

## 2023-02-21 LAB — MAGNESIUM: Magnesium: 1.9 mg/dL (ref 1.7–2.4)

## 2023-02-21 NOTE — Plan of Care (Signed)
  Problem: Education: Goal: Knowledge of General Education information will improve Description: Including pain rating scale, medication(s)/side effects and non-pharmacologic comfort measures Outcome: Progressing   Problem: Health Behavior/Discharge Planning: Goal: Ability to manage health-related needs will improve Outcome: Progressing   Problem: Coping: Goal: Ability to adjust to condition or change in health will improve Outcome: Progressing

## 2023-02-21 NOTE — Progress Notes (Addendum)
Attending physician's note   I have taken a history, reviewed the chart, and examined the patient. I performed a substantive portion of this encounter, including complete performance of at least one of the key components, in conjunction with the APP. I agree with the APP's note, impression, and recommendations with my edits.   Alveda Vanhorne, DO, FACG [phone] office         [facility] Progress Note  CC:   Diarrhea   Subjective: She denies having any abdominal pain.  She passed small amount of liquid stool when she went to the bathroom to urinate a few times overnight and passed a thicker mud like stool x 1 this morning.  No bloody or black stools.  She anxiously awaits mobile capsule endoscopy results.  No chest pain or shortness of breath.   Objective:  Vital signs in last 24 hours: Temp:  [97.8 F (36.6 C)-99 F (37.2 C)] 97.8 F (36.6 C) (01/21 0446) Pulse Rate:  [66-70] 66 (01/21 0446) Resp:  [16-18] 16 (01/21 0446) BP: (151-157)/(69-74) 152/69 (01/21 0446) SpO2:  [97 %-100 %] 98 % (01/21 0446) Last BM Date : 02/20/23 General: 83 year old female in no acute distress. Heart: Regular rate and rhythm, no murmurs. Pulm: Breath sounds clear throughout. Abdomen: Soft, nondistended.  Bowel sounds to all 4 quadrants. Extremities: Bilateral lower extremity edema, LLE > RLE, negative Homans' sign. Neurologic:  Alert and  oriented x 4. Grossly normal neurologically. Psych:  Alert and cooperative. Normal mood and affect.  Intake/Output from previous day: 01/20 0701 - 01/21 0700 In: 3276.9 [P.O.:600; I.V.:2676.9] Out: -  Intake/Output this shift: No intake/output data recorded.  Lab Results: Recent Labs    02/19/23 0417 02/20/23 0303  WBC 5.3 5.8  HGB 9.0* 9.0*  HCT 28.6* 28.6*  PLT 226 232   BMET Recent Labs    02/19/23 0417 02/20/23 0303 02/21/23 0418  NA 137 141 138  K 3.6 3.7 4.0  CL 116* 116* 114*  CO2 18* 19* 20*  GLUCOSE 103*  40* 77  BUN 20 16 14   CREATININE 0.64 0.71 0.60  CALCIUM 8.1* 8.4* 8.6*   LFT No results for input(s): "PROT", "ALBUMIN", "AST", "ALT", "ALKPHOS", "BILITOT", "BILIDIR", "IBILI" in the last 72 hours. PT/INR No results for input(s): "LABPROT", "INR" in the last 72 hours. Hepatitis Panel No results for input(s): "HEPBSAG", "HCVAB", "HEPAIGM", "HEPBIGM" in the last 72 hours.  No results found.  Assessment / Plan:  83 year old with type 1 diabetes, paroxysmal A-fib relative, admitted with recurrent subacute nearly chronic diarrhea at this point. Elevated fecal calprotectin. Low fecal elastase.  Negative GI pathogen panel and C. difficile.  Normal TSH.  Negative celiac panel.  CTAP 02/13/2023 showed fluid with scattered air-fluid levels throughout the colon without bowel wall inflammation, thickening or bowel obstruction.  Small bowel capsule endoscopy to rule out small bowel Crohn's disease in process, results pending.  Hemodynamically stable. -Continue Rifaximin 3 times daily and Creon 3 times daily with meals -Continue Cholestyramine 4 g pack p.o. 3 times daily -Continue Lomotil 1 tab p.o. 4 times daily -Diet as tolerated -Further GI recommendations to be determined after small bowel capsule endoscopy results reviewed   Normocytic anemia. Stable Hg 9.0. No overt GI bleeding.  -CBC, Iron, TIBC and ferritin in am  Bilateral lower extremity edema, left calf notably larger than the right with negative Homans' sign. -Recommend lower extremity Doppler, defer to Dr. Elvera Lennox  Principal Problem:   Diarrhea Active Problems:  Acute metabolic acidosis   Dehydration   History of cholecystectomy   AKI (acute kidney injury) (HCC)   Chronic diarrhea   Nonspecific abnormal finding in stool contents     LOS: 5 days   Arnaldo Natal  02/21/2023, 10:41 AM

## 2023-02-21 NOTE — H&P (View-Only) (Signed)
Attending physician's note   I have taken a history, reviewed the chart, and examined the patient. I performed a substantive portion of this encounter, including complete performance of at least one of the key components, in conjunction with the APP. I agree with the APP's note, impression, and recommendations with my edits.   Alveda Vanhorne, DO, FACG [phone] office         [facility] Progress Note  CC:   Diarrhea   Subjective: She denies having any abdominal pain.  She passed small amount of liquid stool when she went to the bathroom to urinate a few times overnight and passed a thicker mud like stool x 1 this morning.  No bloody or black stools.  She anxiously awaits mobile capsule endoscopy results.  No chest pain or shortness of breath.   Objective:  Vital signs in last 24 hours: Temp:  [97.8 F (36.6 C)-99 F (37.2 C)] 97.8 F (36.6 C) (01/21 0446) Pulse Rate:  [66-70] 66 (01/21 0446) Resp:  [16-18] 16 (01/21 0446) BP: (151-157)/(69-74) 152/69 (01/21 0446) SpO2:  [97 %-100 %] 98 % (01/21 0446) Last BM Date : 02/20/23 General: 82 year old female in no acute distress. Heart: Regular rate and rhythm, no murmurs. Pulm: Breath sounds clear throughout. Abdomen: Soft, nondistended.  Bowel sounds to all 4 quadrants. Extremities: Bilateral lower extremity edema, LLE > RLE, negative Homans' sign. Neurologic:  Alert and  oriented x 4. Grossly normal neurologically. Psych:  Alert and cooperative. Normal mood and affect.  Intake/Output from previous day: 01/20 0701 - 01/21 0700 In: 3276.9 [P.O.:600; I.V.:2676.9] Out: -  Intake/Output this shift: No intake/output data recorded.  Lab Results: Recent Labs    02/19/23 0417 02/20/23 0303  WBC 5.3 5.8  HGB 9.0* 9.0*  HCT 28.6* 28.6*  PLT 226 232   BMET Recent Labs    02/19/23 0417 02/20/23 0303 02/21/23 0418  NA 137 141 138  K 3.6 3.7 4.0  CL 116* 116* 114*  CO2 18* 19* 20*  GLUCOSE 103*  40* 77  BUN 20 16 14   CREATININE 0.64 0.71 0.60  CALCIUM 8.1* 8.4* 8.6*   LFT No results for input(s): "PROT", "ALBUMIN", "AST", "ALT", "ALKPHOS", "BILITOT", "BILIDIR", "IBILI" in the last 72 hours. PT/INR No results for input(s): "LABPROT", "INR" in the last 72 hours. Hepatitis Panel No results for input(s): "HEPBSAG", "HCVAB", "HEPAIGM", "HEPBIGM" in the last 72 hours.  No results found.  Assessment / Plan:  83 year old with type 1 diabetes, paroxysmal A-fib relative, admitted with recurrent subacute nearly chronic diarrhea at this point. Elevated fecal calprotectin. Low fecal elastase.  Negative GI pathogen panel and C. difficile.  Normal TSH.  Negative celiac panel.  CTAP 02/13/2023 showed fluid with scattered air-fluid levels throughout the colon without bowel wall inflammation, thickening or bowel obstruction.  Small bowel capsule endoscopy to rule out small bowel Crohn's disease in process, results pending.  Hemodynamically stable. -Continue Rifaximin 3 times daily and Creon 3 times daily with meals -Continue Cholestyramine 4 g pack p.o. 3 times daily -Continue Lomotil 1 tab p.o. 4 times daily -Diet as tolerated -Further GI recommendations to be determined after small bowel capsule endoscopy results reviewed   Normocytic anemia. Stable Hg 9.0. No overt GI bleeding.  -CBC, Iron, TIBC and ferritin in am  Bilateral lower extremity edema, left calf notably larger than the right with negative Homans' sign. -Recommend lower extremity Doppler, defer to Dr. Elvera Lennox  Principal Problem:   Diarrhea Active Problems:  Acute metabolic acidosis   Dehydration   History of cholecystectomy   AKI (acute kidney injury) (HCC)   Chronic diarrhea   Nonspecific abnormal finding in stool contents     LOS: 5 days   Donna Wiley  02/21/2023, 10:41 AM

## 2023-02-21 NOTE — Procedures (Addendum)
Video Capsule Endoscopy report:  Findings: 1) Complete capsule study with adequate prep 2) Delayed passage of the capsule through the esophagus with first gastric image at 21 minutes and 43 seconds.  Possible lower esophageal stricture. 3) Mild gastritis with mucosal edema on limited gastric views. 4) Mildly edematous mucosa in the very proximal small bowel which normalized in the duodenum, approximately 2 minutes after the pylorus.  The remainder of the proximal and mid small bowel were all normal. 5) Single subtle erosion in the distal small bowel at 4 hours 35 minutes.  Focal area of  very mildly edematous mucosa in this area. There was no associated  villous atrophy, stricture, or clear ulceration .  This rapidly normalized and the remainder of the small bowel was normal-appearing 6) First duodenal image at 49 minutes.  First cecal image at 4 hours 46 minutes  Summary: Aside from mildly edematous mucosa in the very proximal small bowel, and a short area of possible subtle inflammation in the distal small bowel (single faint erosion and possible subtle wall edema without villous atrophy), the small bowel is otherwise normal on this study.  The findings in the distal small bowel do not seem consistent with Celiac Disease or small bowel Crohns Disease.  Otherwise, the remainder of the small bowel was normal appearing throughout.   Separately, delayed passage of the capsule through the esophagus with first gastric image at 21 minutes and 43 seconds.  Possible lower esophageal stricture.  Recommendations: - Upper endoscopy to evaluate esophageal stricture, gastritis, and proximal duodenitis - Resume rifaximin, Creon, cholestyramine, Lomotil - Depending on clinical response, could consider course of Entocort - Results discussed with patient today, and she would like to proceed with EGD tomorrow - Xarelto hold for EGD tomorrow - NPO at MN   Copy of this report, including images, will be scanned  into Procedure tab in Epic.    Doristine Locks, DO, [facility]

## 2023-02-21 NOTE — Progress Notes (Signed)
PROGRESS NOTE  Donna Wiley OZD:[id number] DOB: Jun 12, 1940 DOA: 02/13/2023 PCP: Raquel James, MD   LOS: 5 days   Brief Narrative / Interim history: 83 year old female with history of DM1 on insulin pump, PAF on Xarelto, aortic stenosis comes into the hospital with several weeks of watery diarrhea.  She was hospitalized December 2020 for 4 profuse diarrhea, underwent GI workup with colonoscopy, infectious workup, all negative.  She was placed on loperamide as well as cholestyramine, improved and discharged home.  As she finished taking the medications, diarrhea returned and decided come back to the hospital.  Subjective / 24h Interval events: Overall better, less diarrhea.  Capsule endoscopy to be done today  Assesement and Plan: Principal problem Subacute diarrheal illness -patient hospitalized in December with similar symptoms, underwent a colonoscopy on 12/13 which showed normal terminal ileum, normal colon, random biopsies taken and was negative for microscopic colitis -Recent workup was negative for stool studies -Symptoms improved with cholestyramine as well as Lomotil following most recent discharge, but as soon as she finished these medications, diarrhea returned -Etiology not clear, GI consulted.  Started on rifaximin, continue.  She had a low fecal elastase suggesting pancreatic insufficiency but could be fictitiously low due to dilution per gastroenterology.  Regardless, she was started on Creon.  Continue to monitor, underwent a capsule Study yesterday, read pending today  Active problems Diabetes mellitus, type I-continue insulin pump  CBG (last 3)  Recent Labs    02/21/23 0442 02/21/23 0736 02/21/23 1123  GLUCAP 71 134* 53*    PAF-continue flecainide, Xarelto  Acute kidney injury non, anion gap metabolic acidosis-creatinine 2.8 on admission, creatinine normal with fluids and has remained normal  Hypokalemia, hypomagnesemia-monitor and replace as  indicated  Hypophosphatemia-monitor and replace as indicated  Mild normocytic anemia-stable, no bleeding  Scheduled Meds:  cholestyramine light  4 g Oral TID   diphenoxylate-atropine  1 tablet Oral QID   feeding supplement  237 mL Oral TID BM   flecainide  50 mg Oral Daily   And   flecainide  100 mg Oral QHS   insulin pump   Subcutaneous TID WC, HS, 0200   lipase/protease/amylase  72,000 Units Oral TID WC   LORazepam  1 mg Oral QHS   rifaximin  550 mg Oral TID   rivaroxaban  15 mg Oral Q supper   sodium bicarbonate  650 mg Oral TID   Continuous Infusions:  sodium chloride Stopped (02/21/23 0517)   PRN Meds:.acetaminophen **OR** acetaminophen, dextrose, ondansetron **OR** ondansetron (ZOFRAN) IV, mouth rinse  Current Outpatient Medications  Medication Instructions   acetaminophen (TYLENOL) 1,000 mg, Oral, Every 6 hours PRN   b complex vitamins capsule 1 capsule, Every evening   calcium citrate (CALCITRATE - DOSED IN MG ELEMENTAL CALCIUM) 950 (200 Ca) MG tablet 500 mg of elemental calcium, 2 times daily   cholestyramine (QUESTRAN) 4 g, Oral, 2 times daily   clobetasol cream (TEMOVATE) 0.05 % 1 Application, 2 times daily PRN   diltiazem (CARDIZEM CD) 180 mg, Nightly   EPINEPHrine (EPI-PEN) 0.3 mg, As needed   flecainide (TAMBOCOR) 50-100 mg, See admin instructions   insulin lispro (HUMALOG) 0-15 Units, 4 times daily   LORazepam (ATIVAN) 0.5 mg, Nightly   mirabegron ER (MYRBETRIQ) 50 mg, Daily   [Paused] olmesartan-hydrochlorothiazide (BENICAR HCT) 40-12.5 MG tablet 1 tablet, Every morning   ondansetron (ZOFRAN) 4 mg, Oral, Every 6 hours   pantoprazole (PROTONIX) 40 mg, Daily   rivaroxaban (XARELTO) 20 mg, Every evening   simvastatin (  ZOCOR) 10 mg, Daily at bedtime    Diet Orders (From admission, onward)     Start     Ordered   02/20/23 1621  Diet Carb Modified Fluid consistency: Thin; Room service appropriate? Yes  Diet effective now       Question Answer Comment   Diet-HS Snack? Snack-yes   Calorie Level Medium 1600-2000   Fluid consistency: Thin   Room service appropriate? Yes      02/20/23 1621            DVT prophylaxis: Place and maintain sequential compression device Start: 02/14/23 1119 Rivaroxaban (XARELTO) tablet 15 mg   Lab Results  Component Value Date   PLT 232 02/20/2023      Code Status: Full Code  Family Communication: No family at bedside  Status is: Inpatient  Level of care: Telemetry  Consultants:  GI  Objective: Vitals:   02/20/23 0438 02/20/23 1216 02/20/23 2051 02/21/23 0446  BP: (!) 139/57 (!) 157/74 (!) 151/72 (!) 152/69  Pulse: 75 67 70 66  Resp: 14 16 18 16   Temp: (!) 97.4 F (36.3 C) 98.4 F (36.9 C) 99 F (37.2 C) 97.8 F (36.6 C)  TempSrc: Oral Oral Oral Oral  SpO2: 96% 100% 97% 98%  Weight:      Height:        Intake/Output Summary (Last 24 hours) at 02/21/2023 1142 Last data filed at 02/20/2023 1930 Gross per 24 hour  Intake 3276.9 ml  Output --  Net 3276.9 ml   Wt Readings from Last 3 Encounters:  02/13/23 54.8 kg  02/04/23 61.2 kg  01/17/23 59.3 kg    Examination:  Constitutional: NAD Eyes: lids and conjunctivae normal, no scleral icterus ENMT: mmm Neck: normal, supple Respiratory: clear to auscultation bilaterally, no wheezing, no crackles. Normal respiratory effort.  Cardiovascular: Regular rate and rhythm, no murmurs / rubs / gallops. No LE edema. Abdomen: soft, no distention, no tenderness. Bowel sounds positive.    Data Reviewed: I have independently reviewed following labs and imaging studies   CBC Recent Labs  Lab 02/15/23 0421 02/16/23 0407 02/17/23 0432 02/18/23 0433 02/19/23 0417 02/20/23 0303  WBC 4.8 5.2 5.0 5.0 5.3 5.8  HGB 9.5* 9.9* 8.7* 9.3* 9.0* 9.0*  HCT 29.4* 31.1* 28.0* 29.2* 28.6* 28.6*  PLT 268 264 217 226 226 232  MCV 88.8 90.7 91.8 92.7 90.2 90.8  MCH 28.7 28.9 28.5 29.5 28.4 28.6  MCHC 32.3 31.8 31.1 31.8 31.5 31.5  RDW 13.9 13.9  14.2 14.3 14.2 14.2  LYMPHSABS 1.3  --   --   --   --   --   MONOABS 0.5  --   --   --   --   --   EOSABS 0.1  --   --   --   --   --   BASOSABS 0.0  --   --   --   --   --     Recent Labs  Lab 02/15/23 0421 02/16/23 0407 02/17/23 0432 02/18/23 0433 02/19/23 0417 02/20/23 0303 02/21/23 0418  NA 137 136 138 138 137 141 138  K 3.6 3.1* 3.6 3.8 3.6 3.7 4.0  CL 113* 114* 118* 118* 116* 116* 114*  CO2 15* 17* 14* 16* 18* 19* 20*  GLUCOSE 279* 137* 219* 86 103* 40* 77  BUN 27* 21 14 15 20 16 14   CREATININE 1.01* 0.69 0.61 0.47 0.64 0.71 0.60  CALCIUM 8.3* 8.0* 7.9* 8.0* 8.1* 8.4* 8.6*  AST 21 21 19 19   --   --   --   ALT 19 18 16 16   --   --   --   ALKPHOS 46 48 38 38  --   --   --   BILITOT 0.7 0.5 0.3 0.4  --   --   --   ALBUMIN 3.1* 2.9* 2.4* 2.5*  --   --   --   MG 1.9 1.8 1.3* 1.9 1.7 1.9 1.9  TSH 1.264  --   --   --   --   --   --   HGBA1C 7.7*  --   --   --   --   --   --     ------------------------------------------------------------------------------------------------------------------ No results for input(s): "CHOL", "HDL", "LDLCALC", "TRIG", "CHOLHDL", "LDLDIRECT" in the last 72 hours.  Lab Results  Component Value Date   HGBA1C 7.7 (H) 02/15/2023   ------------------------------------------------------------------------------------------------------------------ No results for input(s): "TSH", "T4TOTAL", "T3FREE", "THYROIDAB" in the last 72 hours.  Invalid input(s): "FREET3"   Cardiac Enzymes No results for input(s): "CKMB", "TROPONINI", "MYOGLOBIN" in the last 168 hours.  Invalid input(s): "CK" ------------------------------------------------------------------------------------------------------------------ No results found for: "BNP"  CBG: Recent Labs  Lab 02/20/23 1819 02/20/23 2053 02/21/23 0442 02/21/23 0736 02/21/23 1123  GLUCAP 101* 156* 71 134* 53*    Recent Results (from the past 240 hours)  Gastrointestinal Panel by PCR , Stool      Status: None   Collection Time: 02/13/23  4:14 PM   Specimen: Stool  Result Value Ref Range Status   Campylobacter species NOT DETECTED NOT DETECTED Final   Plesimonas shigelloides NOT DETECTED NOT DETECTED Final   Salmonella species NOT DETECTED NOT DETECTED Final   Yersinia enterocolitica NOT DETECTED NOT DETECTED Final   Vibrio species NOT DETECTED NOT DETECTED Final   Vibrio cholerae NOT DETECTED NOT DETECTED Final   Enteroaggregative E coli (EAEC) NOT DETECTED NOT DETECTED Final   Enteropathogenic E coli (EPEC) NOT DETECTED NOT DETECTED Final   Enterotoxigenic E coli (ETEC) NOT DETECTED NOT DETECTED Final   Shiga like toxin producing E coli (STEC) NOT DETECTED NOT DETECTED Final   Shigella/Enteroinvasive E coli (EIEC) NOT DETECTED NOT DETECTED Final   Cryptosporidium NOT DETECTED NOT DETECTED Final   Cyclospora cayetanensis NOT DETECTED NOT DETECTED Final   Entamoeba histolytica NOT DETECTED NOT DETECTED Final   Giardia lamblia NOT DETECTED NOT DETECTED Final   Adenovirus F40/41 NOT DETECTED NOT DETECTED Final   Astrovirus NOT DETECTED NOT DETECTED Final   Norovirus GI/GII NOT DETECTED NOT DETECTED Final   Rotavirus A NOT DETECTED NOT DETECTED Final   Sapovirus (I, II, IV, and V) NOT DETECTED NOT DETECTED Final    Comment: Performed at [facility], [address] [place], [place] [address]  C Difficile Quick Screen w PCR reflex     Status: None   Collection Time: 02/13/23  4:15 PM   Specimen: Stool  Result Value Ref Range Status   C Diff antigen NEGATIVE NEGATIVE Final   C Diff toxin NEGATIVE NEGATIVE Final   C Diff interpretation No C. difficile detected.  Final    Comment: Performed at [facility], [address] [place], [place] [address]  Calprotectin, Fecal     Status: Abnormal   Collection Time: 02/14/23  6:02 PM   Specimen: Stool  Result Value Ref Range Status   Calprotectin, Fecal 274 (H) 0 - 120 ug/g Final    Comment: (NOTE) Concentration  Interpretation   Follow-Up < 5 - 50 ug/g     Normal           None >50 -120 ug/g     Borderline       Re-evaluate in 4-6 weeks    >120 ug/g     Abnormal         Repeat as clinically                                   indicated Performed At: [facility] [address] [place], [place] [id number] Jolene Schimke MD LK:[id number]      Radiology Studies: No results found.   Pamella Pert, MD, PhD [facility]  Between 7 am - 7 pm I am available, please contact me via Amion (for emergencies) or Securechat (non urgent messages)  Between 7 pm - 7 am I am not available, please contact night coverage MD/APP via Amion

## 2023-02-22 ENCOUNTER — Inpatient Hospital Stay (HOSPITAL_COMMUNITY): Payer: Medicare PPO | Admitting: Certified Registered Nurse Anesthetist

## 2023-02-22 ENCOUNTER — Encounter (HOSPITAL_COMMUNITY): Payer: Self-pay | Admitting: Gastroenterology

## 2023-02-22 ENCOUNTER — Encounter (HOSPITAL_COMMUNITY)
Admission: EM | Disposition: A | Discharge status: Home / Self Care | Payer: Self-pay | Source: Home / Self Care | Attending: Internal Medicine

## 2023-02-22 DIAGNOSIS — I4819 Other persistent atrial fibrillation: Secondary | ICD-10-CM | POA: Insufficient documentation

## 2023-02-22 DIAGNOSIS — I4891 Unspecified atrial fibrillation: Secondary | ICD-10-CM

## 2023-02-22 DIAGNOSIS — J45909 Unspecified asthma, uncomplicated: Secondary | ICD-10-CM

## 2023-02-22 DIAGNOSIS — E109 Type 1 diabetes mellitus without complications: Secondary | ICD-10-CM | POA: Diagnosis not present

## 2023-02-22 DIAGNOSIS — D649 Anemia, unspecified: Secondary | ICD-10-CM | POA: Insufficient documentation

## 2023-02-22 DIAGNOSIS — K529 Noninfective gastroenteritis and colitis, unspecified: Secondary | ICD-10-CM | POA: Diagnosis not present

## 2023-02-22 DIAGNOSIS — K219 Gastro-esophageal reflux disease without esophagitis: Secondary | ICD-10-CM | POA: Diagnosis not present

## 2023-02-22 DIAGNOSIS — K2970 Gastritis, unspecified, without bleeding: Secondary | ICD-10-CM

## 2023-02-22 HISTORY — PX: BIOPSY: SHX5522

## 2023-02-22 HISTORY — PX: ESOPHAGOGASTRODUODENOSCOPY: SHX5428

## 2023-02-22 LAB — GLUCOSE, CAPILLARY
Glucose-Capillary: 123 mg/dL — ABNORMAL HIGH (ref 70–99)
Glucose-Capillary: 189 mg/dL — ABNORMAL HIGH (ref 70–99)
Glucose-Capillary: 145 mg/dL — ABNORMAL HIGH (ref 70–99)
Glucose-Capillary: 78 mg/dL (ref 70–99)

## 2023-02-22 LAB — IRON AND TIBC
Iron: 20 ug/dL — ABNORMAL LOW (ref 28–170)
Saturation Ratios: 8 % — ABNORMAL LOW (ref 10.4–31.8)
TIBC: 255 ug/dL (ref 250–450)
UIBC: 235 ug/dL

## 2023-02-22 LAB — CBC
HCT: 28.2 % — ABNORMAL LOW (ref 36.0–46.0)
Hemoglobin: 8.9 g/dL — ABNORMAL LOW (ref 12.0–15.0)
MCH: 28.7 pg (ref 26.0–34.0)
MCHC: 31.6 g/dL (ref 30.0–36.0)
MCV: 91 fL (ref 80.0–100.0)
Platelets: 198 10*3/uL (ref 150–400)
RBC: 3.1 MIL/uL — ABNORMAL LOW (ref 3.87–5.11)
RDW: 14.3 % (ref 11.5–15.5)
WBC: 4.3 10*3/uL (ref 4.0–10.5)
nRBC: 0 % (ref 0.0–0.2)

## 2023-02-22 LAB — BASIC METABOLIC PANEL
Anion gap: 5 (ref 5–15)
BUN: 19 mg/dL (ref 8–23)
CO2: 23 mmol/L (ref 22–32)
Calcium: 8.7 mg/dL — ABNORMAL LOW (ref 8.9–10.3)
Chloride: 111 mmol/L (ref 98–111)
Creatinine, Ser: 0.67 mg/dL (ref 0.44–1.00)
GFR, Estimated: >60 mL/min (ref >60)
Glucose, Bld: 128 mg/dL — ABNORMAL HIGH (ref 70–99)
Potassium: 3.7 mmol/L (ref 3.5–5.1)
Sodium: 139 mmol/L (ref 135–145)

## 2023-02-22 LAB — MAGNESIUM: Magnesium: 1.6 mg/dL — ABNORMAL LOW (ref 1.7–2.4)

## 2023-02-22 LAB — PHOSPHORUS: Phosphorus: 3.6 mg/dL (ref 2.5–4.6)

## 2023-02-22 LAB — FERRITIN: Ferritin: 19 ng/mL (ref 11–307)

## 2023-02-22 LAB — SODIUM, STOOL: Sodium, Stl: 56 mmol/L

## 2023-02-22 SURGERY — EGD (ESOPHAGOGASTRODUODENOSCOPY)
Anesthesia: Monitor Anesthesia Care | Laterality: Left

## 2023-02-22 MED ORDER — PROPOFOL 10 MG/ML IV BOLUS
INTRAVENOUS | Status: DC | PRN
  Administered 2023-02-22: 30 mg via INTRAVENOUS

## 2023-02-22 MED ORDER — PROPOFOL 500 MG/50ML IV EMUL
INTRAVENOUS | Status: AC
  Filled 2023-02-22: qty 100

## 2023-02-22 MED ORDER — FERROUS SULFATE 325 (65 FE) MG PO TABS
325.0000 mg | ORAL_TABLET | ORAL | Status: DC
  Administered 2023-02-22: 325 mg via ORAL
  Filled 2023-02-22: qty 1

## 2023-02-22 MED ORDER — PROPOFOL 500 MG/50ML IV EMUL
INTRAVENOUS | Status: DC | PRN
  Administered 2023-02-22: 125 ug/kg/min via INTRAVENOUS

## 2023-02-22 NOTE — Anesthesia Postprocedure Evaluation (Signed)
Anesthesia Post Note  Patient: Donna Wiley  Procedure(s) Performed: ESOPHAGOGASTRODUODENOSCOPY (EGD) (Left) BIOPSY     Patient location during evaluation: PACU Anesthesia Type: MAC Level of consciousness: awake and alert and oriented Pain management: pain level controlled Vital Signs Assessment: post-procedure vital signs reviewed and stable Respiratory status: spontaneous breathing, nonlabored ventilation and respiratory function stable Cardiovascular status: stable and blood pressure returned to baseline Postop Assessment: no apparent nausea or vomiting Anesthetic complications: no   No notable events documented.  Last Vitals:  Vitals:   02/22/23 1320 02/22/23 1400  BP: (!) 165/68 (!) 151/61  Pulse: 67 67  Resp: 15 18  Temp:  36.9 C  SpO2: 96% 98%    Last Pain:  Vitals:   02/22/23 1400  TempSrc: Oral  PainSc:                  Donna Wiley A.

## 2023-02-22 NOTE — Anesthesia Procedure Notes (Signed)
Procedure Name: MAC Date/Time: 02/22/2023 12:48 PM  Performed by: Vanessa Montague, CRNAOxygen Delivery Method: Nasal cannula

## 2023-02-22 NOTE — Assessment & Plan Note (Signed)
NAGMA

## 2023-02-22 NOTE — Progress Notes (Signed)
  Progress Note   Patient: Donna Wiley ZOX:[id number] DOB: 12-15-1940 DOA: 02/13/2023     6 DOS: the patient was seen and examined on 02/22/2023 at 10:18AM      Brief hospital course: 83 y.o. F with T1DM on insulin pump, pAF on Xarelto and iron deficiency anemia who presented with persistent diarrhea.     Assessment and Plan: * Subacute diarrhea EGD today unremarkable. Stool pace and consistency better. - Continue Creon - Continue cholestyramine - Continue rifaximin - Continue Lomotil   AKI (acute kidney injury) (HCC) NAGMA Resolved  Diabetes Glucoses normal - Continue insulin pump  Anemia Stable, asymptomatic, no bleeding reported or observed  Chronic atrial fibrillation - Continue flecainide - Continue Xarelto          Subjective: 3 BMs yesterday, 3 today.  No fever, no cramps.  To EGD today.     Physical Exam: BP (!) 151/61 (BP Location: Left Arm)   Pulse 67   Temp 98.4 F (36.9 C) (Oral)   Resp 18   Ht 5\' 2"  (1.575 m)   Wt 54.8 kg   SpO2 98%   BMI 22.10 kg/m   Thin adult female, lying in bed, no acute distress RRR no murmurs, no LE edema, Lungs clear, no rales or wheezes Abdomen soft, without TTP Attention normal, affectnormal face symmetric, speech fluent, MAE    Data Reviewed: Iron saturation low Na, K and Cr normal Hgb 8.9 and stahble  Family Communication: None    Disposition: Status is: Inpatient         Author: Alberteen Sam, MD 02/22/2023 3:25 PM  For on call review [email or web address].

## 2023-02-22 NOTE — Anesthesia Preprocedure Evaluation (Addendum)
Anesthesia Evaluation  Patient identified by MRN, date of birth, ID band Patient awake    Reviewed: Allergy & Precautions, H&P , NPO status , Patient's Chart, lab work & pertinent test results  Airway Mallampati: II  TM Distance: >3 FB Neck ROM: Full    Dental no notable dental hx. (+) Teeth Intact, Dental Advisory Given   Pulmonary asthma    Pulmonary exam normal breath sounds clear to auscultation       Cardiovascular + dysrhythmias Atrial Fibrillation and Supra Ventricular Tachycardia  Rhythm:Regular Rate:Normal     Neuro/Psych negative neurological ROS  negative psych ROS   GI/Hepatic Neg liver ROS,GERD  Medicated,,  Endo/Other  diabetes, Type 1, Insulin Dependent    Renal/GU Renal InsufficiencyRenal disease  negative genitourinary   Musculoskeletal  (+) Arthritis , Osteoarthritis,    Abdominal   Peds  Hematology  (+) Blood dyscrasia, anemia   Anesthesia Other Findings   Reproductive/Obstetrics negative OB ROS                             Anesthesia Physical Anesthesia Plan  ASA: 3  Anesthesia Plan: MAC   Post-op Pain Management: Minimal or no pain anticipated   Induction: Intravenous  PONV Risk Score and Plan: 2 and Propofol infusion and Treatment may vary due to age or medical condition  Airway Management Planned: Natural Airway and Simple Face Mask  Additional Equipment:   Intra-op Plan:   Post-operative Plan:   Informed Consent: I have reviewed the patients History and Physical, chart, labs and discussed the procedure including the risks, benefits and alternatives for the proposed anesthesia with the patient or authorized representative who has indicated his/her understanding and acceptance.     Dental advisory given  Plan Discussed with: CRNA  Anesthesia Plan Comments:        Anesthesia Quick Evaluation

## 2023-02-22 NOTE — Hospital Course (Signed)
83 y.o. F with T1DM on insulin pump, pAF on Xarelto and iron deficiency anemia who presented with persistent diarrhea.

## 2023-02-22 NOTE — Interval H&P Note (Signed)
History and Physical Interval Note:  Video capsule endoscopy completed and images reviewed yesterday and notable for highly suspected esophageal stricture with delayed passage of VCE through the esophagus, along with gastritis and proximal duodenitis.  Presents today for endoscopy for diagnostic and potentially therapeutic intent with esophageal dilation and/or biopsies as appropriate.  02/22/2023 12:36 PM  Donna Wiley  has presented today for surgery, with the diagnosis of Diarrhea, dysphagia.  The various methods of treatment have been discussed with the patient and family. After consideration of risks, benefits and other options for treatment, the patient has consented to  Procedure(s): ESOPHAGOGASTRODUODENOSCOPY (EGD) (Left) as a surgical intervention.  The patient's history has been reviewed, patient examined, no change in status, stable for surgery.  I have reviewed the patient's chart and labs.  Questions were answered to the patient's satisfaction.     Verlin Dike Wendle Kina

## 2023-02-22 NOTE — Progress Notes (Signed)
[facility] Progress Note  CC: Diarrhea  Subjective: She remains NPO for EGD this afternoon.  No nausea or vomiting.  No abdominal pain.  She passed small quantities of stool while she urinated several times during the night and 3 nonbloody blood like loose stools today.  No chest pain or shortness of breath.  Objective:   Small bowel capsule endoscopy, capsule administered 1/20, results completed 02/21/2023. 1) Complete capsule study with adequate prep 2) Delayed passage of the capsule through the esophagus with first gastric image at 21 minutes and 43 seconds.  Possible lower esophageal stricture. 3) Mild gastritis with mucosal edema on limited gastric views. 4) Mildly edematous mucosa in the very proximal small bowel which normalized in the duodenum, approximately 2 minutes after the pylorus.  The remainder of the proximal and mid small bowel were all normal. 5) Single subtle erosion in the distal small bowel at 4 hours 35 minutes.  Focal area of  very mildly edematous mucosa in this area. There was no associated  villous atrophy, stricture, or clear ulceration .  This rapidly normalized and the remainder of the small bowel was normal-appearing 6) First duodenal image at 49 minutes.  First cecal image at 4 hours 46 minutes.    Vital signs in last 24 hours: Temp:  [98.2 F (36.8 C)-98.8 F (37.1 C)] 98.2 F (36.8 C) (01/22 0254) Pulse Rate:  [63-71] 63 (01/22 0254) Resp:  [14-17] 17 (01/22 0254) BP: (151-158)/(69-79) 154/69 (01/22 0254) SpO2:  [96 %-100 %] 96 % (01/22 0254) Last BM Date : 02/21/23 General: 83 year old female in no acute distress. Heart: Regular rate and rhythm.  No murmurs. Pulm: Breath sounds clear throughout. Abdomen: Left, nondistended.  Nontender.  Positive bowel sounds to all 4 quadrants. Extremities: Less lower extremity edema today. Neurologic:  Alert and  oriented x 4. Grossly normal neurologically. Psych:  Alert and cooperative. Normal  mood and affect.  Intake/Output from previous day: 01/21 0701 - 01/22 0700 In: 960 [P.O.:960] Out: -  Intake/Output this shift: Total I/O In: 80 [P.O.:80] Out: -   Lab Results: Recent Labs    02/20/23 0303 02/22/23 0423  WBC 5.8 4.3  HGB 9.0* 8.9*  HCT 28.6* 28.2*  PLT 232 198   BMET Recent Labs    02/20/23 0303 02/21/23 0418 02/22/23 0423  NA 141 138 139  K 3.7 4.0 3.7  CL 116* 114* 111  CO2 19* 20* 23  GLUCOSE 40* 77 128*  BUN 16 14 19   CREATININE 0.71 0.60 0.67  CALCIUM 8.4* 8.6* 8.7*   LFT No results for input(s): "PROT", "ALBUMIN", "AST", "ALT", "ALKPHOS", "BILITOT", "BILIDIR", "IBILI" in the last 72 hours. PT/INR No results for input(s): "LABPROT", "INR" in the last 72 hours. Hepatitis Panel No results for input(s): "HEPBSAG", "HCVAB", "HEPAIGM", "HEPBIGM" in the last 72 hours.  No results found.  Assessment / Plan:  83 year old with type 1 diabetes, paroxysmal A-fib relative, admitted with recurrent subacute nearly chronic diarrhea at this point. Elevated fecal calprotectin. Low fecal elastase.  Negative GI pathogen panel and C. difficile.  Normal TSH.  Negative celiac panel.  CTAP 02/13/2023 showed fluid with scattered air-fluid levels throughout the colon without bowel wall inflammation, thickening or bowel obstruction. Small bowel capsule endoscopy showed mildly edematous mucosa in the very proximal small bowel and a short area of subtle inflammation in the distal small bowel otherwise the remainder of the small bowel was normal. There was delayed passage of the capsule through the esophagus  concerning for possible lower esophageal stricture. EGD scheduled today to rule out esophageal stricture, gastritis and proximal duodenitis. -NPO -Continue to hold Xarelto  -Proceed with EGD today as scheduled  -Continue Rifaximin 3 times daily and Creon 3 times daily with meals -Continue Cholestyramine 4 g pack p.o. 3 times daily -Continue Lomotil 1 tab p.o. 4 times  daily -Further GI recommendations to be determined after EGD completed   Iron deficiency anemia. Stable Hg 9.0 -> today Hg 8.9.  Iron 20.  Saturation ratios 8.  Ferritin 19.  No overt GI bleeding.  -CBC in am -Consider IV iron during this admission then oral iron as an outpatient     Principal Problem:   Subacute diarrhea Active Problems:   Acute metabolic acidosis   AKI (acute kidney injury) (HCC)   Type 1 diabetes mellitus (HCC)   Persistent atrial fibrillation (HCC)   Normocytic anemia     LOS: 6 days   Arnaldo Natal  02/22/2023, 11:43 AM

## 2023-02-22 NOTE — Plan of Care (Signed)
  Problem: Nutrition: Goal: Adequate nutrition will be maintained Outcome: Progressing   Problem: Pain Management: Goal: General experience of comfort will improve Outcome: Progressing   Problem: Safety: Goal: Ability to remain free from injury will improve Outcome: Progressing   Problem: Skin Integrity: Goal: Risk for impaired skin integrity will decrease Outcome: Progressing

## 2023-02-22 NOTE — Op Note (Signed)
[facility] Patient Name: Donna Wiley Procedure Date: 02/22/2023 MRN: [id number] Attending MD: Doristine Locks , MD, [id number] Date of Birth: 1940/10/24 CSN: [id number] Age: 83 Admit Type: Inpatient Procedure:                Upper GI endoscopy w/ biopsy Indications:              Abnormal video capsule endoscopy, Diarrhea Providers:                Doristine Locks, MD, Stephens Shire RN, RN, Rozetta Nunnery, Technician Referring MD:              Medicines:                Monitored Anesthesia Care Complications:            No immediate complications. Estimated Blood Loss:     Estimated blood loss was minimal. Procedure:                Pre-Anesthesia Assessment:                           - Prior to the procedure, a History and Physical                            was performed, and patient medications and                            allergies were reviewed. The patient's tolerance of                            previous anesthesia was also reviewed. The risks                            and benefits of the procedure and the sedation                            options and risks were discussed with the patient.                            All questions were answered, and informed consent                            was obtained. Prior Anticoagulants: The patient has                            taken Xarelto (rivaroxaban), last dose was 2 days                            prior to procedure. ASA Grade Assessment: III - A                            patient with severe systemic disease. After  reviewing the risks and benefits, the patient was                            deemed in satisfactory condition to undergo the                            procedure.                           After obtaining informed consent, the endoscope was                            passed under direct vision. Throughout the                             procedure, the patient's blood pressure, pulse, and                            oxygen saturations were monitored continuously. The                            GIF-H190 ([id number]) Olympus endoscope was introduced                            through the mouth, and advanced to the second part                            of duodenum. The upper GI endoscopy was                            accomplished without difficulty. The patient                            tolerated the procedure well. Scope In: Scope Out: Findings:      The examined esophagus was normal. There is no evidence of esophageal       stricture, luminal narrowing, or other indication for esophageal       dilation today.      The Z-line was regular and was found 35 cm from the incisors.      The gastroesophageal flap valve was visualized endoscopically and       classified as Hill Grade III (minimal fold, loose to endoscope, hiatal       hernia likely).      Minimal inflammation characterized by congestion (edema) and erythema       was found in the gastric fundus, in the gastric body and in the gastric       antrum. Biopsies were taken with a cold forceps for histology and       Helicobacter pylori testing. Estimated blood loss was minimal.      The examined duodenum was normal. Biopsies were taken with a cold       forceps for histology. Estimated blood loss was minimal. Impression:               - Normal esophagus.                           -  Z-line regular, 35 cm from the incisors.                           - Gastroesophageal flap valve classified as Hill                            Grade III (minimal fold, loose to endoscope, hiatal                            hernia likely).                           - Minimal, non-ulcer gastritis. Biopsied.                           - Normal examined duodenum. Biopsied. Moderate Sedation:      Not Applicable - Patient had care per Anesthesia. Recommendation:           - Return patient to  hospital ward for ongoing care.                           - Advance diet as tolerated.                           - Continue present medications as prescribed.                           - Resume Xarelto (rivaroxaban) at prior dose                            tomorrow.                           - Await pathology results. Procedure Code(s):        --- Professional ---                           [phone], Esophagogastroduodenoscopy, flexible,                            transoral; with biopsy, single or multiple Diagnosis Code(s):        --- Professional ---                           K29.70, Gastritis, unspecified, without bleeding                           R19.7, Diarrhea, unspecified                           R93.3, Abnormal findings on diagnostic imaging of                            other parts of digestive tract CPT copyright 2022 American Medical Association. All rights reserved. The codes documented in this report are preliminary and upon coder review may  be revised to meet current compliance requirements. Doristine Locks, MD  02/22/2023 1:11:43 PM Number of Addenda: 0

## 2023-02-22 NOTE — Transfer of Care (Signed)
Immediate Anesthesia Transfer of Care Note  Patient: Neva Prue  Procedure(s) Performed: ESOPHAGOGASTRODUODENOSCOPY (EGD) (Left) BIOPSY  Patient Location: Endoscopy Unit  Anesthesia Type:MAC  Level of Consciousness: awake and patient cooperative  Airway & Oxygen Therapy: Patient Spontanous Breathing and Patient connected to face mask  Post-op Assessment: Report given to RN and Post -op Vital signs reviewed and stable  Post vital signs: Reviewed and stable  Last Vitals:  Vitals Value Taken Time  BP    Temp    Pulse    Resp    SpO2      Last Pain:  Vitals:   02/22/23 1203  TempSrc: Tympanic  PainSc: 0-No pain         Complications: No notable events documented.

## 2023-02-22 NOTE — Assessment & Plan Note (Signed)
patient hospitalized in December with similar symptoms, underwent a colonoscopy on 12/13 which showed normal terminal ileum, normal colon, random biopsies taken and was negative for microscopic colitis -Recent workup was negative for stool studies -Symptoms improved with cholestyramine as well as Lomotil following most recent discharge, but as soon as she finished these medications, diarrhea returned -Etiology not clear, GI consulted.  Started on rifaximin, continue.  She had a low fecal elastase suggesting pancreatic insufficiency but could be fictitiously low due to dilution per gastroenterology.  Regardless, she was started on Creon.  Continue to monitor, underwent a capsule Study yesterday, read pending

## 2023-02-22 NOTE — Plan of Care (Signed)
  Problem: Education: Goal: Knowledge of General Education information will improve Description: Including pain rating scale, medication(s)/side effects and non-pharmacologic comfort measures Outcome: Progressing   Problem: Health Behavior/Discharge Planning: Goal: Ability to manage health-related needs will improve Outcome: Progressing   Problem: Clinical Measurements: Goal: Ability to maintain clinical measurements within normal limits will improve Outcome: Progressing   Problem: Elimination: Goal: Will not experience complications related to bowel motility Outcome: Progressing

## 2023-02-23 ENCOUNTER — Other Ambulatory Visit: Payer: Self-pay

## 2023-02-23 ENCOUNTER — Encounter: Payer: Self-pay | Admitting: Gastroenterology

## 2023-02-23 ENCOUNTER — Other Ambulatory Visit (HOSPITAL_COMMUNITY): Payer: Self-pay

## 2023-02-23 ENCOUNTER — Telehealth (HOSPITAL_COMMUNITY): Payer: Self-pay | Admitting: Pharmacy Technician

## 2023-02-23 DIAGNOSIS — K529 Noninfective gastroenteritis and colitis, unspecified: Secondary | ICD-10-CM | POA: Diagnosis not present

## 2023-02-23 LAB — GLUCOSE, CAPILLARY
Glucose-Capillary: 146 mg/dL — ABNORMAL HIGH (ref 70–99)
Glucose-Capillary: 61 mg/dL — ABNORMAL LOW (ref 70–99)
Glucose-Capillary: 63 mg/dL — ABNORMAL LOW (ref 70–99)
Glucose-Capillary: 68 mg/dL — ABNORMAL LOW (ref 70–99)

## 2023-02-23 LAB — SURGICAL PATHOLOGY

## 2023-02-23 MED ORDER — RIFAXIMIN 550 MG PO TABS
550.0000 mg | ORAL_TABLET | Freq: Three times a day (TID) | ORAL | 0 refills | Status: DC
  Filled 2023-02-23: qty 90, 30d supply, fill #0

## 2023-02-23 MED ORDER — FERROUS SULFATE 325 (65 FE) MG PO TABS: 325.0000 mg | ORAL_TABLET | ORAL | Status: DC

## 2023-02-23 MED ORDER — PANCRELIPASE (LIP-PROT-AMYL) 36000-114000 UNITS PO CPEP
72000.0000 [IU] | ORAL_CAPSULE | Freq: Three times a day (TID) | ORAL | 0 refills | Status: DC
  Filled 2023-02-23: qty 100, 17d supply, fill #0

## 2023-02-23 MED ORDER — DIPHENOXYLATE-ATROPINE 2.5-0.025 MG PO TABS
1.0000 | ORAL_TABLET | Freq: Four times a day (QID) | ORAL | 0 refills | Status: DC
  Filled 2023-02-23: qty 30, 8d supply, fill #0

## 2023-02-23 NOTE — Progress Notes (Signed)
AVS reviewed w/ pt & daughter - pt and daughter confirmed patient does not have  legal guardian. No other questions at this time.Pt asked about moving over to Dr Frankey Shown practice if she can not get into her current GI. Pt to follow up w/ [facility]. PIV removed as noted. TOC discharge meds in a secure bag delivered to pt  in room by this RN Pt dressed for d/c to home. To lobby via w/c - home w/ daughter.

## 2023-02-23 NOTE — Discharge Summary (Signed)
Physician Discharge Summary   Patient: Donna Wiley MRN: [id number] DOB: 1940-08-28  Admit date:     02/13/2023  Discharge date: 02/23/23  Discharge Physician: Alberteen Sam   PCP: Raquel James, MD     Recommendations at discharge:  Follow up with PCP Dr. Carlene Coria for diarrhea in 1 week Follow up with GI Dr. Lanae Boast as soon as able for subacute diarrhea Dr. Carlene Coria: Please refill cholestyramine, Xifaxan, Lomotil and Creon as appropriate Please check iron levels in 1 month     Discharge Diagnoses: Principal Problem:   Subacute diarrhea Active Problems:   Acute metabolic acidosis   AKI (acute kidney injury) (HCC)   Type 1 diabetes mellitus (HCC)   Persistent atrial fibrillation (HCC)   Normocytic anemia   Gastroesophageal reflux disease without esophagitis      Hospital Course: 83 y.o. F with T1DM on insulin pump, pAF on Xarelto and iron deficiency anemia who presented with persistent diarrhea.   * Subacute diarrhea The patient was hospitalized last December for similar symptoms, underwent colonoscopy which showed normal terminal ileum, normal colon, and biopsies negative for microscopic colitis or inflammatory bowel disease.  She was discharged on cholestyramine and Lomotil, and symptoms were initially somewhat better, but then in the last 1 to 2 weeks, she was having severe diarrhea again (up to 13-15 bowel movements per day).   She returned to the hospital, GI were consulted again, and she was noted to have low fecal elastase.  She underwent capsule study and EGD, both of which were fairly unremarkable.  She was started on rifaximin for empiric treatment of SIBO, Creon due to low fecal elastase (for empiric treatment of pancreatic insufficiency).  Her Lomotil and cholestyramine were continued.    At the time of discharge, the patient's bowel movements had reduced to 3 to 4/day, still quite loose and at times with urgency and accidents.  She was  discharged to follow-up with her home GI, Dr. Lanae Boast soon as possible    AKI (acute kidney injury) (HCC) NAGMA Creatinine 2.8 on admission, treated with fluids and resolved to normal.  Acidosis due to diarrhea and AKI.  Resolved with treatment of both  Iron deficiency anemia Iron levels somewhat low, started on oral iron.  Recommend outpatient follow-up.  Atrial fibrillation Continued on flecainide and Xarelto         [facility] Controlled Substances Registry was reviewed for this patient prior to discharge.  Consultants: Gastroenterology, Dr. Barron Alvine  Procedures performed:  EGD Small bowel capsule enteroscopy  Disposition: Home Diet recommendation:  Discharge Diet Orders (From admission, onward)     Start     Ordered   02/23/23 0000  Diet - low sodium heart healthy        02/23/23 0942             DISCHARGE MEDICATION: Allergies as of 02/23/2023       Reactions   Iodine Anaphylaxis   Ivp Dye [iodinated Contrast Media] Anaphylaxis   Nickel Rash   Charentais Melon (french Melon) Swelling   Cherry Swelling   Codeine Other (See Comments)   Hallucinations    Other Swelling   Tree nuts   Shellfish Allergy Swelling   Sulfa Antibiotics Swelling   Tape Dermatitis   Paper tape is best PAPER TAPE        Medication List     TAKE these medications    acetaminophen 500 MG tablet Commonly known as: TYLENOL Take 1,000 mg by mouth every  6 (six) hours as needed for mild pain (pain score 1-3) or moderate pain (pain score 4-6).   b complex vitamins capsule Take 1 capsule by mouth every evening.   calcium citrate 950 (200 Ca) MG tablet Commonly known as: CALCITRATE - dosed in mg elemental calcium Take 500 mg of elemental calcium by mouth 2 (two) times daily.   cholestyramine 4 g packet Commonly known as: QUESTRAN Take 1 packet (4 g total) by mouth 2 (two) times daily for 14 days. What changed: when to take this Notes to patient: Mix with 4-6  oz liquid. Take other meds 1 hr before or 4-6 hr after cholestyramine   clobetasol cream 0.05 % Commonly known as: TEMOVATE Apply 1 Application topically 2 (two) times daily as needed (Dermatitis).   diltiazem 180 MG 24 hr capsule Commonly known as: CARDIZEM CD Take 180 mg by mouth at bedtime.   diphenoxylate-atropine 2.5-0.025 MG tablet Commonly known as: LOMOTIL Take 1 tablet by mouth 4 (four) times daily.   EPINEPHrine 0.3 mg/0.3 mL Soaj injection Commonly known as: EPI-PEN Inject 0.3 mg into the muscle as needed for anaphylaxis.   ferrous sulfate 325 (65 FE) MG tablet Take 1 tablet (325 mg total) by mouth every other day. Start taking on: February 24, 2023   flecainide 50 MG tablet Commonly known as: TAMBOCOR Take 50-100 mg by mouth See admin instructions. Take 50mg  (1 tablet) by mouth every morning and 100mg  (2 tablets) every evening.   insulin lispro 100 UNIT/ML injection Commonly known as: HUMALOG Inject 0-15 Units into the skin 4 (four) times daily. Per pump   lipase/protease/amylase 40981 UNITS Cpep capsule Commonly known as: CREON Take 2 capsules (72,000 Units total) by mouth 3 (three) times daily with meals.   LORazepam 0.5 MG tablet Commonly known as: ATIVAN Take 0.5 mg by mouth at bedtime.   Myrbetriq 50 MG Tb24 tablet Generic drug: mirabegron ER Take 50 mg by mouth daily.   olmesartan-hydrochlorothiazide 40-12.5 MG tablet Commonly known as: BENICAR HCT Take 1 tablet by mouth in the morning.   ondansetron 4 MG tablet Commonly known as: ZOFRAN Take 1 tablet (4 mg total) by mouth every 6 (six) hours. What changed:  when to take this reasons to take this   pantoprazole 40 MG tablet Commonly known as: PROTONIX Take 40 mg by mouth daily.   rifaximin 550 MG Tabs tablet Commonly known as: XIFAXAN Take 1 tablet (550 mg total) by mouth 3 (three) times daily.   rivaroxaban 20 MG Tabs tablet Commonly known as: XARELTO Take 20 mg by mouth every  evening.   simvastatin 20 MG tablet Commonly known as: ZOCOR Take 10 mg by mouth at bedtime.        Follow-up Information     Raquel James, MD. Schedule an appointment as soon as possible for a visit in 1 week(s).   Specialty: Family Medicine Contact information: [address] [place] [address] [phone]         Doristine Locks V, DO Follow up.   Specialty: Gastroenterology Why: Call for biopsy results Contact information: [address] [place] [address] [phone]         Nelida Gores [place], [place]. Schedule an appointment as soon as possible for a visit in 2 week(s).   Specialty: Gastroenterology Why: For GI follow up Contact information: [address] [place] [address] [phone]  Discharge Instructions     Diet - low sodium heart healthy   Complete by: As directed    Discharge instructions   Complete by: As directed    **IMPORTANT DISCHARGE INSTRUCTIONS**   From Dr. Maryfrances Bunnell: You were admitted for diarrhea  Here, you had an upper endoscopy to look at the stomach, and the results did not reveal anything new yet.  Dr. Barron Alvine took biopsies during the procedure, and you should follow up these biopsies with him  You should schedule a Gastroenterology office visit with your primary gastroenterologist at [facility] Please call Dawn Hilliard's office to schedule this (number below)  In the meantime, continue the cholestyramine that you were taking to reduce diarrhea.  Cholestyramine works by binding bile acids, on the theory that your diarrhea is from excess bile in the colon  Also, add three new medicines: Add Lomotil, Creon, and Xifaxan  Lomotil is a general diarrhea medicine, which works by slowing the bowels overall  Creon is a pancreas enzyme, which works on the [employer] that your diarrhea is from imbalanced pancreas enzymes  Xifaxan works on the [employer] that your  diarrhea is from bacterial overgrowth (Xifaxan kills excess bacteria in the intestinal tract)  You should also start iron  Have your primary doctor check your iron levels and blood level (hemoglobin) in 4 weeks  Resume your other home medicines  You will need refills of the new Xifaxan, lomotil, and Creon from your primary doctor   Increase activity slowly   Complete by: As directed        Discharge Exam: Filed Weights   02/13/23 2219  Weight: 54.8 kg    General: Pt is alert, awake, not in acute distress Cardiovascular: RRR, nl S1-S2, no murmurs appreciated.   No LE edema.   Respiratory: Normal respiratory rate and rhythm.  CTAB without rales or wheezes. Abdominal: Abdomen soft and non-tender.  No distension or HSM.   Neuro/Psych: Strength symmetric in upper and lower extremities.  Judgment and insight appear normal.   Condition at discharge: fair  The results of significant diagnostics from this hospitalization (including imaging, microbiology, ancillary and laboratory) are listed below for reference.   Imaging Studies: CT ABDOMEN PELVIS WO CONTRAST Result Date: 02/13/2023 CLINICAL DATA:  Recurrent and persistent diarrhea EXAM: CT ABDOMEN AND PELVIS WITHOUT CONTRAST TECHNIQUE: Multidetector CT imaging of the abdomen and pelvis was performed following the standard protocol without IV contrast. RADIATION DOSE REDUCTION: This exam was performed according to the departmental dose-optimization program which includes automated exposure control, adjustment of the mA and/or kV according to patient size and/or use of iterative reconstruction technique. COMPARISON:  02/04/2023 FINDINGS: Lower chest: No acute abnormality. Hepatobiliary: No focal liver abnormality is seen. Status post cholecystectomy. No biliary dilatation. Pancreas: No focal abnormality or ductal dilatation. Spleen: No focal abnormality.  Normal size. Adrenals/Urinary Tract: No adrenal abnormality. No focal renal abnormality.  No stones or hydronephrosis. Urinary bladder is unremarkable. Stomach/Bowel: Fluid throughout the large intestine may be related to diarrhea. No bowel wall thickening or evidence of obstruction. Vascular/Lymphatic: Aortoiliac atherosclerosis. No evidence of aneurysm or adenopathy. Reproductive: Prior hysterectomy.  No adnexal masses. Other: No free fluid or free air. Musculoskeletal: No acute bony abnormality. IMPRESSION: Fluid with scattered air-fluid levels throughout the colon compatible with given history of diarrhea. No evidence of bowel inflammation, wall thickening or bowel obstruction. Electronically Signed   By: Charlett Nose M.D.   On: 02/13/2023 17:50   CT ABDOMEN PELVIS WO CONTRAST Result Date: 02/04/2023 CLINICAL  DATA:  Nausea and vomiting EXAM: CT ABDOMEN AND PELVIS WITHOUT CONTRAST TECHNIQUE: Multidetector CT imaging of the abdomen and pelvis was performed following the standard protocol without IV contrast. RADIATION DOSE REDUCTION: This exam was performed according to the departmental dose-optimization program which includes automated exposure control, adjustment of the mA and/or kV according to patient size and/or use of iterative reconstruction technique. COMPARISON:  01/06/2023 FINDINGS: Lower chest: No acute abnormality. Hepatobiliary: No focal liver abnormality is seen. Status post cholecystectomy. No biliary dilatation. Pancreas: Unremarkable. No pancreatic ductal dilatation or surrounding inflammatory changes. Spleen: Normal in size without focal abnormality. Adrenals/Urinary Tract: Adrenal glands are within normal limits. Kidneys demonstrate a normal appearance bilaterally. No renal calculi or obstructive changes are seen. The bladder is decompressed. Stomach/Bowel: No obstructive or inflammatory changes of the colon are noted. The appendix has been surgically removed. Small bowel shows fluid throughout without obstructive change. This may represent some mild enteritis. Vascular/Lymphatic:  Aortic atherosclerosis. Stable stranding in the mesentery is noted with scattered small nodes. The overall appearance is similar to that seen on the prior exam. Reproductive: Status post hysterectomy. No adnexal masses. Other: No abdominal wall hernia or abnormality. No abdominopelvic ascites. Musculoskeletal: No acute or significant osseous findings. IMPRESSION: Fluid throughout the small bowel which may represent some mild enteritis. Stable appearance of mesenteric stranding with small lymph nodes. No significant progression is noted. This may simply represent mesenteric adenitis. Electronically Signed   By: Alcide Clever M.D.   On: 02/04/2023 17:26    Microbiology: Results for orders placed or performed during the hospital encounter of 02/13/23  Gastrointestinal Panel by PCR , Stool     Status: None   Collection Time: 02/13/23  4:14 PM   Specimen: Stool  Result Value Ref Range Status   Campylobacter species NOT DETECTED NOT DETECTED Final   Plesimonas shigelloides NOT DETECTED NOT DETECTED Final   Salmonella species NOT DETECTED NOT DETECTED Final   Yersinia enterocolitica NOT DETECTED NOT DETECTED Final   Vibrio species NOT DETECTED NOT DETECTED Final   Vibrio cholerae NOT DETECTED NOT DETECTED Final   Enteroaggregative E coli (EAEC) NOT DETECTED NOT DETECTED Final   Enteropathogenic E coli (EPEC) NOT DETECTED NOT DETECTED Final   Enterotoxigenic E coli (ETEC) NOT DETECTED NOT DETECTED Final   Shiga like toxin producing E coli (STEC) NOT DETECTED NOT DETECTED Final   Shigella/Enteroinvasive E coli (EIEC) NOT DETECTED NOT DETECTED Final   Cryptosporidium NOT DETECTED NOT DETECTED Final   Cyclospora cayetanensis NOT DETECTED NOT DETECTED Final   Entamoeba histolytica NOT DETECTED NOT DETECTED Final   Giardia lamblia NOT DETECTED NOT DETECTED Final   Adenovirus F40/41 NOT DETECTED NOT DETECTED Final   Astrovirus NOT DETECTED NOT DETECTED Final   Norovirus GI/GII NOT DETECTED NOT DETECTED  Final   Rotavirus A NOT DETECTED NOT DETECTED Final   Sapovirus (I, II, IV, and V) NOT DETECTED NOT DETECTED Final    Comment: Performed at [facility], [address] [place], [place] [address]  C Difficile Quick Screen w PCR reflex     Status: None   Collection Time: 02/13/23  4:15 PM   Specimen: Stool  Result Value Ref Range Status   C Diff antigen NEGATIVE NEGATIVE Final   C Diff toxin NEGATIVE NEGATIVE Final   C Diff interpretation No C. difficile detected.  Final    Comment: Performed at [facility], [address], [place], [place] [address]  Calprotectin, Fecal     Status:  Abnormal   Collection Time: 02/14/23  6:02 PM   Specimen: Stool  Result Value Ref Range Status   Calprotectin, Fecal 274 (H) 0 - 120 ug/g Final    Comment: (NOTE) Concentration     Interpretation   Follow-Up < 5 - 50 ug/g     Normal           None >50 -120 ug/g     Borderline       Re-evaluate in 4-6 weeks    >120 ug/g     Abnormal         Repeat as clinically                                   indicated Performed At: [facility] [address] [place], [place] [id number] Jolene Schimke MD HY:[id number]     Labs: CBC: Recent Labs  Lab 02/17/23 0432 02/18/23 0433 02/19/23 0417 02/20/23 0303 02/22/23 0423  WBC 5.0 5.0 5.3 5.8 4.3  HGB 8.7* 9.3* 9.0* 9.0* 8.9*  HCT 28.0* 29.2* 28.6* 28.6* 28.2*  MCV 91.8 92.7 90.2 90.8 91.0  PLT 217 226 226 232 198   Basic Metabolic Panel: Recent Labs  Lab 02/17/23 0432 02/18/23 0433 02/19/23 0417 02/20/23 0303 02/21/23 0418 02/22/23 0423  NA 138 138 137 141 138 139  K 3.6 3.8 3.6 3.7 4.0 3.7  CL 118* 118* 116* 116* 114* 111  CO2 14* 16* 18* 19* 20* 23  GLUCOSE 219* 86 103* 40* 77 128*  BUN 14 15 20 16 14 19   CREATININE 0.61 0.47 0.64 0.71 0.60 0.67  CALCIUM 7.9* 8.0* 8.1* 8.4* 8.6* 8.7*  MG 1.3* 1.9 1.7 1.9 1.9 1.6*  PHOS 2.9  --  2.9 2.8  --  3.6   Liver Function Tests: Recent Labs  Lab 02/17/23 0432 02/18/23 0433   AST 19 19  ALT 16 16  ALKPHOS 38 38  BILITOT 0.3 0.4  PROT 4.4* 4.4*  ALBUMIN 2.4* 2.5*   CBG: Recent Labs  Lab 02/22/23 2027 02/23/23 0401 02/23/23 0739 02/23/23 0804 02/23/23 0823  GLUCAP 78 146* 63* 61* 68*    Discharge time spent: approximately 45 minutes spent on discharge counseling, evaluation of patient on day of discharge, and coordination of discharge planning with nursing, social work, pharmacy and case management  Signed: Alberteen Sam, MD [facility] 02/23/2023

## 2023-02-23 NOTE — Progress Notes (Addendum)
Attending physician's note   I have reviewed the chart and discussed her care on rounds. I performed a substantive portion of this encounter, including complete performance of at least one of the key components, in conjunction with the APP. I agree with the APP's note, impression, and recommendations with my edits.  Plan for discharge today.  Can follow-up with her primary GI as outpatient.  Addendum: Pathology results returned after patient discharged and notable for acute duodenitis and minimal chronic gastritis, but negative for H. pylori, metaplasia, dysplasia.  No chronic inflammatory features found on duodenal biopsies to suggest celiac disease or other similar chronic pathology.  Letter mailed to the patient for her records.  Soumya Colson, DO, FACG [phone] office          Progress Note  Primary GI: [facility]  LOS: 7 days   Chief Complaint: Diarrhea and anemia   Subjective   Upon my evaluation this morning patient stated that she had a soft formed bowel movement without difficulty.  She stated it was one of the best bowel movements she has had since she has been hospitalized.  Soon after I left, during her visit with Dr. Maryfrances Bunnell she noted extreme urgency and had an explosive bowel movement which was similar to the one she had when she initially came in and this was concerning for her.  Though she still states that she called her daughter and would like to leave today.  She is compliant with her Creon with meals, cholestyramine 3 times daily, but she takes Imodium once a day.   Objective   Vital signs in last 24 hours: Temp:  [97 F (36.1 C)-98.5 F (36.9 C)] 98.5 F (36.9 C) (01/23 0819) Pulse Rate:  [67-79] 71 (01/23 0819) Resp:  [11-21] 17 (01/23 0400) BP: (132-165)/(56-98) 153/74 (01/23 0819) SpO2:  [95 %-99 %] 98 % (01/23 0819) Last BM Date : 02/23/23 Last BM recorded by nurses in past 5 days Stool Type: Type 5 (Soft blobs with clear-cut edges)  (02/23/2023  8:19 AM)  General:   female in no acute distress  Heart:  Regular rate and rhythm; no murmurs Pulm: Clear anteriorly; no wheezing Abdomen: soft, nondistended, normal bowel sounds in all quadrants. Nontender without guarding. No organomegaly appreciated. Extremities:  No edema Neurologic:  Alert and  oriented x4;  No focal deficits.  Psych:  Cooperative. Normal mood and affect.  Intake/Output from previous day: 01/22 0701 - 01/23 0700 In: 180 [P.O.:80; I.V.:100] Out: -  Intake/Output this shift: Total I/O In: -  Out: 4 [Urine:1; Stool:3]  Studies/Results: No results found.  Lab Results: Recent Labs    02/22/23 0423  WBC 4.3  HGB 8.9*  HCT 28.2*  PLT 198   BMET Recent Labs    02/21/23 0418 02/22/23 0423  NA 138 139  K 4.0 3.7  CL 114* 111  CO2 20* 23  GLUCOSE 77 128*  BUN 14 19  CREATININE 0.60 0.67  CALCIUM 8.6* 8.7*   LFT No results for input(s): "PROT", "ALBUMIN", "AST", "ALT", "ALKPHOS", "BILITOT", "BILIDIR", "IBILI" in the last 72 hours. PT/INR No results for input(s): "LABPROT", "INR" in the last 72 hours.   Scheduled Meds:  cholestyramine light  4 g Oral TID   diphenoxylate-atropine  1 tablet Oral QID   feeding supplement  237 mL Oral TID BM   ferrous sulfate  325 mg Oral QODAY   flecainide  50 mg Oral Daily   And   flecainide  100 mg Oral QHS   insulin pump   Subcutaneous TID WC, HS, 0200   lipase/protease/amylase  72,000 Units Oral TID WC   LORazepam  1 mg Oral QHS   rifaximin  550 mg Oral TID   sodium bicarbonate  650 mg Oral TID   Continuous Infusions:   Impression   83 year old with type 1 diabetes, paroxysmal A-fib relative, admitted with recurrent subacute nearly chronic diarrhea at this point. Elevated fecal calprotectin. Low fecal elastase.  Negative GI pathogen panel and C. difficile.  Normal TSH.  Negative celiac panel.  Colonoscopy 12/9 with entire examined colon normal with normal biopsies.CTAP 02/13/2023 showed fluid  with scattered air-fluid levels throughout the colon without bowel wall inflammation, thickening or bowel obstruction. Small bowel capsule endoscopy showed mildly edematous mucosa in the very proximal small bowel and a short area of subtle inflammation in the distal small bowel otherwise the remainder of the small bowel was normal. There was delayed passage of the capsule through the esophagus concerning for possible lower esophageal stricture.   Persistent diarrhea  Extensive workup bleeding colonoscopy, EGD, capsule endoscopy, stool studies, CT.  Her bowel movements were improving overall but she did have 1 regressive bowel movement after my evaluation today described as explosive diarrhea.  She notes she is only taking Lomotil 1 tablet daily.  Suspect increasing her Lomotil in addition to continuing her Creon and cholestyramine may continue to improve her bowel movements.  Suspect she is an adequate candidate to be discharged and follow-up with her primary outpatient GI.  Will refer this to hospitalist and patient to decide.  No further inpatient workup from GI at this time most likely.  Will defer to Dr. Barron Alvine for any further recommendations    Plan:   - Can resume Xarelto - Recommend scheduled Lomotil doses 4 tablets/day (currently on 1 tablet/day) - Continue cholestyramine and Creon - If patient ultimately gets discharged would recommend outpatient follow-up with her primary GI ([facility]) - Any further recommendations please await Dr. Murtis Sink M McMichael  02/23/2023, 9:51 AM

## 2023-02-23 NOTE — Care Management Important Message (Signed)
Important Message  Patient Details IM Letter given. Name: Donna Wiley MRN: [id number] Date of Birth: Jun 26, 1940   Important Message Given:        [name] 02/23/2023, 2:49 PM

## 2023-02-23 NOTE — Telephone Encounter (Signed)
Pharmacy Patient Advocate Encounter  Received notification from [facility] that Prior Authorization for Xifaxan 550MG  tablets has been APPROVED from 02/23/2023 to 01/31/2024   PA #/Case ID/Reference #: [id number] Key: [id number]

## 2023-02-23 NOTE — Plan of Care (Signed)
  Problem: Safety: Goal: Ability to remain free from injury will improve Outcome: Progressing   Problem: Nutritional: Goal: Maintenance of adequate nutrition will improve Outcome: Progressing   Problem: Skin Integrity: Goal: Risk for impaired skin integrity will decrease Outcome: Progressing

## 2023-02-24 ENCOUNTER — Encounter (HOSPITAL_COMMUNITY): Payer: Self-pay | Admitting: Gastroenterology

## 2023-02-24 ENCOUNTER — Other Ambulatory Visit: Payer: Self-pay | Admitting: Family Medicine

## 2023-02-24 MED ORDER — CHOLESTYRAMINE 4 G PO PACK: 4.0000 g | PACK | Freq: Two times a day (BID) | ORAL | 0 refills | Status: DC

## 2023-02-24 NOTE — Progress Notes (Signed)
Patient called hospital explaining that she had lost her cholestyramine.  Refill sent.

## 2023-02-26 ENCOUNTER — Other Ambulatory Visit: Payer: Self-pay

## 2023-02-26 ENCOUNTER — Emergency Department (HOSPITAL_COMMUNITY)
Admission: EM | Admit: 2023-02-26 | Discharge: 2023-02-26 | Disposition: A | Discharge status: Home / Self Care | Payer: Medicare PPO | Attending: Emergency Medicine | Admitting: Emergency Medicine

## 2023-02-26 DIAGNOSIS — R197 Diarrhea, unspecified: Secondary | ICD-10-CM | POA: Diagnosis present

## 2023-02-26 LAB — COMPREHENSIVE METABOLIC PANEL
ALT: 26 U/L (ref 0–44)
AST: 32 U/L (ref 15–41)
Albumin: 3.7 g/dL (ref 3.5–5.0)
Alkaline Phosphatase: 50 U/L (ref 38–126)
Anion gap: 9 (ref 5–15)
BUN: 19 mg/dL (ref 8–23)
CO2: 20 mmol/L — ABNORMAL LOW (ref 22–32)
Calcium: 9.5 mg/dL (ref 8.9–10.3)
Chloride: 111 mmol/L (ref 98–111)
Creatinine, Ser: 0.88 mg/dL (ref 0.44–1.00)
GFR, Estimated: >60 mL/min (ref >60)
Glucose, Bld: 86 mg/dL (ref 70–99)
Potassium: 3.6 mmol/L (ref 3.5–5.1)
Sodium: 140 mmol/L (ref 135–145)
Total Bilirubin: 0.5 mg/dL (ref 0.0–1.2)
Total Protein: 6.7 g/dL (ref 6.5–8.1)

## 2023-02-26 LAB — CBC
HCT: 34.8 % — ABNORMAL LOW (ref 36.0–46.0)
Hemoglobin: 11 g/dL — ABNORMAL LOW (ref 12.0–15.0)
MCH: 28.6 pg (ref 26.0–34.0)
MCHC: 31.6 g/dL (ref 30.0–36.0)
MCV: 90.4 fL (ref 80.0–100.0)
Platelets: 272 10*3/uL (ref 150–400)
RBC: 3.85 MIL/uL — ABNORMAL LOW (ref 3.87–5.11)
RDW: 14.4 % (ref 11.5–15.5)
WBC: 3.5 10*3/uL — ABNORMAL LOW (ref 4.0–10.5)
nRBC: 0 % (ref 0.0–0.2)

## 2023-02-26 LAB — LIPASE, BLOOD: Lipase: 21 U/L (ref 11–51)

## 2023-02-26 MED ORDER — ONDANSETRON 4 MG PO TBDP: 4.0000 mg | ORAL_TABLET | Freq: Three times a day (TID) | ORAL | 0 refills | Status: AC | PRN

## 2023-02-26 MED ORDER — SODIUM CHLORIDE 0.9 % IV BOLUS
1000.0000 mL | Freq: Once | INTRAVENOUS | Status: AC
  Administered 2023-02-26: 1000 mL via INTRAVENOUS

## 2023-02-26 NOTE — Discharge Instructions (Signed)
Consider using Imodium for couple days and set a Lomotil to see if that has any effect on your diarrhea.  This medication can be bought over-the-counter.  Continue medications otherwise as prescribed.

## 2023-02-26 NOTE — ED Provider Notes (Signed)
[facility] EMERGENCY DEPARTMENT AT [facility] Provider Note   CSN: [id number] Arrival date & time: 02/26/23  1145     History  Chief Complaint  Patient presents with   Diarrhea    Donna Wiley is a 83 y.o. female.  Patient here with ongoing diarrhea.  Discharged couple days ago after extensive workup inpatient.  She is on Creon and Questran and rifaximin.  She was having some improvement but missed some doses of medication and diarrhea got worse the last day or 2.  She feels dehydrated.  Has had some nausea but no vomiting.  Denies any fever chills weakness numbness tingling.  The history is provided by the patient.       Home Medications Prior to Admission medications   Medication Sig Start Date End Date Taking? Authorizing Provider  acetaminophen (TYLENOL) 500 MG tablet Take 1,000 mg by mouth every 6 (six) hours as needed for mild pain (pain score 1-3) or moderate pain (pain score 4-6).    [provider]  b complex vitamins capsule Take 1 capsule by mouth every evening.    [provider]  calcium citrate (CALCITRATE - DOSED IN MG ELEMENTAL CALCIUM) 950 (200 Ca) MG tablet Take 500 mg of elemental calcium by mouth 2 (two) times daily.    [provider]  cholestyramine (QUESTRAN) 4 g packet Take 1 packet (4 g total) by mouth 2 (two) times daily for 14 days. 02/24/23 03/10/23  Alberteen Sam, MD  clobetasol cream (TEMOVATE) 0.05 % Apply 1 Application topically 2 (two) times daily as needed (Dermatitis). 09/15/21   [provider]  diltiazem (CARDIZEM CD) 180 MG 24 hr capsule Take 180 mg by mouth at bedtime.    [provider]  diphenoxylate-atropine (LOMOTIL) 2.5-0.025 MG tablet Take 1 tablet by mouth 4 (four) times daily. 02/23/23   Danford, Earl Lites, MD  EPINEPHrine 0.3 mg/0.3 mL IJ SOAJ injection Inject 0.3 mg into the muscle as needed for anaphylaxis. 06/21/21   [provider]  ferrous sulfate  325 (65 FE) MG tablet Take 1 tablet (325 mg total) by mouth every other day. 02/24/23   Danford, Earl Lites, MD  flecainide (TAMBOCOR) 50 MG tablet Take 50-100 mg by mouth See admin instructions. Take 50mg  (1 tablet) by mouth every morning and 100mg  (2 tablets) every evening.    [provider]  insulin lispro (HUMALOG) 100 UNIT/ML injection Inject 0-15 Units into the skin 4 (four) times daily. Per pump    [provider]  lipase/protease/amylase (CREON) 36000 UNITS CPEP capsule Take 2 capsules (72,000 Units total) by mouth 3 (three) times daily with meals. 02/23/23   Danford, Earl Lites, MD  LORazepam (ATIVAN) 0.5 MG tablet Take 0.5 mg by mouth at bedtime. 12/10/22   [provider]  mirabegron ER (MYRBETRIQ) 50 MG TB24 tablet Take 50 mg by mouth daily. 07/13/21 02/14/23  [provider]  olmesartan-hydrochlorothiazide (BENICAR HCT) 40-12.5 MG tablet Take 1 tablet by mouth in the morning. 02/17/21   [provider]  ondansetron (ZOFRAN) 4 MG tablet Take 1 tablet (4 mg total) by mouth every 6 (six) hours. Patient taking differently: Take 4 mg by mouth every 6 (six) hours as needed for nausea or vomiting. 02/04/23   Arletha Pili, DO  pantoprazole (PROTONIX) 40 MG tablet Take 40 mg by mouth daily. Patient not taking: Reported on 02/14/2023    [provider]  rifaximin (XIFAXAN) 550 MG TABS tablet Take 1 tablet (550  mg total) by mouth 3 (three) times daily. 02/23/23   Danford, Earl Lites, MD  rivaroxaban (XARELTO) 20 MG TABS tablet Take 20 mg by mouth every evening.    [provider]  simvastatin (ZOCOR) 20 MG tablet Take 10 mg by mouth at bedtime.    [provider]      Allergies    Iodine, Ivp dye [iodinated contrast media], Nickel, Charentais melon (french melon), Cherry, Codeine, Other, Shellfish allergy, Sulfa antibiotics, and Tape    Review of Systems   Review of Systems  Physical Exam Updated Vital Signs BP (!)  150/65 (BP Location: Left Arm)   Pulse 85   Temp 97.7 F (36.5 C) (Oral)   Resp 15   Ht 5\' 2"  (1.575 m)   Wt 50 kg   SpO2 100%   BMI 20.16 kg/m  Physical Exam Vitals and nursing note reviewed.  Constitutional:      General: She is not in acute distress.    Appearance: She is well-developed. She is not ill-appearing.  HENT:     Head: Normocephalic and atraumatic.     Nose: Nose normal.     Mouth/Throat:     Mouth: Mucous membranes are moist.  Eyes:     Extraocular Movements: Extraocular movements intact.     Conjunctiva/sclera: Conjunctivae normal.     Pupils: Pupils are equal, round, and reactive to light.  Cardiovascular:     Rate and Rhythm: Normal rate and regular rhythm.     Pulses: Normal pulses.     Heart sounds: Normal heart sounds. No murmur heard. Pulmonary:     Effort: Pulmonary effort is normal. No respiratory distress.     Breath sounds: Normal breath sounds.  Abdominal:     General: Abdomen is flat.     Palpations: Abdomen is soft.     Tenderness: There is no abdominal tenderness.  Musculoskeletal:        General: No swelling.     Cervical back: Normal range of motion and neck supple.  Skin:    General: Skin is warm and dry.     Capillary Refill: Capillary refill takes less than 2 seconds.  Neurological:     Mental Status: She is alert.  Psychiatric:        Mood and Affect: Mood normal.     ED Results / Procedures / Treatments   Labs (all labs ordered are listed, but only abnormal results are displayed) Labs Reviewed  COMPREHENSIVE METABOLIC PANEL - Abnormal; Notable for the following components:      Result Value   CO2 20 (*)    All other components within normal limits  CBC - Abnormal; Notable for the following components:   WBC 3.5 (*)    RBC 3.85 (*)    Hemoglobin 11.0 (*)    HCT 34.8 (*)    All other components within normal limits  LIPASE, BLOOD    EKG None  Radiology No results found.  Procedures Procedures    Medications  Ordered in ED Medications  sodium chloride 0.9 % bolus 1,000 mL (1,000 mLs Intravenous New Bag/Given 02/26/23 1210)    ED Course/ Medical Decision Making/ A&P                                 Medical Decision Making Amount and/or Complexity of Data Reviewed Labs: ordered.   [place] Pelto is here with ongoing diarrhea.  Unremarkable  vitals.  No fever.  Patient mostly concern for dehydration.  She had extensive workup here at hospital stay here last week.  She has been treated for SIBO with Lomotil, Creon, Questran, rifaximin.  Increased diarrhea yesterday.  She is well-appearing otherwise.  No abdominal pain.  History of diabetes reflux A-fib on blood thinners.  Overall we will check electrolytes kidney function to evaluate for dehydration but clinically she looks well.  Does not have any abdominal tenderness.  No concern for intra-abdominal process at this time.  Patient given IV fluids feeling better.  Lab work shows no significant anemia electrolyte abnormality kidney injury or leukocytosis per my review interpretation.  Overall we had prolonged discussion about things and I think because she missed some doses of her medicines that might of made the diarrhea here worse.  I think that she can consider using Imodium over Lomotil for few days to see if that changes anything.  She has follow-up with her primary care and GI coming up.  But at this time I do think that this is a chronic process.  She has had extensive workup.  She does not have any electrolyte abnormality and do not think an admission would benefit her at this time.  She understands return precautions.  Discharged in good condition.  This chart was dictated using voice recognition software.  Despite best efforts to proofread,  errors can occur which can change the documentation meaning.         Final Clinical Impression(s) / ED Diagnoses Final diagnoses:  Diarrhea, unspecified type    Rx / DC Orders ED Discharge  Orders     None         Virgina Norfolk, DO 02/26/23 1319

## 2023-02-26 NOTE — ED Triage Notes (Signed)
Patient in ED by POV with c/o sever diarrhea. States she was D/C from facility last Thursday for same complaint. She reports possible dehydration and nausea. Denies vomiting, fever or chills.

## 2023-03-03 ENCOUNTER — Encounter (HOSPITAL_BASED_OUTPATIENT_CLINIC_OR_DEPARTMENT_OTHER): Payer: Self-pay | Admitting: Urology

## 2023-03-03 ENCOUNTER — Other Ambulatory Visit: Payer: Self-pay

## 2023-03-03 ENCOUNTER — Inpatient Hospital Stay (HOSPITAL_BASED_OUTPATIENT_CLINIC_OR_DEPARTMENT_OTHER)
Admission: EM | Admit: 2023-03-03 | Discharge: 2023-03-14 | DRG: 392 | Disposition: A | Discharge status: Home / Self Care | Payer: Medicare PPO | Attending: Family Medicine | Admitting: Family Medicine

## 2023-03-03 DIAGNOSIS — R197 Diarrhea, unspecified: Secondary | ICD-10-CM | POA: Diagnosis not present

## 2023-03-03 DIAGNOSIS — Z91018 Allergy to other foods: Secondary | ICD-10-CM

## 2023-03-03 DIAGNOSIS — Z885 Allergy status to narcotic agent status: Secondary | ICD-10-CM

## 2023-03-03 DIAGNOSIS — Z91041 Radiographic dye allergy status: Secondary | ICD-10-CM

## 2023-03-03 DIAGNOSIS — Z8489 Family history of other specified conditions: Secondary | ICD-10-CM

## 2023-03-03 DIAGNOSIS — E8721 Acute metabolic acidosis: Secondary | ICD-10-CM | POA: Diagnosis present

## 2023-03-03 DIAGNOSIS — D649 Anemia, unspecified: Secondary | ICD-10-CM | POA: Diagnosis present

## 2023-03-03 DIAGNOSIS — E860 Dehydration: Secondary | ICD-10-CM | POA: Diagnosis present

## 2023-03-03 DIAGNOSIS — K8689 Other specified diseases of pancreas: Secondary | ICD-10-CM | POA: Diagnosis present

## 2023-03-03 DIAGNOSIS — K219 Gastro-esophageal reflux disease without esophagitis: Secondary | ICD-10-CM | POA: Diagnosis present

## 2023-03-03 DIAGNOSIS — E10649 Type 1 diabetes mellitus with hypoglycemia without coma: Secondary | ICD-10-CM | POA: Diagnosis not present

## 2023-03-03 DIAGNOSIS — D509 Iron deficiency anemia, unspecified: Secondary | ICD-10-CM | POA: Diagnosis present

## 2023-03-03 DIAGNOSIS — Z825 Family history of asthma and other chronic lower respiratory diseases: Secondary | ICD-10-CM

## 2023-03-03 DIAGNOSIS — Z9641 Presence of insulin pump (external) (internal): Secondary | ICD-10-CM | POA: Diagnosis present

## 2023-03-03 DIAGNOSIS — K529 Noninfective gastroenteritis and colitis, unspecified: Principal | ICD-10-CM | POA: Diagnosis present

## 2023-03-03 DIAGNOSIS — Z7901 Long term (current) use of anticoagulants: Secondary | ICD-10-CM

## 2023-03-03 DIAGNOSIS — E7800 Pure hypercholesterolemia, unspecified: Secondary | ICD-10-CM | POA: Diagnosis present

## 2023-03-03 DIAGNOSIS — E876 Hypokalemia: Secondary | ICD-10-CM

## 2023-03-03 DIAGNOSIS — I10 Essential (primary) hypertension: Secondary | ICD-10-CM | POA: Diagnosis present

## 2023-03-03 DIAGNOSIS — Z794 Long term (current) use of insulin: Secondary | ICD-10-CM

## 2023-03-03 DIAGNOSIS — E109 Type 1 diabetes mellitus without complications: Secondary | ICD-10-CM | POA: Diagnosis present

## 2023-03-03 DIAGNOSIS — N179 Acute kidney failure, unspecified: Secondary | ICD-10-CM | POA: Diagnosis present

## 2023-03-03 DIAGNOSIS — R634 Abnormal weight loss: Secondary | ICD-10-CM

## 2023-03-03 DIAGNOSIS — Z91048 Other nonmedicinal substance allergy status: Secondary | ICD-10-CM

## 2023-03-03 DIAGNOSIS — Z79899 Other long term (current) drug therapy: Secondary | ICD-10-CM

## 2023-03-03 DIAGNOSIS — Z91013 Allergy to seafood: Secondary | ICD-10-CM

## 2023-03-03 DIAGNOSIS — Z882 Allergy status to sulfonamides status: Secondary | ICD-10-CM

## 2023-03-03 DIAGNOSIS — E785 Hyperlipidemia, unspecified: Secondary | ICD-10-CM | POA: Insufficient documentation

## 2023-03-03 DIAGNOSIS — I480 Paroxysmal atrial fibrillation: Secondary | ICD-10-CM | POA: Insufficient documentation

## 2023-03-03 DIAGNOSIS — E16A3 Hypoglycemia level 3: Secondary | ICD-10-CM | POA: Diagnosis not present

## 2023-03-03 LAB — CBC
HCT: 37.6 % (ref 36.0–46.0)
Hemoglobin: 12.1 g/dL (ref 12.0–15.0)
MCH: 27.9 pg (ref 26.0–34.0)
MCHC: 32.2 g/dL (ref 30.0–36.0)
MCV: 86.6 fL (ref 80.0–100.0)
Platelets: 356 10*3/uL (ref 150–400)
RBC: 4.34 MIL/uL (ref 3.87–5.11)
RDW: 14.6 % (ref 11.5–15.5)
WBC: 5.9 10*3/uL (ref 4.0–10.5)
nRBC: 0 % (ref 0.0–0.2)

## 2023-03-03 LAB — I-STAT VENOUS BLOOD GAS, ED
Acid-base deficit: 15 mmol/L — ABNORMAL HIGH (ref 0.0–2.0)
Bicarbonate: 11.9 mmol/L — ABNORMAL LOW (ref 20.0–28.0)
Calcium, Ion: 1.33 mmol/L (ref 1.15–1.40)
HCT: 30 % — ABNORMAL LOW (ref 36.0–46.0)
Hemoglobin: 10.2 g/dL — ABNORMAL LOW (ref 12.0–15.0)
O2 Saturation: 63 %
Patient temperature: 98.6
Potassium: 3.9 mmol/L (ref 3.5–5.1)
Sodium: 140 mmol/L (ref 135–145)
TCO2: 13 mmol/L — ABNORMAL LOW (ref 22–32)
pCO2, Ven: 30.8 mm[Hg] — ABNORMAL LOW (ref 44–60)
pH, Ven: 7.195 — CL (ref 7.25–7.43)
pO2, Ven: 39 mm[Hg] (ref 32–45)

## 2023-03-03 LAB — URINALYSIS, ROUTINE W REFLEX MICROSCOPIC
Glucose, UA: NEGATIVE mg/dL
Hgb urine dipstick: NEGATIVE
Ketones, ur: 15 mg/dL — AB
Leukocytes,Ua: NEGATIVE
Nitrite: NEGATIVE
Protein, ur: 100 mg/dL — AB
Specific Gravity, Urine: >=1.03 (ref 1.005–1.030)
pH: 5.5 (ref 5.0–8.0)

## 2023-03-03 LAB — COMPREHENSIVE METABOLIC PANEL
ALT: 22 U/L (ref 0–44)
AST: 30 U/L (ref 15–41)
Albumin: 3.4 g/dL — ABNORMAL LOW (ref 3.5–5.0)
Alkaline Phosphatase: 53 U/L (ref 38–126)
Anion gap: 13 (ref 5–15)
BUN: 24 mg/dL — ABNORMAL HIGH (ref 8–23)
CO2: 11 mmol/L — ABNORMAL LOW (ref 22–32)
Calcium: 9.1 mg/dL (ref 8.9–10.3)
Chloride: 111 mmol/L (ref 98–111)
Creatinine, Ser: 1.64 mg/dL — ABNORMAL HIGH (ref 0.44–1.00)
GFR, Estimated: 31 mL/min — ABNORMAL LOW (ref >60)
Glucose, Bld: 161 mg/dL — ABNORMAL HIGH (ref 70–99)
Potassium: 4.1 mmol/L (ref 3.5–5.1)
Sodium: 135 mmol/L (ref 135–145)
Total Bilirubin: 0.6 mg/dL (ref 0.0–1.2)
Total Protein: 6.1 g/dL — ABNORMAL LOW (ref 6.5–8.1)

## 2023-03-03 LAB — URINALYSIS, MICROSCOPIC (REFLEX)

## 2023-03-03 LAB — LIPASE, BLOOD: Lipase: 21 U/L (ref 11–51)

## 2023-03-03 MED ORDER — STERILE WATER FOR INJECTION IV SOLN: INTRAVENOUS | Status: DC

## 2023-03-03 MED ORDER — SODIUM BICARBONATE 8.4 % IV SOLN
INTRAVENOUS | Status: AC
  Filled 2023-03-03: qty 150

## 2023-03-03 MED ORDER — SODIUM BICARBONATE 8.4 % IV SOLN
INTRAVENOUS | Status: DC
  Filled 2023-03-03 (×3): qty 1000
  Filled 2023-03-03 (×2): qty 150
  Filled 2023-03-03: qty 1000

## 2023-03-03 MED ORDER — SODIUM CHLORIDE 0.9 % IV BOLUS
1000.0000 mL | Freq: Once | INTRAVENOUS | Status: AC
  Administered 2023-03-03: 1000 mL via INTRAVENOUS

## 2023-03-03 NOTE — ED Triage Notes (Signed)
Pt here from continued nausea, no vomiting  States nausea since November and diarrhea   Reports weakness      Recent admission to [facility]

## 2023-03-03 NOTE — Plan of Care (Signed)
Plan of Care Note for accepted transfer  Patient: Donna Wiley              NWG:[id number]  DOA: 03/03/2023     Facility requesting transfer: [facility] emergency department Requesting Provider: Elpidio Anis, PA-C    Reason for transfer: Acute on chronic diarrhea, metabolic acidosis secondary to chronic diarrhea and prerenal AKI  Facility course: 83 year old female history of DM type I on insulin pump, atrial fibrillation, chronic iron deficiency anemia and previous hospital admission for diarrhea presented to emergency department for ongoing nausea and diarrhea.  Reported generalized weakness.  Per ED physician note reviewed, patient returns to ED with persistent diarrhea which started just after Thanksgiving and has resulted in multiple ED visits and hospital admissions since. Last discharge was 1/23 after extensive work up, including endoscopies with biopsies that have not been reviewed with GI, per patient (due Monday). She reports that for the last 2-3 days, she has had progressive weakness, sometimes having to lie down on the floor until able to get up. No falls. She has nausea without vomiting but that makes it hard to eat or take her medications. She reports she is drinking fluids. She has a history of Type 1 DM and has been managing her blood sugars via glucose monitoring and her pump. She denies blood in her bowel movements. She stopped counting 2 days ago but reports that 2 days ago she had 30 BM's from noon until 6:00 pm.   Per chart review from the previous discharge summary from 02/23/2023: (((The patient was hospitalized last December for similar symptoms, underwent colonoscopy which showed normal terminal ileum, normal colon, and biopsies negative for microscopic colitis or inflammatory bowel disease.  She was discharged on cholestyramine and Lomotil, and symptoms were initially somewhat better, but then in the last 1 to 2 weeks, she was having severe diarrhea again  (up to 13-15 bowel movements per day).   She returned to the hospital, GI were consulted again, and she was noted to have low fecal elastase.  She underwent capsule study and EGD, both of which were fairly unremarkable.  She was started on rifaximin for empiric treatment of SIBO, Creon due to low fecal elastase (for empiric treatment of pancreatic insufficiency).  Her Lomotil and cholestyramine were continued.    At the time of discharge, the patient's bowel movements had reduced to 3 to 4/day, still quite loose and at times with urgency and accidents.  She was discharged to follow-up with her home GI, Dr. Lanae Boast soon as possible )))    At presentation to ED patient is tachycardic heart rate 138 and hypotensive blood pressure 92/50.  Heart rate has been improved to 38 and blood pressure has been improved to 114/57.  CMP showing low bicarb 11, elevated creatinine 1.64, blood glucose 161. CBC unremarkable. GI and C. difficile panel is pending.  Given patient has significant metabolic acidosis starting bicarb drip.  Hospitalist has been consulted for further evaluation and management of ongoing diarrhea, metabolic acidosis and acute kidney injury.    Plan of care: The patient is accepted for admission for inpatient status to Progressive unit, at [facility].  Check [email or web address] for on-call coverage.  [facility] will assume care on arrival to accepting facility. Until arrival, medical decision making responsibilities remain with the EDP.  However, [facility] available 24/7 for questions and assistance.   Nursing staff please page [facility] Admits and Consults [phone]) as soon as the patient arrives to  the hospital.    Author: Tereasa Coop, MD  03/03/2023  [facility]

## 2023-03-03 NOTE — ED Provider Notes (Signed)
[facility] Provider Note   CSN: [id number] Arrival date & time: 03/03/23  1544     History  Chief Complaint  Patient presents with   Diarrhea    Donna Wiley is a 83 y.o. female.  Patient returns to ED with persistent diarrhea which started just after Thanksgiving and has resulted in multiple ED visits and hospital admissions since. Last discharge was 1/23 after extensive work up, including endoscopies with biopsies that have not been reviewed with GI, per patient (due Monday). She reports that for the last 2-3 days, she has had progressive weakness, sometimes having to lie down on the floor until able to get up. No falls. She has nausea without vomiting but that makes it hard to eat or take her medications. She reports she is drinking fluids. She has a history of Type 1 DM and has been managing her blood sugars via glucose monitoring and her pump. She denies blood in her bowel movements. She stopped counting 2 days ago but reports that 2 days ago she had 30 BM's from noon until 6:00 pm.   The history is provided by the patient. No language interpreter was used.  Diarrhea      Home Medications Prior to Admission medications   Medication Sig Start Date End Date Taking? Authorizing Provider  acetaminophen (TYLENOL) 500 MG tablet Take 1,000 mg by mouth every 6 (six) hours as needed for mild pain (pain score 1-3) or moderate pain (pain score 4-6).    [provider]  b complex vitamins capsule Take 1 capsule by mouth every evening.    [provider]  calcium citrate (CALCITRATE - DOSED IN MG ELEMENTAL CALCIUM) 950 (200 Ca) MG tablet Take 500 mg of elemental calcium by mouth 2 (two) times daily.    [provider]  cholestyramine (QUESTRAN) 4 g packet Take 1 packet (4 g total) by mouth 2 (two) times daily for 14 days. 02/24/23 03/10/23  Alberteen Sam, MD  clobetasol cream (TEMOVATE) 0.05 % Apply 1  Application topically 2 (two) times daily as needed (Dermatitis). 09/15/21   [provider]  diltiazem (CARDIZEM CD) 180 MG 24 hr capsule Take 180 mg by mouth at bedtime.    [provider]  diphenoxylate-atropine (LOMOTIL) 2.5-0.025 MG tablet Take 1 tablet by mouth 4 (four) times daily. 02/23/23   Danford, Earl Lites, MD  EPINEPHrine 0.3 mg/0.3 mL IJ SOAJ injection Inject 0.3 mg into the muscle as needed for anaphylaxis. 06/21/21   [provider]  ferrous sulfate 325 (65 FE) MG tablet Take 1 tablet (325 mg total) by mouth every other day. 02/24/23   Danford, Earl Lites, MD  flecainide (TAMBOCOR) 50 MG tablet Take 50-100 mg by mouth See admin instructions. Take 50mg  (1 tablet) by mouth every morning and 100mg  (2 tablets) every evening.    [provider]  insulin lispro (HUMALOG) 100 UNIT/ML injection Inject 0-15 Units into the skin 4 (four) times daily. Per pump    [provider]  lipase/protease/amylase (CREON) 36000 UNITS CPEP capsule Take 2 capsules (72,000 Units total) by mouth 3 (three) times daily with meals. 02/23/23   Danford, Earl Lites, MD  LORazepam (ATIVAN) 0.5 MG tablet Take 0.5 mg by mouth at bedtime. 12/10/22   [provider]  mirabegron ER (MYRBETRIQ) 50 MG TB24 tablet Take 50 mg by mouth daily. 07/13/21 02/14/23  [provider]  olmesartan-hydrochlorothiazide (BENICAR HCT) 40-12.5 MG tablet Take 1 tablet by  mouth in the morning. 02/17/21   [provider]  ondansetron (ZOFRAN) 4 MG tablet Take 1 tablet (4 mg total) by mouth every 6 (six) hours. Patient taking differently: Take 4 mg by mouth every 6 (six) hours as needed for nausea or vomiting. 02/04/23   Arletha Pili, DO  ondansetron (ZOFRAN-ODT) 4 MG disintegrating tablet Take 1 tablet (4 mg total) by mouth every 8 (eight) hours as needed for nausea or vomiting. 02/26/23   Curatolo, Adam, DO  pantoprazole (PROTONIX) 40 MG tablet Take 40 mg by mouth  daily. Patient not taking: Reported on 02/14/2023    [provider]  rifaximin (XIFAXAN) 550 MG TABS tablet Take 1 tablet (550 mg total) by mouth 3 (three) times daily. 02/23/23   Danford, Earl Lites, MD  rivaroxaban (XARELTO) 20 MG TABS tablet Take 20 mg by mouth every evening.    [provider]  simvastatin (ZOCOR) 20 MG tablet Take 10 mg by mouth at bedtime.    [provider]      Allergies    Iodine, Ivp dye [iodinated contrast media], Nickel, Charentais melon (french melon), Cherry, Codeine, Other, Shellfish allergy, Sulfa antibiotics, and Tape    Review of Systems   Review of Systems  Gastrointestinal:  Positive for diarrhea.    Physical Exam Updated Vital Signs BP (!) 114/57 (BP Location: Left Arm)   Pulse 98   Temp 98 F (36.7 C)   Resp 18   Ht 5\' 2"  (1.575 m)   Wt 50 kg   SpO2 100%   BMI 20.16 kg/m  Physical Exam Vitals and nursing note reviewed.  Constitutional:      Appearance: Normal appearance. She is well-developed. She is not toxic-appearing.  HENT:     Head: Normocephalic.     Mouth/Throat:     Mouth: Mucous membranes are moist.  Cardiovascular:     Rate and Rhythm: Normal rate and regular rhythm.     Heart sounds: No murmur heard. Pulmonary:     Effort: Pulmonary effort is normal.     Breath sounds: Normal breath sounds. No wheezing, rhonchi or rales.  Abdominal:     General: Bowel sounds are normal. There is no distension.     Palpations: Abdomen is soft.     Tenderness: There is abdominal tenderness (Diffuse, mild). There is no guarding or rebound.  Musculoskeletal:        General: Normal range of motion.     Cervical back: Normal range of motion and neck supple.  Skin:    General: Skin is warm and dry.  Neurological:     General: No focal deficit present.     Mental Status: She is alert and oriented to person, place, and time.     ED Results / Procedures / Treatments   Labs (all labs ordered are listed, but  only abnormal results are displayed) Labs Reviewed  COMPREHENSIVE METABOLIC PANEL - Abnormal; Notable for the following components:      Result Value   CO2 11 (*)    Glucose, Bld 161 (*)    BUN 24 (*)    Creatinine, Ser 1.64 (*)    Total Protein 6.1 (*)    Albumin 3.4 (*)    GFR, Estimated 31 (*)    All other components within normal limits  URINALYSIS, ROUTINE W REFLEX MICROSCOPIC - Abnormal; Notable for the following components:   APPearance HAZY (*)    Bilirubin Urine MODERATE (*)    Ketones, ur 15 (*)  Protein, ur 100 (*)    All other components within normal limits  URINALYSIS, MICROSCOPIC (REFLEX) - Abnormal; Notable for the following components:   Bacteria, UA FEW (*)    All other components within normal limits  GASTROINTESTINAL PANEL BY PCR, STOOL (REPLACES STOOL CULTURE)  C DIFFICILE QUICK SCREEN W PCR REFLEX    LIPASE, BLOOD  CBC  BLOOD GAS, VENOUS    EKG None  Radiology No results found.  Procedures Procedures    Medications Ordered in ED Medications  sodium bicarbonate 150 mEq in dextrose 5 % 1,150 mL infusion (has no administration in time range)  sodium chloride 0.9 % bolus 1,000 mL (0 mLs Intravenous Stopped 03/03/23 2054)    ED Course/ Medical Decision Making/ A&P                                 Medical Decision Making Patient to ED with persistent diarrhea since Thanksgiving. Last hospitalization 1/13 - 1/23. She has had extensive work up without improvement. Today she became profoundly weak, having to lie down on the floor. Nausea without vomiting. No fever. No bloody stools.   Labs today showing a markedly decreased CO2 of 11, acute AKI with Cr. 1.64 (0.88 on 1/26); WBC normal at 5.9, hgb normal at 12.1. Urine with moderate bilirubin, Sp Gr >1.030, +protein.   IV fluids started for aggressive hydration. Hospitalist contacted for re-admission. Discussed with Dr. Janalyn Shy who accepts for admission.   Amount and/or Complexity of Data  Reviewed Labs: ordered.  Risk Decision regarding hospitalization.           Final Clinical Impression(s) / ED Diagnoses Final diagnoses:  Diarrhea, unspecified type  AKI (acute kidney injury) [facility])    Rx / DC Orders ED Discharge Orders     None         Danne Harbor 03/03/23 2226    Jacalyn Lefevre, MD 03/03/23 2351

## 2023-03-03 NOTE — ED Notes (Signed)
Lab called insufficient amount of urine. Need to recollect

## 2023-03-04 DIAGNOSIS — K529 Noninfective gastroenteritis and colitis, unspecified: Secondary | ICD-10-CM | POA: Diagnosis present

## 2023-03-04 DIAGNOSIS — I10 Essential (primary) hypertension: Secondary | ICD-10-CM | POA: Diagnosis present

## 2023-03-04 DIAGNOSIS — E860 Dehydration: Secondary | ICD-10-CM | POA: Diagnosis present

## 2023-03-04 DIAGNOSIS — E10649 Type 1 diabetes mellitus with hypoglycemia without coma: Secondary | ICD-10-CM | POA: Diagnosis not present

## 2023-03-04 DIAGNOSIS — E16A3 Hypoglycemia level 3: Secondary | ICD-10-CM | POA: Diagnosis not present

## 2023-03-04 DIAGNOSIS — E7800 Pure hypercholesterolemia, unspecified: Secondary | ICD-10-CM | POA: Diagnosis present

## 2023-03-04 DIAGNOSIS — E876 Hypokalemia: Secondary | ICD-10-CM | POA: Diagnosis present

## 2023-03-04 DIAGNOSIS — D509 Iron deficiency anemia, unspecified: Secondary | ICD-10-CM | POA: Diagnosis present

## 2023-03-04 DIAGNOSIS — Z9641 Presence of insulin pump (external) (internal): Secondary | ICD-10-CM | POA: Diagnosis present

## 2023-03-04 DIAGNOSIS — Z91013 Allergy to seafood: Secondary | ICD-10-CM | POA: Diagnosis not present

## 2023-03-04 DIAGNOSIS — R197 Diarrhea, unspecified: Secondary | ICD-10-CM | POA: Diagnosis present

## 2023-03-04 DIAGNOSIS — Z79899 Other long term (current) drug therapy: Secondary | ICD-10-CM | POA: Diagnosis not present

## 2023-03-04 DIAGNOSIS — Z882 Allergy status to sulfonamides status: Secondary | ICD-10-CM | POA: Diagnosis not present

## 2023-03-04 DIAGNOSIS — Z91041 Radiographic dye allergy status: Secondary | ICD-10-CM | POA: Diagnosis not present

## 2023-03-04 DIAGNOSIS — N179 Acute kidney failure, unspecified: Secondary | ICD-10-CM | POA: Diagnosis present

## 2023-03-04 DIAGNOSIS — R634 Abnormal weight loss: Secondary | ICD-10-CM

## 2023-03-04 DIAGNOSIS — K8689 Other specified diseases of pancreas: Secondary | ICD-10-CM | POA: Diagnosis present

## 2023-03-04 DIAGNOSIS — Z794 Long term (current) use of insulin: Secondary | ICD-10-CM | POA: Diagnosis not present

## 2023-03-04 DIAGNOSIS — Z91048 Other nonmedicinal substance allergy status: Secondary | ICD-10-CM | POA: Diagnosis not present

## 2023-03-04 DIAGNOSIS — Z885 Allergy status to narcotic agent status: Secondary | ICD-10-CM | POA: Diagnosis not present

## 2023-03-04 DIAGNOSIS — Z7901 Long term (current) use of anticoagulants: Secondary | ICD-10-CM | POA: Diagnosis not present

## 2023-03-04 DIAGNOSIS — Z91018 Allergy to other foods: Secondary | ICD-10-CM | POA: Diagnosis not present

## 2023-03-04 DIAGNOSIS — I480 Paroxysmal atrial fibrillation: Secondary | ICD-10-CM | POA: Diagnosis present

## 2023-03-04 DIAGNOSIS — E8721 Acute metabolic acidosis: Secondary | ICD-10-CM | POA: Diagnosis present

## 2023-03-04 LAB — BASIC METABOLIC PANEL
Anion gap: 7 (ref 5–15)
BUN: 21 mg/dL (ref 8–23)
CO2: 21 mmol/L — ABNORMAL LOW (ref 22–32)
Calcium: 8.3 mg/dL — ABNORMAL LOW (ref 8.9–10.3)
Chloride: 109 mmol/L (ref 98–111)
Creatinine, Ser: 0.9 mg/dL (ref 0.44–1.00)
GFR, Estimated: >60 mL/min (ref >60)
Glucose, Bld: 209 mg/dL — ABNORMAL HIGH (ref 70–99)
Potassium: 3.2 mmol/L — ABNORMAL LOW (ref 3.5–5.1)
Sodium: 137 mmol/L (ref 135–145)

## 2023-03-04 LAB — C DIFFICILE QUICK SCREEN W PCR REFLEX
C Diff antigen: NEGATIVE
C Diff interpretation: NOT DETECTED
C Diff toxin: NEGATIVE

## 2023-03-04 LAB — GLUCOSE, CAPILLARY
Glucose-Capillary: 176 mg/dL — ABNORMAL HIGH (ref 70–99)
Glucose-Capillary: 51 mg/dL — ABNORMAL LOW (ref 70–99)
Glucose-Capillary: 46 mg/dL — ABNORMAL LOW (ref 70–99)
Glucose-Capillary: 52 mg/dL — ABNORMAL LOW (ref 70–99)
Glucose-Capillary: 133 mg/dL — ABNORMAL HIGH (ref 70–99)

## 2023-03-04 MED ORDER — ACETAMINOPHEN 650 MG RE SUPP: 650.0000 mg | Freq: Four times a day (QID) | RECTAL | Status: DC | PRN

## 2023-03-04 MED ORDER — LORAZEPAM 0.5 MG PO TABS
0.5000 mg | ORAL_TABLET | Freq: Every day | ORAL | Status: DC
  Administered 2023-03-04 – 2023-03-13 (×10): 0.5 mg via ORAL
  Filled 2023-03-04 (×10): qty 1

## 2023-03-04 MED ORDER — DIPHENOXYLATE-ATROPINE 2.5-0.025 MG PO TABS: 1.0000 | ORAL_TABLET | Freq: Four times a day (QID) | ORAL | Status: DC

## 2023-03-04 MED ORDER — CHOLESTYRAMINE 4 G PO PACK: 4.0000 g | PACK | Freq: Two times a day (BID) | ORAL | Status: DC

## 2023-03-04 MED ORDER — RIVAROXABAN 15 MG PO TABS
15.0000 mg | ORAL_TABLET | Freq: Every day | ORAL | Status: DC
  Administered 2023-03-04 – 2023-03-13 (×10): 15 mg via ORAL
  Filled 2023-03-04 (×10): qty 1

## 2023-03-04 MED ORDER — TRAZODONE HCL 50 MG PO TABS
25.0000 mg | ORAL_TABLET | Freq: Every evening | ORAL | Status: DC | PRN
  Filled 2023-03-04: qty 1

## 2023-03-04 MED ORDER — FLECAINIDE ACETATE 50 MG PO TABS
50.0000 mg | ORAL_TABLET | Freq: Every day | ORAL | Status: DC
Start: 2023-03-04 — End: 2023-03-14
  Administered 2023-03-04 – 2023-03-14 (×11): 50 mg via ORAL
  Filled 2023-03-04 (×12): qty 1

## 2023-03-04 MED ORDER — CHOLESTYRAMINE LIGHT 4 G PO PACK
4.0000 g | PACK | Freq: Two times a day (BID) | ORAL | Status: DC
  Administered 2023-03-04 – 2023-03-09 (×10): 4 g via ORAL
  Filled 2023-03-04 (×11): qty 1

## 2023-03-04 MED ORDER — RIFAXIMIN 550 MG PO TABS: 550.0000 mg | ORAL_TABLET | Freq: Three times a day (TID) | ORAL | Status: DC

## 2023-03-04 MED ORDER — ONDANSETRON HCL 4 MG PO TABS: 4.0000 mg | ORAL_TABLET | Freq: Four times a day (QID) | ORAL | Status: DC | PRN

## 2023-03-04 MED ORDER — DEXTROSE 50 % IV SOLN
INTRAVENOUS | Status: AC
  Administered 2023-03-04: 25 mL
  Filled 2023-03-04: qty 50

## 2023-03-04 MED ORDER — INSULIN PUMP
Freq: Three times a day (TID) | SUBCUTANEOUS | Status: DC
  Filled 2023-03-04: qty 1

## 2023-03-04 MED ORDER — FLECAINIDE ACETATE 100 MG PO TABS
100.0000 mg | ORAL_TABLET | Freq: Every day | ORAL | Status: DC
  Administered 2023-03-04 – 2023-03-13 (×10): 100 mg via ORAL
  Filled 2023-03-04 (×12): qty 1

## 2023-03-04 MED ORDER — ACETAMINOPHEN 325 MG PO TABS
650.0000 mg | ORAL_TABLET | Freq: Four times a day (QID) | ORAL | Status: DC | PRN
  Filled 2023-03-04: qty 2

## 2023-03-04 MED ORDER — FLECAINIDE ACETATE 50 MG PO TABS: 50.0000 mg | ORAL_TABLET | ORAL | Status: DC

## 2023-03-04 MED ORDER — METRONIDAZOLE 500 MG PO TABS
250.0000 mg | ORAL_TABLET | Freq: Four times a day (QID) | ORAL | Status: DC
  Administered 2023-03-04 – 2023-03-10 (×22): 250 mg via ORAL
  Filled 2023-03-04 (×22): qty 1

## 2023-03-04 MED ORDER — PANCRELIPASE (LIP-PROT-AMYL) 36000-114000 UNITS PO CPEP
72000.0000 [IU] | ORAL_CAPSULE | Freq: Three times a day (TID) | ORAL | Status: DC
  Administered 2023-03-04 – 2023-03-06 (×5): 72000 [IU] via ORAL
  Filled 2023-03-04 (×5): qty 2

## 2023-03-04 MED ORDER — ONDANSETRON HCL 4 MG/2ML IJ SOLN: 4.0000 mg | Freq: Four times a day (QID) | INTRAMUSCULAR | Status: DC | PRN

## 2023-03-04 MED ORDER — DILTIAZEM HCL ER COATED BEADS 180 MG PO CP24: 180.0000 mg | ORAL_CAPSULE | Freq: Every day | ORAL | Status: DC

## 2023-03-04 MED ORDER — DILTIAZEM HCL ER COATED BEADS 180 MG PO CP24
180.0000 mg | ORAL_CAPSULE | Freq: Every day | ORAL | Status: DC
  Administered 2023-03-04 – 2023-03-13 (×10): 180 mg via ORAL
  Filled 2023-03-04 (×10): qty 1

## 2023-03-04 MED ORDER — DIPHENOXYLATE-ATROPINE 2.5-0.025 MG PO TABS
2.0000 | ORAL_TABLET | Freq: Three times a day (TID) | ORAL | Status: DC
  Administered 2023-03-04 – 2023-03-07 (×9): 2 via ORAL
  Filled 2023-03-04 (×10): qty 2

## 2023-03-04 MED ORDER — SIMVASTATIN 20 MG PO TABS
10.0000 mg | ORAL_TABLET | Freq: Every day | ORAL | Status: DC
Start: 2023-03-04 — End: 2023-03-14
  Administered 2023-03-04 – 2023-03-13 (×10): 10 mg via ORAL
  Filled 2023-03-04 (×10): qty 1

## 2023-03-04 MED ORDER — ALBUTEROL SULFATE (2.5 MG/3ML) 0.083% IN NEBU: 2.5000 mg | INHALATION_SOLUTION | RESPIRATORY_TRACT | Status: DC | PRN

## 2023-03-04 MED ORDER — RIVAROXABAN 20 MG PO TABS
20.0000 mg | ORAL_TABLET | Freq: Every evening | ORAL | Status: DC
Start: 2023-03-04 — End: 2023-03-04

## 2023-03-04 NOTE — H&P (Signed)
History and Physical  Tersea Aulds WUJ:[id number] DOB: 11/30/1940 DOA: 03/03/2023  PCP: Raquel James, MD   Chief Complaint: diarrhea   HPI: Donna Wiley is a 83 y.o. female with medical history significant for type 1 diabetes on insulin pump, paroxysmal atrial fibrillation on Xarelto and iron deficiency anemia being admitted to the hospital with persistent chronic diarrhea.  States she has been having persistent watery diarrhea up to 30 times per day since November, she has had a couple of hospitalizations for this reason, she has had negative workup including upper endoscopy, colonoscopy with biopsies (has not yet followed up with GI to discuss biopsy results), capsule endoscopy was discharged on cholestyramine, Lomotil, Xifaxan for possible SIBO, and Creon due to low fecal elastase.  During her last hospitalization, at the time of discharge on 1/24 her bowel movements had slowed to about 3-4 per day.  Tells me that after she got home, she did continue her medications as prescribed, unfortunately her diarrhea recurred once again up to about 30/day although she stopped counting a couple of days ago.  She denies any significant nausea, no vomiting, no significant abdominal pain, fevers, chills, blood in her stool.  Review of Systems: Please see HPI for pertinent positives and negatives. A complete 10 system review of systems are otherwise negative.  Past Medical History:  Diagnosis Date   Arthritis    Asthma    childhood   Atrial fibrillation (HCC)    Cystocele    Diabetes mellitus    GERD (gastroesophageal reflux disease)    High cholesterol    Kidney stone    Rectocele    SVT (supraventricular tachycardia) (HCC)    Past Surgical History:  Procedure Laterality Date   ABDOMINAL HYSTERECTOMY     APPENDECTOMY     BIOPSY  01/13/2023   Procedure: BIOPSY;  Surgeon: Sherrilyn Rist, MD;  Location: [facility] ENDOSCOPY;  Service: Gastroenterology;;   BIOPSY  02/22/2023   Procedure:  BIOPSY;  Surgeon: Shellia Cleverly, DO;  Location: [facility] ENDOSCOPY;  Service: Gastroenterology;;   CATARACT EXTRACTION     CHOLECYSTECTOMY     COLONOSCOPY WITH PROPOFOL N/A 01/13/2023   Procedure: COLONOSCOPY WITH PROPOFOL;  Surgeon: Sherrilyn Rist, MD;  Location[facility] ENDOSCOPY;  Service: Gastroenterology;  Laterality: N/A;   ESOPHAGOGASTRODUODENOSCOPY Left 02/22/2023   Procedure: ESOPHAGOGASTRODUODENOSCOPY (EGD);  Surgeon: Shellia Cleverly, DO;  Location: [facility] ENDOSCOPY;  Service: Gastroenterology;  Laterality: Left;   FOOT SURGERY     GIVENS CAPSULE STUDY N/A 02/20/2023   Procedure: GIVENS CAPSULE STUDY;  Surgeon: Shellia Cleverly, DO;  Location: [facility] ENDOSCOPY;  Service: Gastroenterology;  Laterality: N/A;   HAND SURGERY     ROTATOR CUFF REPAIR     Social History:  reports that she has never smoked. She has never been exposed to tobacco smoke. She has never used smokeless tobacco. She reports that she does not drink alcohol and does not use drugs.  Allergies  Allergen Reactions   Iodine Anaphylaxis   Ivp Dye [Iodinated Contrast Media] Anaphylaxis   Nickel Rash   Charentais Melon (French Melon) Swelling   Cherry Swelling   Codeine Other (See Comments)    Hallucinations    Other Swelling    Tree nuts    Shellfish Allergy Swelling   Sulfa Antibiotics Swelling   Tape Dermatitis    Paper tape is best PAPER TAPE     Family History  Problem Relation Age of Onset   Allergic rhinitis Daughter  Asthma Daughter    Lupus Daughter    Scoliosis Daughter    Breast cancer Neg Hx    Urticaria Neg Hx    Immunodeficiency Neg Hx    Angioedema Neg Hx    Atopy Neg Hx    Eczema Neg Hx      Prior to Admission medications   Medication Sig Start Date End Date Taking? Authorizing Provider  acetaminophen (TYLENOL) 500 MG tablet Take 1,000 mg by mouth every 6 (six) hours as needed for mild pain (pain score 1-3) or moderate pain (pain score 4-6).    [provider]  b complex  vitamins capsule Take 1 capsule by mouth every evening.    [provider]  calcium citrate (CALCITRATE - DOSED IN MG ELEMENTAL CALCIUM) 950 (200 Ca) MG tablet Take 500 mg of elemental calcium by mouth 2 (two) times daily.    [provider]  cholestyramine (QUESTRAN) 4 g packet Take 1 packet (4 g total) by mouth 2 (two) times daily for 14 days. 02/24/23 03/10/23  Alberteen Sam, MD  clobetasol cream (TEMOVATE) 0.05 % Apply 1 Application topically 2 (two) times daily as needed (Dermatitis). 09/15/21   [provider]  diltiazem (CARDIZEM CD) 180 MG 24 hr capsule Take 180 mg by mouth at bedtime.    [provider]  diphenoxylate-atropine (LOMOTIL) 2.5-0.025 MG tablet Take 1 tablet by mouth 4 (four) times daily. 02/23/23   Danford, Earl Lites, MD  EPINEPHrine 0.3 mg/0.3 mL IJ SOAJ injection Inject 0.3 mg into the muscle as needed for anaphylaxis. 06/21/21   [provider]  ferrous sulfate 325 (65 FE) MG tablet Take 1 tablet (325 mg total) by mouth every other day. 02/24/23   Danford, Earl Lites, MD  flecainide (TAMBOCOR) 50 MG tablet Take 50-100 mg by mouth See admin instructions. Take 50mg  (1 tablet) by mouth every morning and 100mg  (2 tablets) every evening.    [provider]  insulin lispro (HUMALOG) 100 UNIT/ML injection Inject 0-15 Units into the skin 4 (four) times daily. Per pump    [provider]  lipase/protease/amylase (CREON) 36000 UNITS CPEP capsule Take 2 capsules (72,000 Units total) by mouth 3 (three) times daily with meals. 02/23/23   Danford, Earl Lites, MD  LORazepam (ATIVAN) 0.5 MG tablet Take 0.5 mg by mouth at bedtime. 12/10/22   [provider]  mirabegron ER (MYRBETRIQ) 50 MG TB24 tablet Take 50 mg by mouth daily. 07/13/21 02/14/23  [provider]  olmesartan-hydrochlorothiazide (BENICAR HCT) 40-12.5 MG tablet Take 1 tablet by mouth in the morning. 02/17/21   [provider]   ondansetron (ZOFRAN) 4 MG tablet Take 1 tablet (4 mg total) by mouth every 6 (six) hours. Patient taking differently: Take 4 mg by mouth every 6 (six) hours as needed for nausea or vomiting. 02/04/23   Arletha Pili, DO  ondansetron (ZOFRAN-ODT) 4 MG disintegrating tablet Take 1 tablet (4 mg total) by mouth every 8 (eight) hours as needed for nausea or vomiting. 02/26/23   Curatolo, Adam, DO  pantoprazole (PROTONIX) 40 MG tablet Take 40 mg by mouth daily. Patient not taking: Reported on 02/14/2023    [provider]  rifaximin (XIFAXAN) 550 MG TABS tablet Take 1 tablet (550 mg total) by mouth 3 (three) times daily. 02/23/23   Danford, Earl Lites, MD  rivaroxaban (XARELTO) 20 MG TABS tablet Take 20 mg by mouth every evening.    [provider]  simvastatin (ZOCOR) 20 MG tablet Take  10 mg by mouth at bedtime.    [provider]    Physical Exam: BP (!) 151/66 (BP Location: Left Arm)   Pulse (!) 102   Temp 98.8 F (37.1 C) (Oral)   Resp 14   Ht 5\' 2"  (1.575 m)   Wt 50 kg   SpO2 100%   BMI 20.16 kg/m  General:  Alert, oriented, calm, in no acute distress, looks comfortable and euvolemic on examination Cardiovascular: RRR, no murmurs or rubs, no peripheral edema  Respiratory: clear to auscultation bilaterally, no wheezes, no crackles  Abdomen: soft, nontender, nondistended, normal bowel tones heard  Skin: dry, no rashes  Musculoskeletal: no joint effusions, normal range of motion  Psychiatric: appropriate affect, normal speech  Neurologic: extraocular muscles intact, clear speech, moving all extremities with intact sensorium         Labs on Admission:  Basic Metabolic Panel: Recent Labs  Lab 02/26/23 1205 03/03/23 1558 03/03/23 2252  NA 140 135 140  K 3.6 4.1 3.9  CL 111 111  --   CO2 20* 11*  --   GLUCOSE 86 161*  --   BUN 19 24*  --   CREATININE 0.88 1.64*  --   CALCIUM 9.5 9.1  --    Liver Function Tests: Recent Labs  Lab 02/26/23 1205  03/03/23 1558  AST 32 30  ALT 26 22  ALKPHOS 50 53  BILITOT 0.5 0.6  PROT 6.7 6.1*  ALBUMIN 3.7 3.4*   Recent Labs  Lab 02/26/23 1205 03/03/23 1558  LIPASE 21 21   No results for input(s): "AMMONIA" in the last 168 hours. CBC: Recent Labs  Lab 02/26/23 1205 03/03/23 1558 03/03/23 2252  WBC 3.5* 5.9  --   HGB 11.0* 12.1 10.2*  HCT 34.8* 37.6 30.0*  MCV 90.4 86.6  --   PLT 272 356  --    Cardiac Enzymes: No results for input(s): "CKTOTAL", "CKMB", "CKMBINDEX", "TROPONINI" in the last 168 hours. BNP (last 3 results) No results for input(s): "BNP" in the last 8760 hours.  ProBNP (last 3 results) No results for input(s): "PROBNP" in the last 8760 hours.  CBG: No results for input(s): "GLUCAP" in the last 168 hours.  Radiological Exams on Admission: No results found.  Assessment/Plan Donna Wiley is a 83 y.o. female with medical history significant for type 1 diabetes on insulin pump, paroxysmal atrial fibrillation on Xarelto and iron deficiency anemia being admitted to the hospital with persistent chronic diarrhea.   Persistent chronic diarrhea-fortunately extensive workup with gastroenterology has thus far been unrevealing.  -Observation admission -Continue Questran, Lomotil, Creon, Xifaxan -Discussed with [facility], who will consult -Follow-up GI panel and C. difficile screen  Non-anion gap metabolic acidosis-due to dehydration and AKI -Continue LR infusion with bicarb -Will recheck BMP now and adjust accordingly  Hyperlipidemia-Zocor  Paroxysmal atrial fibrillation-continue Tambocor, Cardizem, Xarelto  Type 1 diabetes-patient to manage her insulin pump, carb modified diet  DVT prophylaxis: Lovenox     Code Status: Full Code  Consults called: [facility]  Admission status: The appropriate patient status for this patient is INPATIENT. Inpatient status is judged to be reasonable and necessary in order to provide the required intensity of service  to ensure the patient's safety. The patient's presenting symptoms, physical exam findings, and initial radiographic and laboratory data in the context of their chronic comorbidities is felt to place them at high risk for further clinical deterioration. Furthermore, it is not anticipated that the patient will be  medically stable for discharge from the hospital within 2 midnights of admission.    I certify that at the point of admission it is my clinical judgment that the patient will require inpatient hospital care spanning beyond 2 midnights from the point of admission due to high intensity of service, high risk for further deterioration and high frequency of surveillance required  Time spent: 48 minutes  Julyanna Scholle Sharlette Dense MD [facility] Pager [phone]  If 7PM-7AM, please contact night-coverage [email or web address] Password [email or web address]  03/04/2023, 9:45 AM

## 2023-03-04 NOTE — Progress Notes (Signed)
Pharmacy contacted regarding patient's meds needing to be verified. Stated waiting on med rec to be completed.

## 2023-03-04 NOTE — Progress Notes (Addendum)
Pt's CBG is 46 and asymptomatic. Pt's Dexcom shown is 63. 2 apple juice offer to patient.  2219: 1/2 amp D50 given.

## 2023-03-04 NOTE — Progress Notes (Signed)
On prior admission cholestyramine, ondansetron, and diphenoxylate/atropine meds left in room and found after patient discharged. Patients meds are locked in pharmacy.

## 2023-03-04 NOTE — Consult Note (Signed)
[facility] CONSULTATION NOTE  Referring: [facility] Primary GI: [facility] (unassigned here)  HISTORY OF PRESENT ILLNESS:  Donna Wiley is a pleasant 83 y.o. female from [facility] with past medical history as listed below including 10 years of diabetes mellitus.  She presents to the hospital for the third time in 2 months with relentless diarrhea.  Initial evaluation in December.  Most recent evaluation as recent as February 23 2023 when she was discharged.  Please see those hospitalizations for details.  In summary, she has had colonoscopy with biopsies, upper endoscopy with biopsies, and capsule endoscopy.  Negative CT abdomen and pelvis.  No diagnosis.  Multiple studies for various pathogens have returned negative.  She did have decreased fecal elastase (which is nonspecific in the face of diarrhea) and elevated qualitative fat.  Was sent home on multiple empiric therapies, which she was compliant, including Creon, Questran, Lomotil, and Xifaxan which she is continuing.  She has watery diarrhea multiple times per day.  Through the night.  No bleeding.  No abdominal pain.  Since this illness began in November, she is lost 20 pounds.  No new medications prior to all of these issues.  She does have an appointment to see her primary GI, elsewhere, this Monday.  REVIEW OF SYSTEMS:  All non-GI ROS negative except for fatigue  Past Medical History:  Diagnosis Date   Arthritis    Asthma    childhood   Atrial fibrillation (HCC)    Cystocele    Diabetes mellitus    GERD (gastroesophageal reflux disease)    High cholesterol    Kidney stone    Rectocele    SVT (supraventricular tachycardia) (HCC)     Past Surgical History:  Procedure Laterality Date   ABDOMINAL HYSTERECTOMY     APPENDECTOMY     BIOPSY  01/13/2023   Procedure: BIOPSY;  Surgeon: Sherrilyn Rist, MD;  Location: [facility] ENDOSCOPY;  Service: Gastroenterology;;   BIOPSY  02/22/2023   Procedure: BIOPSY;   Surgeon: Shellia Cleverly, DO;  Location: [facility] ENDOSCOPY;  Service: Gastroenterology;;   CATARACT EXTRACTION     CHOLECYSTECTOMY     COLONOSCOPY WITH PROPOFOL N/A 01/13/2023   Procedure: COLONOSCOPY WITH PROPOFOL;  Surgeon: Sherrilyn Rist, MD;  Location: [facility] ENDOSCOPY;  Service: Gastroenterology;  Laterality: N/A;   ESOPHAGOGASTRODUODENOSCOPY Left 02/22/2023   Procedure: ESOPHAGOGASTRODUODENOSCOPY (EGD);  Surgeon: Shellia Cleverly, DO;  Location: [facility] ENDOSCOPY;  Service: Gastroenterology;  Laterality: Left;   FOOT SURGERY     GIVENS CAPSULE STUDY N/A 02/20/2023   Procedure: GIVENS CAPSULE STUDY;  Surgeon: Shellia Cleverly, DO;  Location: [facility] ENDOSCOPY;  Service: Gastroenterology;  Laterality: N/A;   HAND SURGERY     ROTATOR CUFF REPAIR      Social History [name]  reports that she has never smoked. She has never been exposed to tobacco smoke. She has never used smokeless tobacco. She reports that she does not drink alcohol and does not use drugs.  family history includes Allergic rhinitis in her daughter; Asthma in her daughter; Lupus in her daughter; Scoliosis in her daughter.  Allergies  Allergen Reactions   Iodine Anaphylaxis   Ivp Dye [Iodinated Contrast Media] Anaphylaxis   Nickel Rash   Charentais Melon (French Melon) Swelling   Cherry Swelling   Codeine Other (See Comments)    Hallucinations    Other Swelling    Tree nuts    Shellfish Allergy Swelling   Sulfa Antibiotics Swelling  Tape Dermatitis    Paper tape is best PAPER TAPE        PHYSICAL EXAMINATION: Vital signs: BP (!) 113/57 (BP Location: Left Arm)   Pulse 100   Temp 99.4 F (37.4 C) (Oral)   Resp 14   Ht 5\' 2"  (1.575 m)   Wt 53.6 kg   SpO2 97%   BMI 21.61 kg/m   Constitutional: generally well-appearing, no acute distress Psychiatric: Pleasant, alert and oriented x3, cooperative Eyes: extraocular movements intact, anicteric, conjunctiva pink Mouth: oral pharynx moist, no  lesions Neck: supple no lymphadenopathy Cardiovascular: heart regular rate and rhythm, no murmur Lungs: clear to auscultation bilaterally Abdomen: soft, nontender, nondistended, no obvious ascites, no peritoneal signs, normal bowel sounds, no organomegaly Rectal: Omitted Extremities: no clubbing, cyanosis, or lower extremity edema bilaterally Skin: no lesions on visible extremities Neuro: No focal deficits.  Cranial nerves intact  ASSESSMENT:  1.  Persistent diarrheal illness.  Seems secretory.  Noninflammatory.  Need to expand workup. 2.  General Medical problems   PLAN:  1.  Empiric course of metronidazole 250 mg p.o. 4 times daily x 10 days 2.  24-hour urine for 5-HIAA 3.  Gastrin level 4.  VIP (vasoactive intestinal peptide) level 5.  Lomotil 2 p.o. 3 times daily  Will follow.  Wilhemina Bonito. Eda Keys., M.D. [facility] Division of Gastroenterology

## 2023-03-04 NOTE — ED Notes (Signed)
Pt ambulated independently to restroom

## 2023-03-05 DIAGNOSIS — E876 Hypokalemia: Secondary | ICD-10-CM | POA: Diagnosis not present

## 2023-03-05 DIAGNOSIS — R197 Diarrhea, unspecified: Secondary | ICD-10-CM | POA: Diagnosis not present

## 2023-03-05 DIAGNOSIS — K529 Noninfective gastroenteritis and colitis, unspecified: Secondary | ICD-10-CM | POA: Diagnosis not present

## 2023-03-05 LAB — GASTROINTESTINAL PANEL BY PCR, STOOL (REPLACES STOOL CULTURE)

## 2023-03-05 LAB — CBC
HCT: 31 % — ABNORMAL LOW (ref 36.0–46.0)
Hemoglobin: 10 g/dL — ABNORMAL LOW (ref 12.0–15.0)
MCH: 28 pg (ref 26.0–34.0)
MCHC: 32.3 g/dL (ref 30.0–36.0)
MCV: 86.8 fL (ref 80.0–100.0)
Platelets: 309 10*3/uL (ref 150–400)
RBC: 3.57 MIL/uL — ABNORMAL LOW (ref 3.87–5.11)
RDW: 14.7 % (ref 11.5–15.5)
WBC: 5 10*3/uL (ref 4.0–10.5)
nRBC: 0 % (ref 0.0–0.2)

## 2023-03-05 LAB — BASIC METABOLIC PANEL
Anion gap: 8 (ref 5–15)
BUN: 18 mg/dL (ref 8–23)
CO2: 23 mmol/L (ref 22–32)
Calcium: 8.6 mg/dL — ABNORMAL LOW (ref 8.9–10.3)
Chloride: 109 mmol/L (ref 98–111)
Creatinine, Ser: 0.72 mg/dL (ref 0.44–1.00)
GFR, Estimated: >60 mL/min (ref >60)
Glucose, Bld: 233 mg/dL — ABNORMAL HIGH (ref 70–99)
Potassium: 3.1 mmol/L — ABNORMAL LOW (ref 3.5–5.1)
Sodium: 140 mmol/L (ref 135–145)

## 2023-03-05 LAB — GLUCOSE, CAPILLARY
Glucose-Capillary: 196 mg/dL — ABNORMAL HIGH (ref 70–99)
Glucose-Capillary: 144 mg/dL — ABNORMAL HIGH (ref 70–99)
Glucose-Capillary: 13 mg/dL — CL (ref 70–99)
Glucose-Capillary: 28 mg/dL — CL (ref 70–99)
Glucose-Capillary: 40 mg/dL — CL (ref 70–99)
Glucose-Capillary: 52 mg/dL — ABNORMAL LOW (ref 70–99)
Glucose-Capillary: 80 mg/dL (ref 70–99)
Glucose-Capillary: 170 mg/dL — ABNORMAL HIGH (ref 70–99)
Glucose-Capillary: 386 mg/dL — ABNORMAL HIGH (ref 70–99)
Glucose-Capillary: 365 mg/dL — ABNORMAL HIGH (ref 70–99)
Glucose-Capillary: 174 mg/dL — ABNORMAL HIGH (ref 70–99)

## 2023-03-05 MED ORDER — INSULIN ASPART 100 UNIT/ML IJ SOLN
0.0000 [IU] | Freq: Three times a day (TID) | INTRAMUSCULAR | Status: DC
  Administered 2023-03-05: 5 [IU] via SUBCUTANEOUS
  Administered 2023-03-06 – 2023-03-09 (×8): 1 [IU] via SUBCUTANEOUS
  Administered 2023-03-10: 2 [IU] via SUBCUTANEOUS
  Administered 2023-03-10: 3 [IU] via SUBCUTANEOUS
  Administered 2023-03-10: 2 [IU] via SUBCUTANEOUS
  Administered 2023-03-11: 4 [IU] via SUBCUTANEOUS

## 2023-03-05 MED ORDER — INSULIN GLARGINE-YFGN 100 UNIT/ML ~~LOC~~ SOLN
7.0000 [IU] | Freq: Every day | SUBCUTANEOUS | Status: DC
  Administered 2023-03-05 – 2023-03-12 (×8): 7 [IU] via SUBCUTANEOUS
  Filled 2023-03-05 (×9): qty 0.07

## 2023-03-05 MED ORDER — SODIUM CHLORIDE 0.9 % IV SOLN: INTRAVENOUS | Status: AC

## 2023-03-05 MED ORDER — INSULIN ASPART 100 UNIT/ML IJ SOLN
0.0000 [IU] | Freq: Every day | INTRAMUSCULAR | Status: DC
  Administered 2023-03-10 – 2023-03-11 (×2): 2 [IU] via SUBCUTANEOUS
  Administered 2023-03-12: 4 [IU] via SUBCUTANEOUS

## 2023-03-05 MED ORDER — POTASSIUM CHLORIDE CRYS ER 20 MEQ PO TBCR
40.0000 meq | EXTENDED_RELEASE_TABLET | Freq: Once | ORAL | Status: AC
  Administered 2023-03-05: 40 meq via ORAL
  Filled 2023-03-05: qty 2

## 2023-03-05 NOTE — Progress Notes (Signed)
PROGRESS NOTE  Donna Wiley WUJ:[id number] DOB: 1940/09/26 DOA: 03/03/2023 PCP: Raquel James, MD   LOS: 1 day   Brief Narrative / Interim history: 83 year old female with DM1 on insulin pump, PAF on Xarelto, iron deficiency anemia who comes into the hospital with acute on chronic diarrhea.  She has been having persistent issues with up to 30 liquid bowel movements per day since last Thanksgiving.  She has had couple hospitalizations for the same issue, gets better but when she goes back home her diarrhea immediately returns.  She has had an extensive workup by gastroenterology without a clear cause  Subjective / 24h Interval events: Very frustrated to be back in the hospital.  She denies any abdominal discomfort, nausea or vomiting today  Assesement and Plan: Principal Problem:   Acute diarrhea Active Problems:   Loss of weight  Principal problem Persistent acute on chronic diarrhea -gastroenterology consulted, appreciate input.  She has been admitted several times since last November, she has underwent extensive workup all negative -Continue to monitor, appreciate GI input, check 5 HIAA 24-hour urine, VIP, gastrin -C. difficile and GI pathogen panel negative  Active problems AKI-due to dehydration, diarrhea, creatinine on admission 1.6, improved with fluids at 0.7 this morning  Hypokalemia-replace potassium, monitor magnesium as well as phosphorus  Hyperlipidemia-continue statin  PAF-continue home medication with diltiazem, flecainide, Xarelto  DM1 -significant hypoglycemia this morning, pump discontinued and will use subcutaneous insulin for now  Lab Results  Component Value Date   HGBA1C 7.7 (H) 02/15/2023   CBG (last 3)  Recent Labs    03/05/23 1031 03/05/23 1044 03/05/23 1105  GLUCAP 28* 40* 52*   Scheduled Meds:  cholestyramine light  4 g Oral BID   diltiazem  180 mg Oral QHS   diphenoxylate-atropine  2 tablet Oral TID   flecainide  100 mg Oral QHS    flecainide  50 mg Oral Daily   insulin aspart  0-5 Units Subcutaneous QHS   insulin aspart  0-6 Units Subcutaneous TID WC   insulin glargine-yfgn  7 Units Subcutaneous Daily   lipase/protease/amylase  72,000 Units Oral TID WC   LORazepam  0.5 mg Oral QHS   metroNIDAZOLE  250 mg Oral Q6H   potassium chloride  40 mEq Oral Once   rivaroxaban  15 mg Oral Q supper   simvastatin  10 mg Oral QHS   Continuous Infusions:  sodium bicarbonate 150 mEq in dextrose 5 % 1,150 mL infusion 125 mL/hr at 03/05/23 0836   PRN Meds:.acetaminophen **OR** acetaminophen, albuterol, ondansetron **OR** ondansetron (ZOFRAN) IV, traZODone  Current Outpatient Medications  Medication Instructions   acetaminophen (TYLENOL) 1,000 mg, Every 6 hours PRN   b complex vitamins capsule 1 capsule, Every evening   calcium citrate (CALCITRATE - DOSED IN MG ELEMENTAL CALCIUM) 950 (200 Ca) MG tablet 500 mg of elemental calcium, 2 times daily   cholestyramine (QUESTRAN) 4 g, Oral, 2 times daily   clobetasol cream (TEMOVATE) 0.05 % 1 Application, 2 times daily PRN   Creon 72,000 Units, Oral, 3 times daily with meals   diltiazem (CARDIZEM CD) 180 mg, Nightly   diphenoxylate-atropine (LOMOTIL) 2.5-0.025 MG tablet 1 tablet, Oral, 4 times daily   EPINEPHrine (EPI-PEN) 0.3 mg, As needed   flecainide (TAMBOCOR) 50-100 mg, See admin instructions   insulin lispro (HUMALOG) 0-15 Units, 4 times daily   LORazepam (ATIVAN) 0.5 mg, Nightly   mirabegron ER (MYRBETRIQ) 50 mg, Every morning   olmesartan-hydrochlorothiazide (BENICAR HCT) 40-12.5 MG tablet 1 tablet, Every  morning   ondansetron (ZOFRAN-ODT) 4 mg, Oral, Every 8 hours PRN   rivaroxaban (XARELTO) 20 mg, Every evening   simvastatin (ZOCOR) 10 mg, Daily at bedtime   Xifaxan 550 mg, Oral, 3 times daily    Diet Orders (From admission, onward)     Start     Ordered   03/04/23 1013  Diet Carb Modified Fluid consistency: Thin  Diet effective now       Question Answer Comment   Calorie Level Medium 1600-2000   Fluid consistency: Thin      03/04/23 1012            DVT prophylaxis: SCDs Start: 03/04/23 0945 Rivaroxaban (XARELTO) tablet 15 mg   Lab Results  Component Value Date   PLT 309 03/05/2023      Code Status: Full Code  Family Communication: No family at bedside  Status is: Inpatient Remains inpatient appropriate because: severity of illness  Level of care: Progressive  Consultants:  GI  Objective: Vitals:   03/04/23 1324 03/04/23 1712 03/04/23 2140 03/05/23 0302  BP: (!) 113/57 121/61 129/68 119/60  Pulse: 100 (!) 106 91 79  Resp: 14 14 18 17   Temp: 99.4 F (37.4 C) 98.9 F (37.2 C) 99.1 F (37.3 C) 98.5 F (36.9 C)  TempSrc: Oral Oral Oral Oral  SpO2: 97% 100% 99% 97%  Weight:      Height:        Intake/Output Summary (Last 24 hours) at 03/05/2023 1119 Last data filed at 03/05/2023 0844 Gross per 24 hour  Intake 1335.53 ml  Output 2 ml  Net 1333.53 ml   Wt Readings from Last 3 Encounters:  03/04/23 53.6 kg  02/26/23 50 kg  02/13/23 54.8 kg    Examination:  Constitutional: NAD Eyes: no scleral icterus ENMT: Mucous membranes are moist.  Neck: normal, supple Respiratory: clear to auscultation bilaterally, no wheezing, no crackles. Cardiovascular: Regular rate and rhythm, no murmurs / rubs / gallops.  Abdomen: non distended, no tenderness. Bowel sounds positive.  Musculoskeletal: no clubbing / cyanosis.   Data Reviewed: I have independently reviewed following labs and imaging studies   CBC Recent Labs  Lab 02/26/23 1205 03/03/23 1558 03/03/23 2252 03/05/23 0535  WBC 3.5* 5.9  --  5.0  HGB 11.0* 12.1 10.2* 10.0*  HCT 34.8* 37.6 30.0* 31.0*  PLT 272 356  --  309  MCV 90.4 86.6  --  86.8  MCH 28.6 27.9  --  28.0  MCHC 31.6 32.2  --  32.3  RDW 14.4 14.6  --  14.7    Recent Labs  Lab 02/26/23 1205 03/03/23 1558 03/03/23 2252 03/04/23 1112 03/05/23 0535  NA 140 135 140 137 140  K 3.6 4.1 3.9 3.2*  3.1*  CL 111 111  --  109 109  CO2 20* 11*  --  21* 23  GLUCOSE 86 161*  --  209* 233*  BUN 19 24*  --  21 18  CREATININE 0.88 1.64*  --  0.90 0.72  CALCIUM 9.5 9.1  --  8.3* 8.6*  AST 32 30  --   --   --   ALT 26 22  --   --   --   ALKPHOS 50 53  --   --   --   BILITOT 0.5 0.6  --   --   --   ALBUMIN 3.7 3.4*  --   --   --     ------------------------------------------------------------------------------------------------------------------ No results for input(s): "CHOL", "  HDL", "LDLCALC", "TRIG", "CHOLHDL", "LDLDIRECT" in the last 72 hours.  Lab Results  Component Value Date   HGBA1C 7.7 (H) 02/15/2023   ------------------------------------------------------------------------------------------------------------------ No results for input(s): "TSH", "T4TOTAL", "T3FREE", "THYROIDAB" in the last 72 hours.  Invalid input(s): "FREET3"  Cardiac Enzymes No results for input(s): "CKMB", "TROPONINI", "MYOGLOBIN" in the last 168 hours.  Invalid input(s): "CK" ------------------------------------------------------------------------------------------------------------------ No results found for: "BNP"  CBG: Recent Labs  Lab 03/05/23 0733 03/05/23 1012 03/05/23 1031 03/05/23 1044 03/05/23 1105  GLUCAP 144* 13* 28* 40* 52*    Recent Results (from the past 240 hours)  C Difficile Quick Screen w PCR reflex     Status: None   Collection Time: 03/03/23 10:16 PM   Specimen: Stool  Result Value Ref Range Status   C Diff antigen NEGATIVE NEGATIVE Final   C Diff toxin NEGATIVE NEGATIVE Final   C Diff interpretation No C. difficile detected.  Final    Comment: Performed at [facility], [address], [place], [place] [address]     Radiology Studies: No results found.   Pamella Pert, MD, PhD [facility]  Between 7 am - 7 pm I am available, please contact me via Amion (for emergencies) or Securechat (non urgent messages)  Between 7 pm - 7 am I am not  available, please contact night coverage MD/APP via Amion

## 2023-03-05 NOTE — Progress Notes (Signed)
Blood sugar improved by PO intake, MD okay with pt taking food and drinks for blood sugar increase instead D50 since pt is asymptomatic and in order to avoid high blood sugar spike.   Will continue to monitor.

## 2023-03-05 NOTE — Progress Notes (Signed)
Pt has an insulin pump and she was the one that was managing blood sugars. It was 144 this AM and she gave herself 8 units, ate breakfast after. Called me in the room saying that her Blood sugar is 54 and when we rechecked it was 13. She is completely asymptomatic. Insulin pump is removed. Daughter is going to come and pick up insulin pump per patient. Pt is currently drinking and eating. MD paged for new order.  Will continue to monitor

## 2023-03-05 NOTE — Progress Notes (Addendum)
HISTORY OF PRESENT ILLNESS:  Donna Wiley is a 83 y.o. female admitted to the hospital for the third time in less than 2 months for a 76-month history of intractable diarrhea.  She has undergone extensive workup as previously documented.  The diarrhea seems to be secretory and noninflammatory.  This has been associated with dehydration, renal insufficiency, and hypokalemia.  All of these corrected with appropriate resuscitative and replacement measures.  She is sitting in her bed today listening to her church service.  No change in her clinical condition since yesterday afternoon.  She was just started on empiric metronidazole and Lomotil to 3-4 times daily.  No new complaints.  Laboratories: Hemoglobin 10.0 Potassium 3.1 BUN/creatinine 18/0.72 Albumin 3.4  REVIEW OF SYSTEMS:  All non-GI ROS negative. Past Medical History:  Diagnosis Date   Arthritis    Asthma    childhood   Atrial fibrillation (HCC)    Cystocele    Diabetes mellitus    GERD (gastroesophageal reflux disease)    High cholesterol    Kidney stone    Rectocele    SVT (supraventricular tachycardia) (HCC)     Past Surgical History:  Procedure Laterality Date   ABDOMINAL HYSTERECTOMY     APPENDECTOMY     BIOPSY  01/13/2023   Procedure: BIOPSY;  Surgeon: Sherrilyn Rist, MD;  Location: [facility] ENDOSCOPY;  Service: Gastroenterology;;   BIOPSY  02/22/2023   Procedure: BIOPSY;  Surgeon: Shellia Cleverly, DO;  Location: [facility] ENDOSCOPY;  Service: Gastroenterology;;   CATARACT EXTRACTION     CHOLECYSTECTOMY     COLONOSCOPY WITH PROPOFOL N/A 01/13/2023   Procedure: COLONOSCOPY WITH PROPOFOL;  Surgeon: Sherrilyn Rist, MD;  Location: [facility] ENDOSCOPY;  Service: Gastroenterology;  Laterality: N/A;   ESOPHAGOGASTRODUODENOSCOPY Left 02/22/2023   Procedure: ESOPHAGOGASTRODUODENOSCOPY (EGD);  Surgeon: Shellia Cleverly, DO;  Location: [facility] ENDOSCOPY;  Service: Gastroenterology;  Laterality: Left;   FOOT SURGERY     GIVENS  CAPSULE STUDY N/A 02/20/2023   Procedure: GIVENS CAPSULE STUDY;  Surgeon: Shellia Cleverly, DO;  Location: [facility] ENDOSCOPY;  Service: Gastroenterology;  Laterality: N/A;   HAND SURGERY     ROTATOR CUFF REPAIR      Social History Rhetta Cleek  reports that she has never smoked. She has never been exposed to tobacco smoke. She has never used smokeless tobacco. She reports that she does not drink alcohol and does not use drugs.  family history includes Allergic rhinitis in her daughter; Asthma in her daughter; Lupus in her daughter; Scoliosis in her daughter.  Allergies  Allergen Reactions   Iodine Anaphylaxis   Ivp Dye [Iodinated Contrast Media] Anaphylaxis   Nickel Rash   Charentais Melon (French Melon) Swelling   Cherry Swelling   Codeine Other (See Comments)    Hallucinations    Other Swelling    Tree nuts    Shellfish Allergy Swelling   Sulfa Antibiotics Swelling   Tape Dermatitis    Paper tape is best PAPER TAPE        PHYSICAL EXAMINATION: Vital signs: BP 119/60 (BP Location: Left Arm)   Pulse 79   Temp 98.5 F (36.9 C) (Oral)   Resp 17   Ht 5\' 2"  (1.575 m)   Wt 53.6 kg   SpO2 97%   BMI 21.61 kg/m  General: Well-developed, well-nourished, no acute distress HEENT: Sclerae are anicteric, conjunctiva pink. Oral mucosa intact Lungs: Clear Heart: Regular Abdomen: soft, nontender, nondistended, no obvious ascites, no peritoneal signs, normal bowel  sounds. No organomegaly. Extremities: No edema Psychiatric: alert and oriented x3. Cooperative     ASSESSMENT:  1.  Severe chronic diarrheal illness.  Extensive workup to date and multiple empiric therapies unrevealing/unhelpful. 2.  Hypokalemia   PLAN:  1.  Continue empirically started Flagyl 2.  Correct electrolytes (primary service) 3.  Await gastrin level, VIP level, and 24-hour urine for 5-HIAA. GI will continue to follow.  Dr. Leone Payor to assume the service tomorrow.  Wilhemina Bonito. Eda Keys.,  M.D. [facility] Division of Gastroenterology

## 2023-03-06 DIAGNOSIS — K529 Noninfective gastroenteritis and colitis, unspecified: Secondary | ICD-10-CM | POA: Diagnosis not present

## 2023-03-06 DIAGNOSIS — E860 Dehydration: Secondary | ICD-10-CM

## 2023-03-06 DIAGNOSIS — E876 Hypokalemia: Secondary | ICD-10-CM

## 2023-03-06 DIAGNOSIS — N179 Acute kidney failure, unspecified: Secondary | ICD-10-CM

## 2023-03-06 DIAGNOSIS — R197 Diarrhea, unspecified: Secondary | ICD-10-CM | POA: Diagnosis not present

## 2023-03-06 LAB — MAGNESIUM: Magnesium: 1.3 mg/dL — ABNORMAL LOW (ref 1.7–2.4)

## 2023-03-06 LAB — COMPREHENSIVE METABOLIC PANEL
ALT: 18 U/L (ref 0–44)
AST: 24 U/L (ref 15–41)
Albumin: 2.6 g/dL — ABNORMAL LOW (ref 3.5–5.0)
Alkaline Phosphatase: 41 U/L (ref 38–126)
Anion gap: 7 (ref 5–15)
BUN: 15 mg/dL (ref 8–23)
CO2: 16 mmol/L — ABNORMAL LOW (ref 22–32)
Calcium: 8.3 mg/dL — ABNORMAL LOW (ref 8.9–10.3)
Chloride: 115 mmol/L — ABNORMAL HIGH (ref 98–111)
Creatinine, Ser: 0.62 mg/dL (ref 0.44–1.00)
GFR, Estimated: >60 mL/min (ref >60)
Glucose, Bld: 141 mg/dL — ABNORMAL HIGH (ref 70–99)
Potassium: 3.2 mmol/L — ABNORMAL LOW (ref 3.5–5.1)
Sodium: 138 mmol/L (ref 135–145)
Total Bilirubin: 0.5 mg/dL (ref 0.0–1.2)
Total Protein: 4.6 g/dL — ABNORMAL LOW (ref 6.5–8.1)

## 2023-03-06 LAB — GLUCOSE, CAPILLARY
Glucose-Capillary: 165 mg/dL — ABNORMAL HIGH (ref 70–99)
Glucose-Capillary: 107 mg/dL — ABNORMAL HIGH (ref 70–99)
Glucose-Capillary: 105 mg/dL — ABNORMAL HIGH (ref 70–99)
Glucose-Capillary: 85 mg/dL (ref 70–99)

## 2023-03-06 LAB — CBC
HCT: 32.6 % — ABNORMAL LOW (ref 36.0–46.0)
Hemoglobin: 10.2 g/dL — ABNORMAL LOW (ref 12.0–15.0)
MCH: 28.3 pg (ref 26.0–34.0)
MCHC: 31.3 g/dL (ref 30.0–36.0)
MCV: 90.3 fL (ref 80.0–100.0)
Platelets: 300 10*3/uL (ref 150–400)
RBC: 3.61 MIL/uL — ABNORMAL LOW (ref 3.87–5.11)
RDW: 14.9 % (ref 11.5–15.5)
WBC: 4.8 10*3/uL (ref 4.0–10.5)
nRBC: 0 % (ref 0.0–0.2)

## 2023-03-06 LAB — PHOSPHORUS: Phosphorus: 1.9 mg/dL — ABNORMAL LOW (ref 2.5–4.6)

## 2023-03-06 LAB — GASTRIN: Gastrin: 13 pg/mL (ref 0–115)

## 2023-03-06 NOTE — Progress Notes (Addendum)
Progress Note   Subjective  Chief Complaint: Chronic Diarrhea  Today, patient tells me she continues with the same symptoms.  She was up every hour having watery loose stool overnight and has had 2 stools this morning since 730 (I saw her around 9:00).  Continues with no abdominal pain.  Does ask if maybe she could be transferred to [facility] or a tertiary center to see if anyone else had something different to say.  Patient finished her 24-hour urine collection this morning.  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   Objective   Vital signs in last 24 hours: Temp:  [97.8 F (36.6 C)-98.4 F (36.9 C)] 97.8 F (36.6 C) (02/03 0358) Pulse Rate:  [78-105] 78 (02/03 0358) Resp:  [16-18] 17 (02/03 0358) BP: (114-143)/(56-66) 114/56 (02/03 0358) SpO2:  [97 %-98 %] 98 % (02/03 0358) Last BM Date : 03/05/23 General: Elderly white female in NAD Heart:  Regular rate and rhythm; no murmurs Lungs: Respirations even and unlabored, lungs CTA bilaterally Abdomen:  Soft, nontender and nondistended. Increased BS all four quadrants Psych:  Cooperative. Normal mood and affect.  Intake/Output from previous day: 02/02 0701 - 02/03 0700 In: 3290.7 [P.O.:360; I.V.:2930.7] Out: 25 [Urine:25]  Lab Results: Recent Labs    03/03/23 1558 03/03/23 2252 03/05/23 0535  WBC 5.9  --  5.0  HGB 12.1 10.2* 10.0*  HCT 37.6 30.0* 31.0*  PLT 356  --  309   BMET Recent Labs    03/03/23 1558 03/03/23 2252 03/04/23 1112 03/05/23 0535  NA 135 140 137 140  K 4.1 3.9 3.2* 3.1*  CL 111  --  109 109  CO2 11*  --  21* 23  GLUCOSE 161*  --  209* 233*  BUN 24*  --  21 18  CREATININE 1.64*  --  0.90 0.72  CALCIUM 9.1  --  8.3* 8.6*      Latest Ref Rng & Units 03/03/2023    3:58 PM 02/26/2023   12:05 PM 02/18/2023    4:33 AM  Hepatic Function  Total Protein 6.5 - 8.1 g/dL 6.1  6.7  4.4    Albumin 3.5 - 5.0 g/dL 3.4  3.7  2.5   AST 15 - 41 U/L 30  32  19   ALT 0 - 44 U/L 22  26  16    Alk Phosphatase 38 - 126 U/L 53  50  38   Total Bilirubin 0.0 - 1.2 mg/dL 0.6  0.5  0.4      Assessment / Plan:   Assessment: 1.  Severe chronic diarrheal illness: Extensive workup to date with multiple empiric therapies unrevealing/unhelpful 2.  Hypokalemia  Plan: 1.  Continue on empiric Flagyl 2.  Continue to correct electrolytes 3.  Awaiting Gastrin level, VIP level and 24-hour urine for 5-HIAA (24-hour urine completed this morning around 8:30 AM) 4.  Will discuss case further with Dr. Leone Payor, maybe a fecal transplant would be a possibility, could get her in touch with someone outpatient for further eval  Thank for your kind consultation, we will continue to follow.   LOS: 2 days   Unk Lightning  03/06/2023, 10:09 AM     [facility] Attending   I have taken an interval history, reviewed the chart and examined the patient. I agree with the Advanced Practitioner's note, impression and recommendations with the following additions:  Watery diarrhea of unclear etiology unresponsive to multiple therapies.  1) fecal transplant is not indicated in her case - recurrent C diff is the indication I am aware of  2) though unlikely - olmesartan remains a possible etiology - it was restarted by her PCP when she went home last time. This would be a severe case of diarrhea from that but can occur. Causes a sprue-like syndrome. Duodenal bxs 02/22/23 "acute duodenitis" - I will have these reviewed. In most cases diarrhea stops when the drug is dced as it has been (albeit temporarily). Dr. Luisa Hart of pathology will review the pathology and get back.  3) This is not pancreatic insufficiency - that would be oily or greasy stools. The abnormal fecal elastase must be related to watery diarrhea causing dilution. Will stop Creon - has not helped.  4) She is type 1 diabetic - ? Diabetic diarrhea but  does not seem to have neuropathy so doubt  Await 5HIAA urine results, gastrin and VIP levels.  Continue other tx for now.  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

## 2023-03-06 NOTE — TOC Progression Note (Signed)
Transition of Care [facility]) - Inpatient Brief Assessment  Patient Details  Name: Donna Wiley MRN: [id number] Date of Birth: [birth year]  Transition of [facility]) CM/SW Contact:    Ewing Schlein, LCSW Phone Number: 03/06/2023, 1:41 PM  Clinical Narrative: TOC screening completed and there are no current needs identified. CSW followed up with patient's daughter, Larissa Pegg, regarding the patient being listed as having a legal guardian. Daughter stated, "we don't know where that came from" and she confirmed she does not have HCPOA for the patient.  Transition of Care Asessment: Insurance and Status: Insurance coverage has been reviewed Patient has primary care physician: Yes Home environment has been reviewed: Resides alone in single family home Prior level of function:: Indepent with ADLs at baseline Prior/Current Home Services: No current home services Social Drivers of Health Review: SDOH reviewed no interventions necessary Readmission risk has been reviewed: Yes Transition of care needs: no transition of care needs at this time  Social Determinants of Health (SDOH) Interventions SDOH Screenings   Food Insecurity: No Food Insecurity (03/04/2023)  Housing: Low Risk  (03/04/2023)  Transportation Needs: No Transportation Needs (03/04/2023)  Utilities: Not At Risk (03/04/2023)  Social Connections: Moderately Integrated (03/04/2023)  Tobacco Use: Low Risk  (03/03/2023)   Readmission Risk Interventions    03/06/2023   10:50 AM  Readmission Risk Prevention Plan  Transportation Screening Complete  HRI or Home Care Consult Complete  Social Work Consult for Recovery Care Planning/Counseling Complete  Palliative Care Screening Not Applicable  Medication Review Oceanographer) Complete

## 2023-03-06 NOTE — Progress Notes (Signed)
PROGRESS NOTE  Donna Wiley BMW:[id number] DOB: 1940-09-26 DOA: 03/03/2023 PCP: Raquel James, MD   LOS: 2 days   Brief Narrative / Interim history: 83 year old female with DM1 on insulin pump, PAF on Xarelto, iron deficiency anemia who comes into the hospital with acute on chronic diarrhea.  She has been having persistent issues with up to 30 liquid bowel movements per day since last Thanksgiving.  She has had couple hospitalizations for the same issue, gets better but when she goes back home her diarrhea immediately returns.  She has had an extensive workup by gastroenterology without a clear cause  Subjective / 24h Interval events: Continues to have significant frequent stools  Assesement and Plan: Principal Problem:   Acute diarrhea Active Problems:   Loss of weight   Hypokalemia  Principal problem Persistent acute on chronic diarrhea -gastroenterology consulted, appreciate input.  She has been admitted several times since last November, she has underwent extensive workup all negative -Continue to monitor, appreciate GI input, check 5 HIAA 24-hour urine, VIP, gastrin -C. difficile and GI pathogen panel negative -Appreciate GI follow-up  Active problems AKI-due to dehydration, diarrhea, creatinine on admission 1.6, creatinine now normalized with fluids  Hypokalemia-continue to monitor and replace as indicated.  Monitor potassium, phosphorus  Hyperlipidemia-continue statin  PAF-continue home medication with diltiazem, flecainide, Xarelto  DM1 -significant hypoglycemia this morning, pump discontinued and will use subcutaneous insulin for now, CBGs are stable  Lab Results  Component Value Date   HGBA1C 7.7 (H) 02/15/2023   CBG (last 3)  Recent Labs    03/05/23 2054 03/06/23 0751 03/06/23 1123  GLUCAP 174* 165* 107*   Scheduled Meds:  cholestyramine light  4 g Oral BID   diltiazem  180 mg Oral QHS   diphenoxylate-atropine  2 tablet Oral TID   flecainide  100  mg Oral QHS   flecainide  50 mg Oral Daily   insulin aspart  0-5 Units Subcutaneous QHS   insulin aspart  0-6 Units Subcutaneous TID WC   insulin glargine-yfgn  7 Units Subcutaneous Daily   lipase/protease/amylase  72,000 Units Oral TID WC   LORazepam  0.5 mg Oral QHS   metroNIDAZOLE  250 mg Oral Q6H   rivaroxaban  15 mg Oral Q supper   simvastatin  10 mg Oral QHS   Continuous Infusions:  sodium chloride 125 mL/hr at 03/06/23 0129   PRN Meds:.acetaminophen **OR** acetaminophen, albuterol, ondansetron **OR** ondansetron (ZOFRAN) IV, traZODone  Current Outpatient Medications  Medication Instructions   acetaminophen (TYLENOL) 1,000 mg, Every 6 hours PRN   b complex vitamins capsule 1 capsule, Every evening   calcium citrate (CALCITRATE - DOSED IN MG ELEMENTAL CALCIUM) 950 (200 Ca) MG tablet 500 mg of elemental calcium, 2 times daily   cholestyramine (QUESTRAN) 4 g, Oral, 2 times daily   clobetasol cream (TEMOVATE) 0.05 % 1 Application, 2 times daily PRN   Creon 72,000 Units, Oral, 3 times daily with meals   diltiazem (CARDIZEM CD) 180 mg, Nightly   diphenoxylate-atropine (LOMOTIL) 2.5-0.025 MG tablet 1 tablet, Oral, 4 times daily   EPINEPHrine (EPI-PEN) 0.3 mg, As needed   flecainide (TAMBOCOR) 50-100 mg, See admin instructions   insulin lispro (HUMALOG) 0-15 Units, 4 times daily   LORazepam (ATIVAN) 0.5 mg, Nightly   mirabegron ER (MYRBETRIQ) 50 mg, Every morning   olmesartan-hydrochlorothiazide (BENICAR HCT) 40-12.5 MG tablet 1 tablet, Every morning   ondansetron (ZOFRAN-ODT) 4 mg, Oral, Every 8 hours PRN   rivaroxaban (XARELTO) 20 mg, Every evening  simvastatin (ZOCOR) 10 mg, Daily at bedtime   Xifaxan 550 mg, Oral, 3 times daily    Diet Orders (From admission, onward)     Start     Ordered   03/04/23 1013  Diet Carb Modified Fluid consistency: Thin  Diet effective now       Question Answer Comment  Calorie Level Medium 1600-2000   Fluid consistency: Thin      03/04/23  1012            DVT prophylaxis: SCDs Start: 03/04/23 0945 Rivaroxaban (XARELTO) tablet 15 mg   Lab Results  Component Value Date   PLT 300 03/06/2023      Code Status: Full Code  Family Communication: No family at bedside  Status is: Inpatient Remains inpatient appropriate because: severity of illness  Level of care: Progressive  Consultants:  GI  Objective: Vitals:   03/05/23 0302 03/05/23 1300 03/05/23 2055 03/06/23 0358  BP: 119/60 (!) 143/65 121/66 (!) 114/56  Pulse: 79 96 (!) 105 78  Resp: 17 18 16 17   Temp: 98.5 F (36.9 C) 98.4 F (36.9 C) 98.1 F (36.7 C) 97.8 F (36.6 C)  TempSrc: Oral Oral Oral Oral  SpO2: 97% 98% 97% 98%  Weight:      Height:        Intake/Output Summary (Last 24 hours) at 03/06/2023 1138 Last data filed at 03/06/2023 0358 Gross per 24 hour  Intake 2195.13 ml  Output 25 ml  Net 2170.13 ml   Wt Readings from Last 3 Encounters:  03/04/23 53.6 kg  02/26/23 50 kg  02/13/23 54.8 kg    Examination:  Constitutional: NAD Eyes: lids and conjunctivae normal, no scleral icterus ENMT: mmm Neck: normal, supple Respiratory: clear to auscultation bilaterally, no wheezing, no crackles. Normal respiratory effort.  Cardiovascular: Regular rate and rhythm, no murmurs / rubs / gallops. No LE edema. Abdomen: soft, no distention, no tenderness. Bowel sounds positive.   Data Reviewed: I have independently reviewed following labs and imaging studies   CBC Recent Labs  Lab 03/03/23 1558 03/03/23 2252 03/05/23 0535 03/06/23 1106  WBC 5.9  --  5.0 4.8  HGB 12.1 10.2* 10.0* 10.2*  HCT 37.6 30.0* 31.0* 32.6*  PLT 356  --  309 300  MCV 86.6  --  86.8 90.3  MCH 27.9  --  28.0 28.3  MCHC 32.2  --  32.3 31.3  RDW 14.6  --  14.7 14.9    Recent Labs  Lab 03/03/23 1558 03/03/23 2252 03/04/23 1112 03/05/23 0535  NA 135 140 137 140  K 4.1 3.9 3.2* 3.1*  CL 111  --  109 109  CO2 11*  --  21* 23  GLUCOSE 161*  --  209* 233*  BUN 24*   --  21 18  CREATININE 1.64*  --  0.90 0.72  CALCIUM 9.1  --  8.3* 8.6*  AST 30  --   --   --   ALT 22  --   --   --   ALKPHOS 53  --   --   --   BILITOT 0.6  --   --   --   ALBUMIN 3.4*  --   --   --     ------------------------------------------------------------------------------------------------------------------ No results for input(s): "CHOL", "HDL", "LDLCALC", "TRIG", "CHOLHDL", "LDLDIRECT" in the last 72 hours.  Lab Results  Component Value Date   HGBA1C 7.7 (H) 02/15/2023   ------------------------------------------------------------------------------------------------------------------ No results for input(s): "TSH", "T4TOTAL", "T3FREE", "THYROIDAB" in  the last 72 hours.  Invalid input(s): "FREET3"  Cardiac Enzymes No results for input(s): "CKMB", "TROPONINI", "MYOGLOBIN" in the last 168 hours.  Invalid input(s): "CK" ------------------------------------------------------------------------------------------------------------------ No results found for: "BNP"  CBG: Recent Labs  Lab 03/05/23 1526 03/05/23 1623 03/05/23 2054 03/06/23 0751 03/06/23 1123  GLUCAP 386* 365* 174* 165* 107*    Recent Results (from the past 240 hours)  Gastrointestinal Panel by PCR , Stool     Status: None   Collection Time: 03/03/23 10:16 PM   Specimen: Stool  Result Value Ref Range Status   Campylobacter species NOT DETECTED NOT DETECTED Final   Plesimonas shigelloides NOT DETECTED NOT DETECTED Final   Salmonella species NOT DETECTED NOT DETECTED Final   Yersinia enterocolitica NOT DETECTED NOT DETECTED Final   Vibrio species NOT DETECTED NOT DETECTED Final   Vibrio cholerae NOT DETECTED NOT DETECTED Final   Enteroaggregative E coli (EAEC) NOT DETECTED NOT DETECTED Final   Enteropathogenic E coli (EPEC) NOT DETECTED NOT DETECTED Final   Enterotoxigenic E coli (ETEC) NOT DETECTED NOT DETECTED Final   Shiga like toxin producing E coli (STEC) NOT DETECTED NOT DETECTED Final    Shigella/Enteroinvasive E coli (EIEC) NOT DETECTED NOT DETECTED Final   Cryptosporidium NOT DETECTED NOT DETECTED Final   Cyclospora cayetanensis NOT DETECTED NOT DETECTED Final   Entamoeba histolytica NOT DETECTED NOT DETECTED Final   Giardia lamblia NOT DETECTED NOT DETECTED Final   Adenovirus F40/41 NOT DETECTED NOT DETECTED Final   Astrovirus NOT DETECTED NOT DETECTED Final   Norovirus GI/GII NOT DETECTED NOT DETECTED Final   Rotavirus A NOT DETECTED NOT DETECTED Final   Sapovirus (I, II, IV, and V) NOT DETECTED NOT DETECTED Final    Comment: Performed at [facility], [address] [place], [place] [address]  C Difficile Quick Screen w PCR reflex     Status: None   Collection Time: 03/03/23 10:16 PM   Specimen: Stool  Result Value Ref Range Status   C Diff antigen NEGATIVE NEGATIVE Final   C Diff toxin NEGATIVE NEGATIVE Final   C Diff interpretation No C. difficile detected.  Final    Comment: Performed at [facility], [address], [place], [place] [address]     Radiology Studies: No results found.   Pamella Pert, MD, PhD [facility]  Between 7 am - 7 pm I am available, please contact me via Amion (for emergencies) or Securechat (non urgent messages)  Between 7 pm - 7 am I am not available, please contact night coverage MD/APP via Amion

## 2023-03-06 NOTE — Progress Notes (Signed)
Lomotil pill dropped on floor.  One more pulled from pyxis to replace.  Wasted with Allyson Sabal, RN.

## 2023-03-07 DIAGNOSIS — K529 Noninfective gastroenteritis and colitis, unspecified: Secondary | ICD-10-CM | POA: Insufficient documentation

## 2023-03-07 DIAGNOSIS — E860 Dehydration: Secondary | ICD-10-CM | POA: Diagnosis not present

## 2023-03-07 DIAGNOSIS — R197 Diarrhea, unspecified: Secondary | ICD-10-CM | POA: Diagnosis not present

## 2023-03-07 DIAGNOSIS — E876 Hypokalemia: Secondary | ICD-10-CM | POA: Diagnosis not present

## 2023-03-07 DIAGNOSIS — N179 Acute kidney failure, unspecified: Secondary | ICD-10-CM | POA: Diagnosis not present

## 2023-03-07 LAB — COMPREHENSIVE METABOLIC PANEL
ALT: 16 U/L (ref 0–44)
AST: 22 U/L (ref 15–41)
Albumin: 2.4 g/dL — ABNORMAL LOW (ref 3.5–5.0)
Alkaline Phosphatase: 40 U/L (ref 38–126)
Anion gap: 5 (ref 5–15)
BUN: 11 mg/dL (ref 8–23)
CO2: 12 mmol/L — ABNORMAL LOW (ref 22–32)
Calcium: 8.4 mg/dL — ABNORMAL LOW (ref 8.9–10.3)
Chloride: 121 mmol/L — ABNORMAL HIGH (ref 98–111)
Creatinine, Ser: 0.73 mg/dL (ref 0.44–1.00)
GFR, Estimated: >60 mL/min (ref >60)
Glucose, Bld: 159 mg/dL — ABNORMAL HIGH (ref 70–99)
Potassium: 3.7 mmol/L (ref 3.5–5.1)
Sodium: 138 mmol/L (ref 135–145)
Total Bilirubin: 0.5 mg/dL (ref 0.0–1.2)
Total Protein: 4.2 g/dL — ABNORMAL LOW (ref 6.5–8.1)

## 2023-03-07 LAB — CBC
HCT: 31.7 % — ABNORMAL LOW (ref 36.0–46.0)
Hemoglobin: 9.7 g/dL — ABNORMAL LOW (ref 12.0–15.0)
MCH: 28.3 pg (ref 26.0–34.0)
MCHC: 30.6 g/dL (ref 30.0–36.0)
MCV: 92.4 fL (ref 80.0–100.0)
Platelets: 292 10*3/uL (ref 150–400)
RBC: 3.43 MIL/uL — ABNORMAL LOW (ref 3.87–5.11)
RDW: 14.8 % (ref 11.5–15.5)
WBC: 5.7 10*3/uL (ref 4.0–10.5)
nRBC: 0 % (ref 0.0–0.2)

## 2023-03-07 LAB — GLUCOSE, CAPILLARY
Glucose-Capillary: 175 mg/dL — ABNORMAL HIGH (ref 70–99)
Glucose-Capillary: 158 mg/dL — ABNORMAL HIGH (ref 70–99)
Glucose-Capillary: 183 mg/dL — ABNORMAL HIGH (ref 70–99)
Glucose-Capillary: 113 mg/dL — ABNORMAL HIGH (ref 70–99)

## 2023-03-07 LAB — PHOSPHORUS: Phosphorus: 3.1 mg/dL (ref 2.5–4.6)

## 2023-03-07 LAB — MAGNESIUM: Magnesium: 1.4 mg/dL — ABNORMAL LOW (ref 1.7–2.4)

## 2023-03-07 MED ORDER — SODIUM BICARBONATE 650 MG PO TABS
650.0000 mg | ORAL_TABLET | Freq: Three times a day (TID) | ORAL | Status: AC
  Administered 2023-03-07 – 2023-03-08 (×6): 650 mg via ORAL
  Filled 2023-03-07 (×6): qty 1

## 2023-03-07 MED ORDER — POTASSIUM CHLORIDE CRYS ER 20 MEQ PO TBCR
40.0000 meq | EXTENDED_RELEASE_TABLET | Freq: Once | ORAL | Status: AC
  Administered 2023-03-07: 40 meq via ORAL
  Filled 2023-03-07: qty 2

## 2023-03-07 MED ORDER — DIPHENOXYLATE-ATROPINE 2.5-0.025 MG PO TABS
2.0000 | ORAL_TABLET | Freq: Four times a day (QID) | ORAL | Status: DC
  Administered 2023-03-07 – 2023-03-10 (×10): 2 via ORAL
  Filled 2023-03-07 (×10): qty 2

## 2023-03-07 MED ORDER — MAGNESIUM SULFATE 4 GM/100ML IV SOLN
4.0000 g | Freq: Once | INTRAVENOUS | Status: AC
  Administered 2023-03-07: 4 g via INTRAVENOUS
  Filled 2023-03-07: qty 100

## 2023-03-07 NOTE — Progress Notes (Signed)
 PROGRESS NOTE  Donna Wiley FMW:[id number] DOB: 1940-04-02 DOA: 03/03/2023 PCP: Carles Seltzer, MD   LOS: 3 days   Brief Narrative / Interim history: 83 year old female with DM1 on insulin  pump, PAF on Xarelto , iron deficiency anemia who comes into the hospital with acute on chronic diarrhea.  She has been having persistent issues with up to 30 liquid bowel movements per day since last Thanksgiving.  She has had couple hospitalizations for the same issue, gets better but when she goes back home her diarrhea immediately returns.  She has had an extensive workup by gastroenterology without a clear cause  Subjective / 24h Interval events: She reports 18 watery stools yesterday.  No abdominal pain, no nausea or vomiting.  Assesement and Plan: Principal Problem:   Acute diarrhea Active Problems:   Watery diarrhea   Loss of weight   Hypokalemia  Principal problem Persistent acute on chronic diarrhea -gastroenterology consulted, appreciate input.  She has been admitted several times since last November, she has underwent extensive workup all negative -Continue to monitor, appreciate GI input, check 5 HIAA 24-hour urine, VIP, gastrin negative -C. difficile and GI pathogen panel negative -Appreciate GI follow-up -She usually does get better while hospitalized, however gets worse upon arrival home.  Discussed with patient extensively this morning whether there may be any triggers in her home, she does not really identify any  Active problems AKI-due to dehydration, diarrhea, creatinine on admission 1.6, creatinine now normalized with fluids  Hypokalemia, hypokalemia, hypophosphatemia-continue to monitor, replace again magnesium  today, phosphorus and potassium look normal this morning  Nonanion gap metabolic acidosis -bicarb down to 12 today, likely in the setting of GI losses.  Placed on sodium bicarb for couple of days  Hyperlipidemia-continue statin  PAF-continue home medication  with diltiazem , flecainide , Xarelto   DM1 -significant hypoglycemia 2/3, pump discontinued and will use subcutaneous insulin  for now, CBGs are stable  Lab Results  Component Value Date   HGBA1C 7.7 (H) 02/15/2023   CBG (last 3)  Recent Labs    03/06/23 1634 03/06/23 2042 03/07/23 0736  GLUCAP 105* 85 175*   Scheduled Meds:  cholestyramine  light  4 g Oral BID   diltiazem   180 mg Oral QHS   diphenoxylate -atropine   2 tablet Oral TID   flecainide   100 mg Oral QHS   flecainide   50 mg Oral Daily   insulin  aspart  0-5 Units Subcutaneous QHS   insulin  aspart  0-6 Units Subcutaneous TID WC   insulin  glargine-yfgn  7 Units Subcutaneous Daily   LORazepam   0.5 mg Oral QHS   metroNIDAZOLE   250 mg Oral Q6H   rivaroxaban   15 mg Oral Q supper   simvastatin   10 mg Oral QHS   sodium bicarbonate   650 mg Oral TID   Continuous Infusions:   PRN Meds:.acetaminophen  **OR** acetaminophen , albuterol , ondansetron  **OR** ondansetron  (ZOFRAN ) IV, traZODone   Current Outpatient Medications  Medication Instructions   acetaminophen  (TYLENOL ) 1,000 mg, Every 6 hours PRN   b complex vitamins capsule 1 capsule, Every evening   calcium citrate (CALCITRATE - DOSED IN MG ELEMENTAL CALCIUM) 950 (200 Ca) MG tablet 500 mg of elemental calcium, 2 times daily   cholestyramine  (QUESTRAN ) 4 g, Oral, 2 times daily   clobetasol  cream (TEMOVATE ) 0.05 % 1 Application, 2 times daily PRN   Creon  72,000 Units, Oral, 3 times daily with meals   diltiazem  (CARDIZEM  CD) 180 mg, Nightly   diphenoxylate -atropine  (LOMOTIL ) 2.5-0.025 MG tablet 1 tablet, Oral, 4 times daily   EPINEPHrine  (EPI-PEN)  0.3 mg, As needed   flecainide  (TAMBOCOR ) 50-100 mg, See admin instructions   insulin  lispro (HUMALOG) 0-15 Units, 4 times daily   LORazepam  (ATIVAN ) 0.5 mg, Nightly   mirabegron ER (MYRBETRIQ) 50 mg, Every morning   olmesartan-hydrochlorothiazide (BENICAR HCT) 40-12.5 MG tablet 1 tablet, Every morning   ondansetron  (ZOFRAN -ODT) 4  mg, Oral, Every 8 hours PRN   rivaroxaban  (XARELTO ) 20 mg, Every evening   simvastatin  (ZOCOR ) 10 mg, Daily at bedtime   Xifaxan  550 mg, Oral, 3 times daily    Diet Orders (From admission, onward)     Start     Ordered   03/04/23 1013  Diet Carb Modified Fluid consistency: Thin  Diet effective now       Question Answer Comment  Calorie Level Medium 1600-2000   Fluid consistency: Thin      03/04/23 1012            DVT prophylaxis: SCDs Start: 03/04/23 0945 Rivaroxaban  (XARELTO ) tablet 15 mg   Lab Results  Component Value Date   PLT 292 03/07/2023      Code Status: Full Code  Family Communication: No family at bedside  Status is: Inpatient Remains inpatient appropriate because: severity of illness  Level of care: Progressive  Consultants:  GI  Objective: Vitals:   03/05/23 2055 03/06/23 0358 03/06/23 2231 03/07/23 0439  BP: 121/66 (!) 114/56 135/71 126/63  Pulse: (!) 105 78 83 78  Resp: 16 17 16 17   Temp: 98.1 F (36.7 C) 97.8 F (36.6 C) 98.6 F (37 C) 98.2 F (36.8 C)  TempSrc: Oral Oral Oral Oral  SpO2: 97% 98% 99% 98%  Weight:      Height:        Intake/Output Summary (Last 24 hours) at 03/07/2023 1031 Last data filed at 03/07/2023 0640 Gross per 24 hour  Intake 940 ml  Output 75 ml  Net 865 ml   Wt Readings from Last 3 Encounters:  03/04/23 53.6 kg  02/26/23 50 kg  02/13/23 54.8 kg    Examination:  Constitutional: NAD Eyes: lids and conjunctivae normal, no scleral icterus ENMT: mmm Neck: normal, supple Respiratory: clear to auscultation bilaterally, no wheezing, no crackles. Normal respiratory effort.  Cardiovascular: Regular rate and rhythm, no murmurs / rubs / gallops. No LE edema. Abdomen: soft, no distention, no tenderness. Bowel sounds positive.   Data Reviewed: I have independently reviewed following labs and imaging studies   CBC Recent Labs  Lab 03/03/23 1558 03/03/23 2252 03/05/23 0535 03/06/23 1106 03/07/23 0523  WBC  5.9  --  5.0 4.8 5.7  HGB 12.1 10.2* 10.0* 10.2* 9.7*  HCT 37.6 30.0* 31.0* 32.6* 31.7*  PLT 356  --  309 300 292  MCV 86.6  --  86.8 90.3 92.4  MCH 27.9  --  28.0 28.3 28.3  MCHC 32.2  --  32.3 31.3 30.6  RDW 14.6  --  14.7 14.9 14.8    Recent Labs  Lab 03/03/23 1558 03/03/23 2252 03/04/23 1112 03/05/23 0535 03/06/23 1106 03/07/23 0523  NA 135 140 137 140 138 138  K 4.1 3.9 3.2* 3.1* 3.2* 3.7  CL 111  --  109 109 115* 121*  CO2 11*  --  21* 23 16* 12*  GLUCOSE 161*  --  209* 233* 141* 159*  BUN 24*  --  21 18 15 11   CREATININE 1.64*  --  0.90 0.72 0.62 0.73  CALCIUM 9.1  --  8.3* 8.6* 8.3* 8.4*  AST 30  --   --   --  24 22  ALT 22  --   --   --  18 16  ALKPHOS 53  --   --   --  41 40  BILITOT 0.6  --   --   --  0.5 0.5  ALBUMIN 3.4*  --   --   --  2.6* 2.4*  MG  --   --   --   --  1.3* 1.4*    ------------------------------------------------------------------------------------------------------------------ No results for input(s): CHOL, HDL, LDLCALC, TRIG, CHOLHDL, LDLDIRECT in the last 72 hours.  Lab Results  Component Value Date   HGBA1C 7.7 (H) 02/15/2023   ------------------------------------------------------------------------------------------------------------------ No results for input(s): TSH, T4TOTAL, T3FREE, THYROIDAB in the last 72 hours.  Invalid input(s): FREET3  Cardiac Enzymes No results for input(s): CKMB, TROPONINI, MYOGLOBIN in the last 168 hours.  Invalid input(s): CK ------------------------------------------------------------------------------------------------------------------ No results found for: BNP  CBG: Recent Labs  Lab 03/06/23 0751 03/06/23 1123 03/06/23 1634 03/06/23 2042 03/07/23 0736  GLUCAP 165* 107* 105* 85 175*    Recent Results (from the past 240 hours)  Gastrointestinal Panel by PCR , Stool     Status: None   Collection Time: 03/03/23 10:16 PM   Specimen: Stool  Result Value Ref  Range Status   Campylobacter species NOT DETECTED NOT DETECTED Final   Plesimonas shigelloides NOT DETECTED NOT DETECTED Final   Salmonella species NOT DETECTED NOT DETECTED Final   Yersinia enterocolitica NOT DETECTED NOT DETECTED Final   Vibrio species NOT DETECTED NOT DETECTED Final   Vibrio cholerae NOT DETECTED NOT DETECTED Final   Enteroaggregative E coli (EAEC) NOT DETECTED NOT DETECTED Final   Enteropathogenic E coli (EPEC) NOT DETECTED NOT DETECTED Final   Enterotoxigenic E coli (ETEC) NOT DETECTED NOT DETECTED Final   Shiga like toxin producing E coli (STEC) NOT DETECTED NOT DETECTED Final   Shigella/Enteroinvasive E coli (EIEC) NOT DETECTED NOT DETECTED Final   Cryptosporidium NOT DETECTED NOT DETECTED Final   Cyclospora cayetanensis NOT DETECTED NOT DETECTED Final   Entamoeba histolytica NOT DETECTED NOT DETECTED Final   Giardia lamblia NOT DETECTED NOT DETECTED Final   Adenovirus F40/41 NOT DETECTED NOT DETECTED Final   Astrovirus NOT DETECTED NOT DETECTED Final   Norovirus GI/GII NOT DETECTED NOT DETECTED Final   Rotavirus A NOT DETECTED NOT DETECTED Final   Sapovirus (I, II, IV, and V) NOT DETECTED NOT DETECTED Final    Comment: Performed at [facility], [address] [place], [place] [address]  C Difficile Quick Screen w PCR reflex     Status: None   Collection Time: 03/03/23 10:16 PM   Specimen: Stool  Result Value Ref Range Status   C Diff antigen NEGATIVE NEGATIVE Final   C Diff toxin NEGATIVE NEGATIVE Final   C Diff interpretation No C. difficile detected.  Final    Comment: Performed at [facility], [address] [place], [place] [address]     Radiology Studies: No results found.   Nilda Fendt, MD, PhD [facility]  Between 7 am - 7 pm I am available, please contact me via Amion (for emergencies) or Securechat (non urgent messages)  Between 7 pm - 7 am I am not available, please contact night coverage MD/APP via Amion

## 2023-03-07 NOTE — Plan of Care (Signed)
   Problem: Education: Goal: Knowledge of General Education information will improve Description: Including pain rating scale, medication(s)/side effects and non-pharmacologic comfort measures Outcome: Progressing   Problem: Health Behavior/Discharge Planning: Goal: Ability to manage health-related needs will improve Outcome: Progressing   Problem: Clinical Measurements: Goal: Cardiovascular complication will be avoided Outcome: Progressing

## 2023-03-07 NOTE — Progress Notes (Signed)
Pt home meds from previous admission returned to pt.  Daughter took medications home.

## 2023-03-07 NOTE — Progress Notes (Addendum)
    Progress Note   Subjective  Chief Complaint: Chronic diarrhea  Unfortunately today patient continues with the same symptoms.  She has been recording her bathroom trips over the past 24 hours with a total of 18 from 730 yesterday morning until this morning, up every hour overnight with liquid stools.  Still no abdominal pain.  Somewhat discouraged today.   Objective   Vital signs in last 24 hours: Temp:  [98.2 F (36.8 C)-98.6 F (37 C)] 98.2 F (36.8 C) (02/04 0439) Pulse Rate:  [78-83] 78 (02/04 0439) Resp:  [16-17] 17 (02/04 0439) BP: (126-135)/(63-71) 126/63 (02/04 0439) SpO2:  [98 %-99 %] 98 % (02/04 0439) Last BM Date : 03/06/23 General:    Elderly white female in NAD Heart:  Regular rate and rhythm; no murmurs Lungs: Respirations even and unlabored, lungs CTA bilaterally Abdomen:  Soft, nontender and nondistended. Normal bowel sounds. Psych:  Cooperative. Normal mood and affect.  Intake/Output from previous day: 02/03 0701 - 02/04 0700 In: 1280 [P.O.:1280] Out: 125 [Urine:125]   Lab Results: Recent Labs    03/05/23 0535 03/06/23 1106 03/07/23 0523  WBC 5.0 4.8 5.7  HGB 10.0* 10.2* 9.7*  HCT 31.0* 32.6* 31.7*  PLT 309 300 292   BMET Recent Labs    03/05/23 0535 03/06/23 1106 03/07/23 0523  NA 140 138 138  K 3.1* 3.2* 3.7  CL 109 115* 121*  CO2 23 16* 12*  GLUCOSE 233* 141* 159*  BUN 18 15 11   CREATININE 0.72 0.62 0.73  CALCIUM 8.6* 8.3* 8.4*   LFT Recent Labs    03/07/23 0523  PROT 4.2*  ALBUMIN 2.4*  AST 22  ALT 16  ALKPHOS 40  BILITOT 0.5    Assessment / Plan:   Assessment: 1.  Severe chronic diarrheal illness: Extensive workup today with multiple empiric therapies which have been unrevealing/unhelpful, some labs are still pending, continues with 18 bowel movements in a 24-hour time, Gastrin level normal 2.  Hypokalemia: Corrected as of today  Plan: 1.  Continue empiric Flagyl  2.  Await VIP level (told this could take 72 hours to  result-submitted 03/06/23) and 24-hour urine for 5-HIAA (24-hour urine collection completed the morning of 03/06/2023) 3.  See Dr. Darilyn addendum from yesterday for further discussion on possible causes  Thank you for your kind consultation, we will continue to follow.   LOS: 3 days   [name]  03/07/2023, 10:00 AM      [facility] Attending   I have taken an interval history, reviewed the chart and examined the patient. I agree with the Advanced Practitioner's note, impression and recommendations with the following additions:  1) Add 4th dose lomotil   2) Considering repeat EGD and duodenal bxs since prior was abnormal though not specific - leave off olmesartan   3) await pending tests   [name], MD, [facility] See [name] on call - gastroenterology for best contact person 03/07/2023 2:51 PM

## 2023-03-08 ENCOUNTER — Other Ambulatory Visit: Payer: Self-pay | Admitting: Internal Medicine

## 2023-03-08 DIAGNOSIS — R197 Diarrhea, unspecified: Secondary | ICD-10-CM | POA: Diagnosis not present

## 2023-03-08 DIAGNOSIS — E785 Hyperlipidemia, unspecified: Secondary | ICD-10-CM | POA: Insufficient documentation

## 2023-03-08 DIAGNOSIS — K529 Noninfective gastroenteritis and colitis, unspecified: Secondary | ICD-10-CM | POA: Diagnosis not present

## 2023-03-08 DIAGNOSIS — I480 Paroxysmal atrial fibrillation: Secondary | ICD-10-CM | POA: Insufficient documentation

## 2023-03-08 LAB — COMPREHENSIVE METABOLIC PANEL
ALT: 19 U/L (ref 0–44)
AST: 26 U/L (ref 15–41)
Albumin: 2.8 g/dL — ABNORMAL LOW (ref 3.5–5.0)
Alkaline Phosphatase: 48 U/L (ref 38–126)
Anion gap: 6 (ref 5–15)
BUN: 8 mg/dL (ref 8–23)
CO2: 12 mmol/L — ABNORMAL LOW (ref 22–32)
Calcium: 8.5 mg/dL — ABNORMAL LOW (ref 8.9–10.3)
Chloride: 119 mmol/L — ABNORMAL HIGH (ref 98–111)
Creatinine, Ser: 0.75 mg/dL (ref 0.44–1.00)
GFR, Estimated: >60 mL/min (ref >60)
Glucose, Bld: 149 mg/dL — ABNORMAL HIGH (ref 70–99)
Potassium: 3.5 mmol/L (ref 3.5–5.1)
Sodium: 137 mmol/L (ref 135–145)
Total Bilirubin: 0.5 mg/dL (ref 0.0–1.2)
Total Protein: 5.2 g/dL — ABNORMAL LOW (ref 6.5–8.1)

## 2023-03-08 LAB — CBC
HCT: 35.7 % — ABNORMAL LOW (ref 36.0–46.0)
Hemoglobin: 11 g/dL — ABNORMAL LOW (ref 12.0–15.0)
MCH: 27.8 pg (ref 26.0–34.0)
MCHC: 30.8 g/dL (ref 30.0–36.0)
MCV: 90.2 fL (ref 80.0–100.0)
Platelets: 307 10*3/uL (ref 150–400)
RBC: 3.96 MIL/uL (ref 3.87–5.11)
RDW: 14.8 % (ref 11.5–15.5)
WBC: 5.8 10*3/uL (ref 4.0–10.5)
nRBC: 0 % (ref 0.0–0.2)

## 2023-03-08 LAB — MAGNESIUM: Magnesium: 1.9 mg/dL (ref 1.7–2.4)

## 2023-03-08 LAB — PHOSPHORUS: Phosphorus: 2.9 mg/dL (ref 2.5–4.6)

## 2023-03-08 LAB — GLUCOSE, CAPILLARY
Glucose-Capillary: 140 mg/dL — ABNORMAL HIGH (ref 70–99)
Glucose-Capillary: 199 mg/dL — ABNORMAL HIGH (ref 70–99)
Glucose-Capillary: 169 mg/dL — ABNORMAL HIGH (ref 70–99)
Glucose-Capillary: 91 mg/dL (ref 70–99)
Glucose-Capillary: 124 mg/dL — ABNORMAL HIGH (ref 70–99)

## 2023-03-08 NOTE — Assessment & Plan Note (Signed)
-  Continue flecainide , diltiazem  - Continue Xarelto

## 2023-03-08 NOTE — Assessment & Plan Note (Signed)
 Continue simvastatin.

## 2023-03-08 NOTE — Progress Notes (Signed)
  Progress Note   Patient: Donna Wiley FMW:[id number] DOB: October 18, 1940 DOA: 03/03/2023     4 DOS: the patient was seen and examined on 03/08/2023 at 10:02AM      Brief hospital course: 83 y.o. F with T1DM on insulin  pump, pAF on Xarelto  and iron deficiency anemia who presented with persistent diarrhea.      Assessment and Plan: Diarrhea 17 reported bowel movements in the last 24 hours.  Appreciate GI expertise - Follow VIP, 24-hour 5 HIAA, microsporidia, fecal calprotectin - Nursing staff to quantify stool output -Continue cholestyramine , Lomotil , empiric Flagyl     Type 1 diabetes Had severe hypoglycemia earlier in this hospitalization - Continue low-dose glargine - Continue sliding scale corrections  Paroxysmal atrial fibrillation -Continue flecainide , diltiazem  - Continue Xarelto   Hyperlipidemia - Continue simvastatin   Iron deficiency anemia Hemoglobin stable        Subjective: Patient still has frequent bowel movements.  She has had no blood in the stools, she has had no fever, she has had no abdominal cramps.     Physical Exam: BP (!) 151/79   Pulse (!) 110   Temp 98.3 F (36.8 C) (Oral)   Resp 20   Ht 5' 2 (1.575 m)   Wt 53.6 kg   SpO2 (!) 82%   BMI 21.61 kg/m   Thin adult female, lying in bed, interactive and appropriate RRR, no murmurs, no peripheral edema Respiratory normal, lungs clear without rales or wheezes Abdomen soft no tenderness palpation or guarding, no ascites or distention Attention normal, affect normal, judgment and insight appear normal  Data Reviewed: Chloride is elevated Sodium, potassium, and creatinine normal LFTs normal Mag and Phos normal CBC shows mild anemia  Family Communication: None present    Disposition: Status is: Inpatient         Author: Lonni SHAUNNA Dalton, MD 03/08/2023 5:46 PM  For on call review [email or web address].

## 2023-03-08 NOTE — Assessment & Plan Note (Signed)
17 reported bowel movements in the last 24 hours.  Appreciate GI expertise - Follow VIP, 24-hour 5 HIAA, microsporidia, fecal calprotectin - Nursing staff to quantify stool output -Continue cholestyramine, Lomotil, empiric Flagyl

## 2023-03-08 NOTE — Assessment & Plan Note (Signed)
Had severe hypoglycemia earlier in this hospitalization - Continue low-dose glargine - Continue sliding scale corrections

## 2023-03-08 NOTE — Hospital Course (Signed)
 83 y.o. F with T1DM on insulin pump, pAF on Xarelto and iron deficiency anemia who presented with persistent diarrhea.

## 2023-03-08 NOTE — Progress Notes (Addendum)
 Progress Note   Subjective  Chief Complaint: Chronic diarrhea  No change in symptoms overnight.  17 total stools over the past 24 hours.   Objective   Vital signs in last 24 hours: Temp:  [97.9 F (36.6 C)-98.2 F (36.8 C)] 97.9 F (36.6 C) (02/05 0516) Pulse Rate:  [70-75] 70 (02/05 0516) Resp:  [14-18] 14 (02/05 0516) BP: (132-151)/(66-71) 132/66 (02/05 0516) SpO2:  [98 %-100 %] 98 % (02/05 0516) Last BM Date : 03/08/23 General:   Elderly white female in NAD Heart:  Regular rate and rhythm; no murmurs Lungs: Respirations even and unlabored, lungs CTA bilaterally Abdomen:  Soft, nontender and nondistended. Normal bowel sounds. Psych:  Cooperative. Normal mood and affect.  Intake/Output from previous day: 02/04 0701 - 02/05 0700 In: 1850.1 [P.O.:1740; IV Piggyback:110.1] Out: -   Lab Results: Recent Labs    03/06/23 1106 03/07/23 0523 03/08/23 0535  WBC 4.8 5.7 5.8  HGB 10.2* 9.7* 11.0*  HCT 32.6* 31.7* 35.7*  PLT 300 292 307   BMET Recent Labs    03/06/23 1106 03/07/23 0523 03/08/23 0535  NA 138 138 137  K 3.2* 3.7 3.5  CL 115* 121* 119*  CO2 16* 12* 12*  GLUCOSE 141* 159* 149*  BUN 15 11 8   CREATININE 0.62 0.73 0.75  CALCIUM 8.3* 8.4* 8.5*   LFT Recent Labs    03/08/23 0535  PROT 5.2*  ALBUMIN 2.8*  AST 26  ALT 19  ALKPHOS 48  BILITOT 0.5    Assessment / Plan:   Assessment: 1.  Severe chronic diarrheal illness: Extensive workup to date with multiple empiric therapies which been unrevealing/unhelpful, some labs still pending, continues with a 17 bowel movements in a 24-hour timeframe  Plan: 1.  Continue empiric Flagyl  2.  Await VIP level (still waiting on test tube at the moment), 24-hour urine also still pending 3.  Fourth dose of Lomotil  added on 2//25, no change in symptoms yet 4.  Again consider repeating EGD with duodenal biopsies since prior was abnormal though not specific and leave off on losartan  Thank you for your kind  consultation, we will continue to follow.    LOS: 4 days   Donna Wiley  03/08/2023, 9:32 AM  Gastroenterology attending:  I have seen and evaluated the patient as well.  Here are my thoughts today.  Stool calprotectin has been elevated in December and January-270 04 February 2012 and 297 December 10.  We have never acted on this with empiric steroids.  Colon biopsies were negative for microscopic colitis or other colitis.  She did have a duodenitis of unclear etiology though the duodenum looked normal, and there were changes suggestive of inflammation in the small bowel though mild and limited, on small bowel capsule endoscopy.  This raises the idea that she actually has an inflammatory or infectious process.  I am rechecking stool calprotectin.  I have also checked for microsporidia in the stool.  Up-to-date recommends checking for this in people with persistent diarrhea of unclear etiology.  It can occur in immunocompetent patients.  It can be difficult to treat.  She did have an abnormal fecal elastase of 60 but has a watery diarrhea and did not respond to Creon .  There were increased fat droplets in the qualitative fat analysis as well.  That would suggest fat malabsorption but that does not necessarily mean it is from pancreatic insufficiency.  An inflammatory process can cause this.  Diabetic diarrhea from autonomic neuropathy  seems unlikely due to lack of other autonomic problems/neuropathy and that should not cause an elevated calprotectin, either.  Depending upon test results I think we may need to consider an empiric trial of steroids.  This will raise her blood sugars.  We discussed, and decided to wait until tomorrow to see what results come back.  Another empiric option would be octreotide.  This can be challenging in terms of outpatient pharmacy coverage.  I had been wondering about olmesartan as the cause of her diarrhea though I am less enthusiastic about that as a cause  as time goes on and also less enthusiastic about a repeat EGD though still keeping that in mind.  I have also asked the nursing staff to quantify her stool output.  Pending studies are the VIP level which looks like it is in process, 24-hour urine 5 HIAA, microsporidia in the stool, repeat calprotectin.  Donna CHARLENA Commander, MD, [facility]  Gastroenterology See Donna Wiley on call - gastroenterology for best contact person 03/08/2023 3:00 PM

## 2023-03-09 DIAGNOSIS — K529 Noninfective gastroenteritis and colitis, unspecified: Secondary | ICD-10-CM | POA: Diagnosis not present

## 2023-03-09 DIAGNOSIS — R197 Diarrhea, unspecified: Secondary | ICD-10-CM | POA: Diagnosis not present

## 2023-03-09 LAB — COMPREHENSIVE METABOLIC PANEL
ALT: 25 U/L (ref 0–44)
AST: 47 U/L — ABNORMAL HIGH (ref 15–41)
Albumin: 2.6 g/dL — ABNORMAL LOW (ref 3.5–5.0)
Alkaline Phosphatase: 126 U/L (ref 38–126)
Anion gap: 9 (ref 5–15)
BUN: 7 mg/dL — ABNORMAL LOW (ref 8–23)
CO2: 13 mmol/L — ABNORMAL LOW (ref 22–32)
Calcium: 8.3 mg/dL — ABNORMAL LOW (ref 8.9–10.3)
Chloride: 113 mmol/L — ABNORMAL HIGH (ref 98–111)
Creatinine, Ser: 0.88 mg/dL (ref 0.44–1.00)
GFR, Estimated: >60 mL/min (ref >60)
Glucose, Bld: 200 mg/dL — ABNORMAL HIGH (ref 70–99)
Potassium: 3.3 mmol/L — ABNORMAL LOW (ref 3.5–5.1)
Sodium: 135 mmol/L (ref 135–145)
Total Bilirubin: 0.5 mg/dL (ref 0.0–1.2)
Total Protein: 4.5 g/dL — ABNORMAL LOW (ref 6.5–8.1)

## 2023-03-09 LAB — CBC
HCT: 32.2 % — ABNORMAL LOW (ref 36.0–46.0)
Hemoglobin: 10 g/dL — ABNORMAL LOW (ref 12.0–15.0)
MCH: 27.9 pg (ref 26.0–34.0)
MCHC: 31.1 g/dL (ref 30.0–36.0)
MCV: 89.7 fL (ref 80.0–100.0)
Platelets: 301 10*3/uL (ref 150–400)
RBC: 3.59 MIL/uL — ABNORMAL LOW (ref 3.87–5.11)
RDW: 15 % (ref 11.5–15.5)
WBC: 4.7 10*3/uL (ref 4.0–10.5)
nRBC: 0 % (ref 0.0–0.2)

## 2023-03-09 LAB — 5 HIAA, QUANTITATIVE, URINE, 24 HOUR
5-HIAA, Ur: 3.2 mg/L
5-HIAA,Quant.,24 Hr Urine: 0.7 mg/(24.h) (ref 0.0–14.9)
Total Volume: 210

## 2023-03-09 LAB — GLUCOSE, CAPILLARY
Glucose-Capillary: 176 mg/dL — ABNORMAL HIGH (ref 70–99)
Glucose-Capillary: 196 mg/dL — ABNORMAL HIGH (ref 70–99)
Glucose-Capillary: 181 mg/dL — ABNORMAL HIGH (ref 70–99)
Glucose-Capillary: 140 mg/dL — ABNORMAL HIGH (ref 70–99)

## 2023-03-09 MED ORDER — CHOLESTYRAMINE LIGHT 4 G PO PACK
4.0000 g | PACK | Freq: Two times a day (BID) | ORAL | Status: DC
  Administered 2023-03-09 – 2023-03-10 (×2): 4 g via ORAL
  Filled 2023-03-09 (×2): qty 1

## 2023-03-09 MED ORDER — POTASSIUM CHLORIDE CRYS ER 20 MEQ PO TBCR
40.0000 meq | EXTENDED_RELEASE_TABLET | Freq: Once | ORAL | Status: AC
  Administered 2023-03-09: 40 meq via ORAL
  Filled 2023-03-09: qty 2

## 2023-03-09 NOTE — Progress Notes (Signed)
  Progress Note   Patient: Donna Wiley FMW:[id number] DOB: 1940-02-12 DOA: 03/03/2023     5 DOS: the patient was seen and examined on 03/09/2023 at 9:45 AM      Brief hospital course: 83 y.o. F with T1DM on insulin  pump, pAF on Xarelto  and iron deficiency anemia who presented with persistent diarrhea.      Assessment and Plan: * Subacute diarrhea Bowel movements somewhat fewer in the last 24 hours, more mushy less liquid.  24-hour 5 HIAA unremarkable - Follow VIP, microsporidia, fecal calprotectin - Olmesartan stopped -Continue Prevalite , Lomotil , empiric Flagyl   Dyslipidemia - Continue simvastatin   Paroxysmal atrial fibrillation (HCC) -Continue flecainide , diltiazem  - Continue Xarelto   Hypertension Blood pressure controlled -Hold olmesartan  Hypokalemia - Supplement potassium  Type 1 diabetes mellitus (HCC) Glucose okay - Continue glargine and sliding scale corrections          Subjective: No change, may be slightly better, slightly      Physical Exam: BP 120/67 (BP Location: Left Arm)   Pulse 72   Temp 98.6 F (37 C) (Oral)   Resp 16   Ht 5' 2 (1.575 m)   Wt 53.6 kg   SpO2 99%   BMI 21.61 kg/m   Elderly adult female, sitting up in bed, interactive and appropriate RRR, no murmurs, no peripheral edema Respiratory normal, lungs clear without rales or wheezes Abdomen soft nontender palpation or guarding, no ascites or distention  Data Reviewed: Comprehensive metabolic panel unremarkable CBC stable mild anemia AST 47  Family Communication: None present    Disposition: Status is: Inpatient         Author: Lonni SHAUNNA Dalton, MD 03/09/2023 5:08 PM  For on call review [email or web address].

## 2023-03-09 NOTE — Progress Notes (Signed)
 Mobility Specialist - Progress Note   03/09/23 1335  Mobility  Activity Ambulated independently in hallway  Level of Assistance Independent  Assistive Device None  Distance Ambulated (ft) 500 ft  Activity Response Tolerated well  Mobility Referral Yes  Mobility visit 1 Mobility  Mobility Specialist Start Time (ACUTE ONLY) 1325  Mobility Specialist Stop Time (ACUTE ONLY) 1335  Mobility Specialist Time Calculation (min) (ACUTE ONLY) 10 min   Pt received in bed and agreeable to mobility. No complaints during session. Pt to bed after session with all needs met.   [facility]

## 2023-03-09 NOTE — Progress Notes (Addendum)
 Progress Note   Subjective  Hospital day #5 Chief Complaint: Chronic Diarrhea  This morning patient has actually had improvement overnight, she only had 7 bowel movements over the past 24 hours which is actually an improvement to the 17/18 she had the previous days.  She was happy that she actually got to sleep overnight.  This is the first day that she has felt any improvement.  Per nursing staff her VIP test was collected yesterday.  They are not sure how long it takes to come back.  Called [facility] in regards to 5 HIAA urine test and they say it may take 4 to 8 days to result, he says to check back tomorrow.   Objective   Vital signs in last 24 hours: Temp:  [98 F (36.7 C)-98.3 F (36.8 C)] 98.1 F (36.7 C) (02/06 0503) Pulse Rate:  [67-110] 67 (02/06 0503) Resp:  [16-20] 16 (02/06 0503) BP: (117-151)/(59-79) 122/59 (02/06 0503) SpO2:  [82 %-100 %] 99 % (02/06 0503) Last BM Date : 03/09/23 General:    Elderly white female in NAD Heart:  Regular rate and rhythm; no murmurs Lungs: Respirations even and unlabored, lungs CTA bilaterally Abdomen:  Soft, nontender and nondistended. Increased BS all four quadrants Psych:  Cooperative. Normal mood and affect.  Intake/Output from previous day: 02/05 0701 - 02/06 0700 In: -  Out: 1350 [Stool:1350]  Lab Results: Recent Labs    03/07/23 0523 03/08/23 0535 03/09/23 0608  WBC 5.7 5.8 4.7  HGB 9.7* 11.0* 10.0*  HCT 31.7* 35.7* 32.2*  PLT 292 307 301   BMET Recent Labs    03/07/23 0523 03/08/23 0535 03/09/23 0608  NA 138 137 135  K 3.7 3.5 3.3*  CL 121* 119* 113*  CO2 12* 12* 13*  GLUCOSE 159* 149* 200*  BUN 11 8 7*  CREATININE 0.73 0.75 0.88  CALCIUM 8.4* 8.5* 8.3*   LFT Recent Labs    03/09/23 0608  PROT 4.5*  ALBUMIN 2.6*  AST 47*  ALT 25  ALKPHOS 126  BILITOT 0.5    Assessment / Plan:   Assessment: 1.  Severe chronic diarrheal illness: Extensive workup to date with multiple tests and therapies tried  empirically, overnight did have a slow and bowel movements to only 7 in the past 24-hour timeframe which was an improvement after addition of the fourth dose of Lomotil  on 03/07/23  Plan: 1.  Continue empiric Flagyl  2.  Await VIP level (sample drawn yesterday 03/08/2023 per nursing staff), await 5 HIAA urine results (called [facility], they say 4 to 8 days, apparently received yesterday), fecal calprotectin which was added yesterday as well as microsporidia are also still in process 3.  Continue current medications, it seems diarrhea has slowed overnight, hopefully this remains the case 4.  Please see Dr. Darilyn in-depth addendum to my note yesterday in regards to his additional thoughts. 5.  Will call [facility] again tomorrow [phone], acount #[phone] to check on results.  Thank you for your kind consultation, we will continue to follow.   LOS: 5 days   [name]  03/09/2023, 10:02 AM      [facility] Attending   I have taken an interval history, reviewed the chart and examined the patient. I agree with the Advanced Practitioner's note, impression and recommendations with the following additions:  1350 cc stool out yesterday (Started recording after day began)  She is improving - 5 stools so far today 97 yesterday and none overnight)  Mushy  not watery -       Media Information   Document Information  Photos    03/09/2023 14:36  Attached To:  Hospital Encounter on 03/03/23  Source Information  Avram [name] BRAVO, MD  [facility]  Document History          Given the improvement will stay w/ current tx  So perhaps it is cessation of olmesartan or treatment w/ metronidazole  that is having effect?  Await pending studies.  Will f/u tomorrow.  [name] [name], MD, [facility] Gastroenterology See [name] on call - gastroenterology for best contact person 03/09/2023 3:30 PM

## 2023-03-09 NOTE — Plan of Care (Signed)

## 2023-03-09 NOTE — Plan of Care (Signed)
   Problem: Clinical Measurements: Goal: Will remain free from infection Outcome: Progressing   Problem: Clinical Measurements: Goal: Diagnostic test results will improve Outcome: Progressing   Problem: Clinical Measurements: Goal: Respiratory complications will improve Outcome: Progressing   Problem: Clinical Measurements: Goal: Cardiovascular complication will be avoided Outcome: Progressing   Problem: Activity: Goal: Risk for activity intolerance will decrease Outcome: Progressing

## 2023-03-10 DIAGNOSIS — K529 Noninfective gastroenteritis and colitis, unspecified: Secondary | ICD-10-CM | POA: Diagnosis not present

## 2023-03-10 LAB — GLUCOSE, CAPILLARY
Glucose-Capillary: 230 mg/dL — ABNORMAL HIGH (ref 70–99)
Glucose-Capillary: 257 mg/dL — ABNORMAL HIGH (ref 70–99)
Glucose-Capillary: 265 mg/dL — ABNORMAL HIGH (ref 70–99)
Glucose-Capillary: 215 mg/dL — ABNORMAL HIGH (ref 70–99)

## 2023-03-10 LAB — CALPROTECTIN, FECAL: Calprotectin, Fecal: 264 ug/g — ABNORMAL HIGH (ref 0–120)

## 2023-03-10 MED ORDER — METRONIDAZOLE 500 MG PO TABS
250.0000 mg | ORAL_TABLET | Freq: Three times a day (TID) | ORAL | Status: AC
  Administered 2023-03-10 – 2023-03-14 (×12): 250 mg via ORAL
  Filled 2023-03-10 (×12): qty 1

## 2023-03-10 MED ORDER — METRONIDAZOLE 500 MG PO TABS: 250.0000 mg | ORAL_TABLET | Freq: Three times a day (TID) | ORAL | Status: DC

## 2023-03-10 MED ORDER — DIPHENOXYLATE-ATROPINE 2.5-0.025 MG PO TABS
2.0000 | ORAL_TABLET | Freq: Four times a day (QID) | ORAL | Status: DC
Start: 2023-03-10 — End: 2023-03-14
  Administered 2023-03-10 – 2023-03-14 (×16): 2 via ORAL
  Filled 2023-03-10 (×16): qty 2

## 2023-03-10 MED ORDER — CHOLESTYRAMINE LIGHT 4 G PO PACK: 4.0000 g | PACK | Freq: Two times a day (BID) | ORAL | Status: DC

## 2023-03-10 MED ORDER — CHOLESTYRAMINE LIGHT 4 G PO PACK
4.0000 g | PACK | Freq: Two times a day (BID) | ORAL | Status: DC
  Administered 2023-03-10 – 2023-03-14 (×8): 4 g via ORAL
  Filled 2023-03-10 (×9): qty 1

## 2023-03-10 MED ORDER — BUDESONIDE 3 MG PO CPEP
9.0000 mg | ORAL_CAPSULE | Freq: Every day | ORAL | Status: DC
  Administered 2023-03-10 – 2023-03-14 (×5): 9 mg via ORAL
  Filled 2023-03-10 (×5): qty 3

## 2023-03-10 NOTE — Progress Notes (Signed)
  Progress Note   Patient: Donna Wiley FMW:[id number] DOB: 03/05/1940 DOA: 03/03/2023     6 DOS: the patient was seen and examined on 03/10/2023 at 9:26 AM      Brief hospital course: 83 y.o. F with T1DM on insulin  pump, pAF on Xarelto  and iron deficiency anemia who presented with persistent diarrhea.      Assessment and Plan: * Subacute diarrhea Still frequent watery bowel movements -Continue cholestyramine , Lomotil  - Continue Flagyl  - Starting budesonide  - Appreciate gastroenterology expertise    Atrial fibrillation -Continue flecainide , diltiazem  - Continue Xarelto   Hypertension Blood pressure normal - Hold olmesartan    Diabetes Glucoses controlled - Continue low-dose glargine - Continue sliding scale corrections          Subjective: Patient reports no significant change.  No new abdominal pain or fever.     Physical Exam: BP 132/64   Pulse 72   Temp 98.3 F (36.8 C) (Oral)   Resp 16   Ht 5' 2 (1.575 m)   Wt 53.6 kg   SpO2 100%   BMI 21.61 kg/m   Thin adult female, sitting up in bed, interactive and appropriate RRR, no murmurs, no peripheral edema Respiratory rate normal, lungs clear without rales or wheezes Abdomen soft no tenderness palpation or guarding  Data Reviewed: Discussed with gastroenterology Glucose normal  Family Communication: None    Disposition: Status is: Inpatient         Author: Lonni SHAUNNA Dalton, MD 03/10/2023 1:13 PM  For on call review [email or web address].

## 2023-03-10 NOTE — Inpatient Diabetes Management (Signed)
 Inpatient Diabetes Program Recommendations  AACE/ADA: New Consensus Statement on Inpatient Glycemic Control (2015)  Target Ranges:  Prepandial:   less than 140 mg/dL      Peak postprandial:   less than 180 mg/dL (1-2 hours)      Critically ill patients:  140 - 180 mg/dL   Lab Results  Component Value Date   GLUCAP 230 (H) 03/10/2023   HGBA1C 7.7 (H) 02/15/2023    Review of Glycemic Control  Diabetes history: DM1 Outpatient Diabetes medications: OmniPod Dash insulin  pump Current orders for Inpatient glycemic control: Semglee  7 daily, Novolog  0-6 TID with meals and 0-5 HS  Basal rates: 12am- 0.4 unit 3am- 0.45 unit 7am- 0.55 unit 2pm- 0.3 unit 6pm- 0.5 unit Total basal per 24 hours= 11.05 units Carb Ratio is set for 3.3-10 (1 unit for every 3.3-10 grams Carbs depending on the time) Correction Factor is set for 35-40 (1 unit for every 35-40 mg/dl above target CBG depending on the time) Goal CBG 120-140 mg/dl  Inpatient Diabetes Program Recommendations:    If FBS continues above goal of 180 mg/dL, consider increasing Semglee  to 8 or 9 units daily.  Follow glucose trends.   Thank you. Shona Brandy, RD, LDN, CDCES Inpatient Diabetes Coordinator [phone]

## 2023-03-10 NOTE — Progress Notes (Signed)
 Mobility Specialist - Progress Note   03/10/23 1036  Mobility  Activity Ambulated independently in hallway  Level of Assistance Independent  Assistive Device None  Distance Ambulated (ft) 150 ft  Range of Motion/Exercises Active  Activity Response Tolerated well  Mobility Referral Yes  Mobility visit 1 Mobility  Mobility Specialist Start Time (ACUTE ONLY) 1026  Mobility Specialist Stop Time (ACUTE ONLY) 1036  Mobility Specialist Time Calculation (min) (ACUTE ONLY) 10 min   Pt was found in bed and agreeable to ambulate. No complaints with session. At EOS returned to room with all needs met. Call bell in reach.  Erminio Leos Mobility Specialist

## 2023-03-10 NOTE — Progress Notes (Signed)
   Patient Name: Donna Wiley Date of Encounter: 03/10/2023, 11:20 AM     Assessment and Plan  Chronic diarrhea - watery (mostly)  Cause unclear  Elevated fecal calprotectin x 3 now, no infection found. Urine 5HIAA, gastrin are negative. VIP pending. Micorosporidia test pending. Some duodenitis on EGD before and patchy small bowel inflammation - mild on capsule endoscopy (both in Jan)  She is improved but not resolved.   I think could be olmesartan though probably not, ? If metronidazole  helping an undiagnosed infection (parasite?)  I am going to add budesonide  while we wait for VIP and microsporidia test    Subjective  4 stools so far today, 7 yesterday, volume was less at 800 cc yesterday   Objective  BP 130/71 (BP Location: Right Arm)   Pulse 69   Temp 98.3 F (36.8 C) (Oral)   Resp 18   Ht 5' 2 (1.575 m)   Wt 53.6 kg   SpO2 99%   BMI 21.61 kg/m      Media Information   Document Information  Photos    03/10/2023 10:58  Attached To:  Hospital Encounter on 03/03/23  Source Information  Avram [name] BRAVO, MD  [facility]  Document History     Component Ref Range & Units (hover) 2 d ago 3 wk ago 1 mo ago  Calprotectin, Fecal 264 High  274 High  CM 297 High  CM  Comment: (NOTE)   Component Ref Range & Units (hover) 6 d ago  5-HIAA, Ur 3.2  Comment: (NOTE) This test was developed and its performance characteristics determined by [facility]. It has not been cleared or approved by the Food and Drug Administration.  5-HIAA,Quant.,24 Hr Urine 0.7  Comment: (NOTE) Performed At: [facility] [address] [place], [place] [id number] Jennette Shorter MD Ey:[id number]  Total Volume 210   [name] [name], MD, [facility] Gastroenterology See [name] on call - gastroenterology for best contact person 03/10/2023 11:20 AM

## 2023-03-10 NOTE — Plan of Care (Signed)

## 2023-03-10 NOTE — Plan of Care (Signed)
  Problem: Education: Goal: Knowledge of General Education information will improve Description: Including pain rating scale, medication(s)/side effects and non-pharmacologic comfort measures Outcome: Progressing   Problem: Health Behavior/Discharge Planning: Goal: Ability to manage health-related needs will improve Outcome: Progressing   Problem: Clinical Measurements: Goal: Ability to maintain clinical measurements within normal limits will improve Outcome: Progressing Goal: Will remain free from infection Outcome: Progressing Goal: Diagnostic test results will improve Outcome: Progressing Goal: Respiratory complications will improve Outcome: Progressing Goal: Cardiovascular complication will be avoided Outcome: Progressing   Problem: Activity: Goal: Risk for activity intolerance will decrease Outcome: Progressing   Problem: Coping: Goal: Level of anxiety will decrease Outcome: Progressing   Problem: Elimination: Goal: Will not experience complications related to urinary retention Outcome: Progressing   Problem: Pain Managment: Goal: General experience of comfort will improve and/or be controlled Outcome: Progressing   Problem: Safety: Goal: Ability to remain free from injury will improve Outcome: Progressing   Problem: Skin Integrity: Goal: Risk for impaired skin integrity will decrease Outcome: Progressing   Problem: Nutrition: Goal: Adequate nutrition will be maintained Outcome: Not Progressing   Problem: Elimination: Goal: Will not experience complications related to bowel motility Outcome: Not Progressing

## 2023-03-10 NOTE — Care Plan (Signed)
 8AM 10AM 11AM  3PM 5PM 6PM 10PM 11PM  Lomotil   X    X  X X  Flagyl   X    X   X Budesonide       X Xarelto        X Prevalite    X      X Diltiazem         X Flecainide   X      X Simvastatin         X Trazodone         X Ativan          X

## 2023-03-11 DIAGNOSIS — K529 Noninfective gastroenteritis and colitis, unspecified: Secondary | ICD-10-CM | POA: Diagnosis not present

## 2023-03-11 LAB — GLUCOSE, CAPILLARY
Glucose-Capillary: 305 mg/dL — ABNORMAL HIGH (ref 70–99)
Glucose-Capillary: 289 mg/dL — ABNORMAL HIGH (ref 70–99)
Glucose-Capillary: 287 mg/dL — ABNORMAL HIGH (ref 70–99)
Glucose-Capillary: 218 mg/dL — ABNORMAL HIGH (ref 70–99)

## 2023-03-11 LAB — CBC
HCT: 31.1 % — ABNORMAL LOW (ref 36.0–46.0)
Hemoglobin: 9.8 g/dL — ABNORMAL LOW (ref 12.0–15.0)
MCH: 27.7 pg (ref 26.0–34.0)
MCHC: 31.5 g/dL (ref 30.0–36.0)
MCV: 87.9 fL (ref 80.0–100.0)
Platelets: 302 10*3/uL (ref 150–400)
RBC: 3.54 MIL/uL — ABNORMAL LOW (ref 3.87–5.11)
RDW: 15.1 % (ref 11.5–15.5)
WBC: 5.2 10*3/uL (ref 4.0–10.5)
nRBC: 0 % (ref 0.0–0.2)

## 2023-03-11 LAB — COMPREHENSIVE METABOLIC PANEL
ALT: 26 U/L (ref 0–44)
AST: 32 U/L (ref 15–41)
Albumin: 2.4 g/dL — ABNORMAL LOW (ref 3.5–5.0)
Alkaline Phosphatase: 89 U/L (ref 38–126)
Anion gap: 8 (ref 5–15)
BUN: 8 mg/dL (ref 8–23)
CO2: 14 mmol/L — ABNORMAL LOW (ref 22–32)
Calcium: 8.5 mg/dL — ABNORMAL LOW (ref 8.9–10.3)
Chloride: 113 mmol/L — ABNORMAL HIGH (ref 98–111)
Creatinine, Ser: 0.84 mg/dL (ref 0.44–1.00)
GFR, Estimated: >60 mL/min (ref >60)
Glucose, Bld: 349 mg/dL — ABNORMAL HIGH (ref 70–99)
Potassium: 3.5 mmol/L (ref 3.5–5.1)
Sodium: 135 mmol/L (ref 135–145)
Total Bilirubin: 0.7 mg/dL (ref 0.0–1.2)
Total Protein: 4.5 g/dL — ABNORMAL LOW (ref 6.5–8.1)

## 2023-03-11 LAB — MAGNESIUM: Magnesium: 1.5 mg/dL — ABNORMAL LOW (ref 1.7–2.4)

## 2023-03-11 LAB — PHOSPHORUS: Phosphorus: 3 mg/dL (ref 2.5–4.6)

## 2023-03-11 MED ORDER — MAGNESIUM SULFATE 2 GM/50ML IV SOLN
2.0000 g | Freq: Once | INTRAVENOUS | Status: AC
  Administered 2023-03-11: 2 g via INTRAVENOUS
  Filled 2023-03-11: qty 50

## 2023-03-11 MED ORDER — SODIUM BICARBONATE 650 MG PO TABS
650.0000 mg | ORAL_TABLET | Freq: Two times a day (BID) | ORAL | Status: DC
  Administered 2023-03-11 – 2023-03-13 (×5): 650 mg via ORAL
  Filled 2023-03-11 (×5): qty 1

## 2023-03-11 MED ORDER — INSULIN ASPART 100 UNIT/ML IJ SOLN
7.0000 [IU] | Freq: Once | INTRAMUSCULAR | Status: AC
  Administered 2023-03-11: 7 [IU] via SUBCUTANEOUS

## 2023-03-11 MED ORDER — INSULIN ASPART 100 UNIT/ML IJ SOLN
0.0000 [IU] | Freq: Three times a day (TID) | INTRAMUSCULAR | Status: DC
  Administered 2023-03-11 – 2023-03-12 (×4): 8 [IU] via SUBCUTANEOUS
  Administered 2023-03-12 – 2023-03-13 (×2): 5 [IU] via SUBCUTANEOUS
  Administered 2023-03-13 (×2): 11 [IU] via SUBCUTANEOUS
  Administered 2023-03-14: 8 [IU] via SUBCUTANEOUS
  Administered 2023-03-14: 5 [IU] via SUBCUTANEOUS

## 2023-03-11 NOTE — Progress Notes (Addendum)
   Patient Name: Donna Wiley Date of Encounter: 03/11/2023, 10:10 AM     Assessment and Plan  Chronic diarrhea - watery (mostly)  Cause unclear  Elevated fecal calprotectin x 3 now, no infection found. Urine 5HIAA, gastrin are negative. VIP pending. Micorosporidia test pending. Some duodenitis on EGD before and very patchy small bowel inflammation - mild on capsule endoscopy - mildly edematous proximal small bowel mucosa, single faint erosion and possible subtle bowel wall edema in distal small bowel but mostly normal study(both in Jan). Abnormal fecal elastase thought to be dilutional, did have increased qualitative fat - is not worse and seems better since stopping Creon   She is improved but not resolved.   I think could be olmesartan though probably not, ? If metronidazole  (started 2/1) helping an undiagnosed infection (parasite?)  Budesonide  started 2/7 while we wait for VIP and microsporidia test  Dr. Jonel worked with pharmacy to time dose of cholestyramine  to reduce risk of malabsorbing other meds     Subjective  3 stools so far today, 7 yesterday, volume was less at 700 cc yesterday   Objective  BP 129/65 (BP Location: Right Arm)   Pulse 73   Temp 97.8 F (36.6 C) (Oral)   Resp 16   Ht 5' 2 (1.575 m)   Wt 53.6 kg   SpO2 98%   BMI 21.61 kg/m  Abd soft NT   glucose 305 this Am (after starting budesonide )    Component Ref Range & Units (hover) 2 d ago 3 wk ago 1 mo ago  Calprotectin, Fecal 264 High  274 High  CM 297 High  CM  Comment: (NOTE)   Component Ref Range & Units (hover) 6 d ago  5-HIAA, Ur 3.2  Comment: (NOTE) This test was developed and its performance characteristics determined by [facility]. It has not been cleared or approved by the Food and Drug Administration.  5-HIAA,Quant.,24 Hr Urine 0.7  Comment: (NOTE) Performed At: [facility] [address] [place], [place] [id number] Jennette Shorter MD Ey:[id number]  Total  Volume 210   [name], MD, [facility] Gastroenterology See [name] on call - gastroenterology for best contact person 03/11/2023 10:10 AM

## 2023-03-11 NOTE — Progress Notes (Signed)
  Progress Note   Patient: Donna Wiley FMW:[id number] DOB: [birth year] DOA: 03/03/2023     7 DOS: the patient was seen and examined on 03/11/2023 at 10:30AM      Brief hospital course: See prior     Assessment and Plan: * Subacute diarrhea Report no significant change today - Follow up VIP, microsporidia - Continue Flagyl , budesonide , PRevalite , Lomotil      Diabetes Hyperglycemic since starting Entocort Had hypoglycemia to ~15 mg/dL earlier this hospital stay, will err on side of riding high. - Continue glargine - Continue SS corrections, increase dose    Paroxysmal atrial fibrillation (HCC) -Continue flecainide , diltiazem  - Continue Xarelto    Hypomagnesemia Non gap acute metabolic acidosis  Due to diarrhea - Supp mag and bicarb        Subjective: No change     Physical Exam: BP 135/60 (BP Location: Right Arm)   Pulse 79   Temp 98.4 F (36.9 C) (Oral)   Resp 18   Ht 5' 2 (1.575 m)   Wt 53.6 kg   SpO2 100%   BMI 21.61 kg/m   Adult female, interactive and appropriate RRR no murmurs, no peripheral edema RR normal, lungs clear Abdomen soft and benign without TTP Attention normal, affect pleasant, judgment and insight normal.  Data Reviewed: CO2 low Mag low Hgb stable  Family Communication: None    Disposition: Status is: Inpatient         Author: Lonni SHAUNNA Dalton, MD 03/11/2023 3:39 PM  For on call review [email or web address].

## 2023-03-12 DIAGNOSIS — K529 Noninfective gastroenteritis and colitis, unspecified: Secondary | ICD-10-CM | POA: Diagnosis not present

## 2023-03-12 LAB — GLUCOSE, CAPILLARY
Glucose-Capillary: 292 mg/dL — ABNORMAL HIGH (ref 70–99)
Glucose-Capillary: 239 mg/dL — ABNORMAL HIGH (ref 70–99)
Glucose-Capillary: 298 mg/dL — ABNORMAL HIGH (ref 70–99)
Glucose-Capillary: 347 mg/dL — ABNORMAL HIGH (ref 70–99)

## 2023-03-12 NOTE — Progress Notes (Addendum)
   Patient Name: Donna Wiley Date of Encounter: 03/12/2023, 11:35 AM     Assessment and Plan  Chronic diarrhea - watery (mostly)  Cause unclear  Elevated fecal calprotectin x 3 now, no infection found. Urine 5HIAA, gastrin are negative. VIP pending. Micorosporidia test pending. Some duodenitis on EGD before and very patchy small bowel inflammation - mild on capsule endoscopy - mildly edematous proximal small bowel mucosa, single faint erosion and possible subtle bowel wall edema in distal small bowel but mostly normal study(both in Jan). Abnormal fecal elastase thought to be dilutional, did have increased qualitative fat - is not worse and seems better since stopping Creon  - 12/6 CT w/ IV contrast - moderate pancreatic atrophy?? Significance - I think she may have excess fat in stool but have doubted pancreatic insufficiency as a cause based upon clinical course and lack of response - will recheck qualitative fat   Abnormal CT abd/pelvis w/ contrast 12/6 ([facility])  mod central mesenteric swirling w/ some small nodes - progressive from 2017 - I learned about this today when ?ing her about IV contrast CT - as state there is a broad differential - this has not been described in unenhanced scans here but those are less sensitive.   On Lomotil , metronidazole , cholestyramine , budesonide   ----------------------------------------------------------------------------------------------------------------------------------- She is improved but not resolved.   I think could be olmesartan though probably, ? If metronidazole  (started 2/1) helping an undiagnosed infection (parasite?)  Budesonide  started 2/7 (this is day3) while we wait for VIP and microsporidia test. Glucose levels have increased.  Dr. Jonel worked with pharmacy to time dose of cholestyramine  to reduce risk of malabsorbing other meds   We discussed dc planning - she prefers a definitive dx but we cannot promise that. Would aim  for ability to keep up with stool losses on po diet/fluids.      Subjective  3 stools so far since MN, 8 yesterday, volume was less 250 cc yesterday but ? If after MN counted She wants an answer - dx - so far we do not have one   Objective  BP (!) 148/62 (BP Location: Right Arm)   Pulse 75   Temp (!) 97.5 F (36.4 C) (Oral)   Resp 18   Ht 5' 2 (1.575 m)   Wt 53.6 kg   SpO2 98%   BMI 21.61 kg/m  Abd soft NT    CT abd/pelvis w/ IV contrast - [facility] Everywhere 01/06/23 - steroid prep given IMPRESSION:  Moderate central mesenteric stranding with some small nodes.  Progressive from older examinations. No splenomegaly or pathologic  nodal enlargement. Patent mesenteric vessels. There is a broad  differential. Recommend follow up in 3-6 months to assess stability.   Component Ref Range & Units (hover) 2 d ago 3 wk ago 1 mo ago  Calprotectin, Fecal 264 High  274 High  CM 297 High  CM  Comment: (NOTE)   Component Ref Range & Units (hover) 6 d ago  5-HIAA, Ur 3.2  Comment: (NOTE) This test was developed and its performance characteristics determined by [facility]. It has not been cleared or approved by the Food and Drug Administration.  5-HIAA,Quant.,24 Hr Urine 0.7  Comment: (NOTE) Performed At: [facility] [address] [place], [place] [id number] Jennette Shorter MD Ey:[id number]  Total Volume 210   Donna CHARLENA Commander, MD, [facility] Gastroenterology See Donna Wiley on call - gastroenterology for best contact person 03/12/2023 11:35 AM

## 2023-03-12 NOTE — Progress Notes (Signed)
  Progress Note   Patient: Donna Wiley [id number] DOB: 10-18-1940 DOA: 03/03/2023     8 DOS: the patient was seen and examined on 03/12/2023 qat 9:32AM      Brief hospital course: See prior summary     Assessment and Plan: * Subacute diarrhea 8 BM yesterday, no change in consistency.  VIP and microspor still pending - Continue empiric Flagyl  - Continue budesonide , Prevalite , Lomotil     Diabetes Still hyperglycemic, slightly better - Continue SS corrections - Continue glargine, increase dose  Paroxysmal atrial fibrillation (HCC) -Continue flecainide , Xarelto , diltiazem   Hypomagnesemia Nongap metabolic acidosis Due to diarrhea - Repeat BMP tomorrow        Subjective: No clinical change, no abdominal pain, no fever, no bleeding.     Physical Exam: BP (!) 148/62 (BP Location: Right Arm)   Pulse 75   Temp (!) 97.5 F (36.4 C) (Oral)   Resp 18   Ht 5' 2 (1.575 m)   Wt 53.6 kg   SpO2 98%   BMI 21.61 kg/m   Adult female, sitting up in bed, interactive and appropriate, pleasant affect RRR, no murmurs, no peripheral edema Respiratory rate unremarkable, lungs clear Abdomen soft and benign without tenderness palpation Attention normal, judgment insight appear normal, face metric, speech fluent    Data Reviewed: No new labs  Family Communication: None present    Disposition: Status is: Inpatient Defer to gastroenterology        Author: Lonni SHAUNNA Dalton, MD 03/12/2023 11:30 AM  For on call review [email or web address].

## 2023-03-13 DIAGNOSIS — R197 Diarrhea, unspecified: Secondary | ICD-10-CM | POA: Diagnosis not present

## 2023-03-13 DIAGNOSIS — N179 Acute kidney failure, unspecified: Secondary | ICD-10-CM | POA: Diagnosis not present

## 2023-03-13 DIAGNOSIS — K529 Noninfective gastroenteritis and colitis, unspecified: Secondary | ICD-10-CM | POA: Diagnosis not present

## 2023-03-13 LAB — VITAMIN D 25 HYDROXY (VIT D DEFICIENCY, FRACTURES): Vit D, 25-Hydroxy: 43.23 ng/mL (ref 30–100)

## 2023-03-13 LAB — BASIC METABOLIC PANEL
Anion gap: 6 (ref 5–15)
BUN: 15 mg/dL (ref 8–23)
CO2: 18 mmol/L — ABNORMAL LOW (ref 22–32)
Calcium: 8.4 mg/dL — ABNORMAL LOW (ref 8.9–10.3)
Chloride: 111 mmol/L (ref 98–111)
Creatinine, Ser: 0.78 mg/dL (ref 0.44–1.00)
GFR, Estimated: >60 mL/min (ref >60)
Glucose, Bld: 311 mg/dL — ABNORMAL HIGH (ref 70–99)
Potassium: 3.2 mmol/L — ABNORMAL LOW (ref 3.5–5.1)
Sodium: 135 mmol/L (ref 135–145)

## 2023-03-13 LAB — FOLATE: Folate: 18.7 ng/mL (ref >5.9)

## 2023-03-13 LAB — FECAL FAT, QUALITATIVE
Fat Qual Neutral, Stl: NORMAL
Fat Qual Total, Stl: INCREASED

## 2023-03-13 LAB — MAGNESIUM: Magnesium: 1.7 mg/dL (ref 1.7–2.4)

## 2023-03-13 LAB — GLUCOSE, CAPILLARY
Glucose-Capillary: 317 mg/dL — ABNORMAL HIGH (ref 70–99)
Glucose-Capillary: 318 mg/dL — ABNORMAL HIGH (ref 70–99)
Glucose-Capillary: 206 mg/dL — ABNORMAL HIGH (ref 70–99)
Glucose-Capillary: 160 mg/dL — ABNORMAL HIGH (ref 70–99)

## 2023-03-13 LAB — CBC
HCT: 30.2 % — ABNORMAL LOW (ref 36.0–46.0)
Hemoglobin: 9.6 g/dL — ABNORMAL LOW (ref 12.0–15.0)
MCH: 28 pg (ref 26.0–34.0)
MCHC: 31.8 g/dL (ref 30.0–36.0)
MCV: 88 fL (ref 80.0–100.0)
Platelets: 283 10*3/uL (ref 150–400)
RBC: 3.43 MIL/uL — ABNORMAL LOW (ref 3.87–5.11)
RDW: 15.6 % — ABNORMAL HIGH (ref 11.5–15.5)
WBC: 5.8 10*3/uL (ref 4.0–10.5)
nRBC: 0 % (ref 0.0–0.2)

## 2023-03-13 LAB — VITAMIN B12: Vitamin B-12: 670 pg/mL (ref 180–914)

## 2023-03-13 MED ORDER — POTASSIUM CHLORIDE CRYS ER 20 MEQ PO TBCR
40.0000 meq | EXTENDED_RELEASE_TABLET | Freq: Once | ORAL | Status: AC
  Administered 2023-03-13: 40 meq via ORAL
  Filled 2023-03-13: qty 2

## 2023-03-13 MED ORDER — INSULIN GLARGINE-YFGN 100 UNIT/ML ~~LOC~~ SOLN
12.0000 [IU] | Freq: Every day | SUBCUTANEOUS | Status: DC
  Administered 2023-03-13 – 2023-03-14 (×2): 12 [IU] via SUBCUTANEOUS
  Filled 2023-03-13 (×2): qty 0.12

## 2023-03-13 MED ORDER — MAGNESIUM SULFATE 2 GM/50ML IV SOLN
2.0000 g | Freq: Once | INTRAVENOUS | Status: AC
Start: 2023-03-13 — End: 2023-03-13
  Administered 2023-03-13: 2 g via INTRAVENOUS
  Filled 2023-03-13: qty 50

## 2023-03-13 NOTE — Plan of Care (Signed)

## 2023-03-13 NOTE — Progress Notes (Addendum)
 Progress Note  Primary GI: [facility] DOA: 03/03/2023         Hospital Day: 11   Subjective  Chief Complaint: Diarrhea  No family was present at the time of my evaluation. Patient sitting up in bed discussion back from the restroom.  States she is having smaller volume stool with more actual substance to it had 3 bowel movements last night all loose.  She denies fever, chills, abdominal pain, nausea vomiting.  States she has good appetite.  No edema. States overall since the diarrhea started her symptoms are 60 to 70% improved.  Patient is been diabetic over 50 years no microvascular changes no neuropathy.  She was on olmesartan was taken off for first admission, but was placed back on by primary care and has been off since she has been here this admission.    Objective   Vital signs in last 24 hours: Temp:  [97.6 F (36.4 C)-98 F (36.7 C)] 97.6 F (36.4 C) (02/10 0400) Pulse Rate:  [68-69] 69 (02/10 0400) Resp:  [18-20] 18 (02/10 0400) BP: (143-146)/(68) 143/68 (02/10 0400) SpO2:  [98 %-100 %] 98 % (02/10 0400) Last BM Date : 03/12/23 Last BM recorded by nurses in past 5 days Stool Type: Type 5 (Soft blobs with clear-cut edges) (03/13/2023  4:00 AM)  General:  elderly female in no acute distress  Heart:  Regular rate and rhythm; no murmurs Pulm: Clear anteriorly; no wheezing Abdomen:  Soft, Obese AB, Active bowel sounds. mild tenderness in the lower abdomen. Without guarding and Without rebound, No organomegaly appreciated. Extremities:  without  edema. Neurologic:  Alert and  oriented x4;  No focal deficits.  Psych:  Cooperative. Normal mood and affect.  Intake/Output from previous day: 02/09 0701 - 02/10 0700 In: 720 [P.O.:720] Out: 1225 [Urine:1100; Stool:125] Intake/Output this shift: Total I/O In: -  Out: 600 [Urine:600]  Studies/Results: No results found.  Lab Results: Recent Labs    03/11/23 0626 03/13/23 0513  WBC 5.2 5.8  HGB 9.8* 9.6*  HCT 31.1*  30.2*  PLT 302 283   BMET Recent Labs    03/11/23 0626 03/13/23 0513  NA 135 135  K 3.5 3.2*  CL 113* 111  CO2 14* 18*  GLUCOSE 349* 311*  BUN 8 15  CREATININE 0.84 0.78  CALCIUM 8.5* 8.4*   LFT Recent Labs    03/11/23 0626  PROT 4.5*  ALBUMIN 2.4*  AST 32  ALT 26  ALKPHOS 89  BILITOT 0.7   PT/INR No results for input(s): "LABPROT", "INR" in the last 72 hours.   Scheduled Meds: . budesonide   9 mg Oral Daily  . cholestyramine  light  4 g Oral BID  . diltiazem   180 mg Oral QHS  . diphenoxylate -atropine   2 tablet Oral QID  . flecainide   100 mg Oral QHS  . flecainide   50 mg Oral Daily  . insulin  aspart  0-15 Units Subcutaneous TID WC  . insulin  aspart  0-5 Units Subcutaneous QHS  . insulin  glargine-yfgn  12 Units Subcutaneous Daily  . LORazepam   0.5 mg Oral QHS  . metroNIDAZOLE   250 mg Oral TID  . potassium chloride   40 mEq Oral Once  . rivaroxaban   15 mg Oral Q supper  . simvastatin   10 mg Oral QHS  . sodium bicarbonate   650 mg Oral BID   Continuous Infusions:    Patient profile:   83 year old female from [place]  past medical history 10 years of diabetes hospitalized  for the third time in 2 months with diarrhea resulting in dehydration, readmitted for diarrhea. Colonoscopy, upper endoscopy and capsule endoscopy with biopsies unremarkable CT AB and pelvis with contrast [facility] Pancreas: Moderate atrophy of the pancreas.  Moderate central mesenteric stranding with some small nodes.  Progressive from older examinations. No splenomegaly or pathologic  nodal enlargement. Patent mesenteric vessels. There is a broad  differential. Recommend follow up in 3-6 months to assess stability.  CT abdomen pelvis WITHOUT contrast 02/03/2022 IMPRESSION: Fluid throughout the small bowel which may represent some mild enteritis. Stable appearance of mesenteric stranding with small lymph nodes. No significant progression is noted. This may simply  represent mesenteric adenitis.   Impression/Plan:   Secretory diarrhea With associated weight loss, no hematochezia  Negative gastrin, urine 5HIAA, celiac studies, C diff, pathogen panel, O&P Random colon bxs NL Duodenal bxs "acute duodenitis" Patient has previous use of olmesartan, started in between admission again by PCP, currently off.  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 and trial of clonidine if symptoms do not improve and all labs are unremarkable  IDA 03/13/2023  HGB 9.6 MCV 88.0 Platelets 283 02/22/2023 Iron 20 Ferritin 19  With anemia, potential secretory diarrhea and associated weight loss  I recommend testing for deficiency of vitamins D, E, B12, folic acid Consider oral iron  Hypomagnesia/hypokalemia Potassium 3.2  Magnesium  1.7   PAF -continue xarelto   Diabetes Per primary  Principal Problem:   Subacute diarrhea Active Problems:   Type 1 diabetes mellitus (HCC)   Normocytic anemia   Hypokalemia   Paroxysmal atrial fibrillation (HCC)   Dyslipidemia    LOS: 9 days   Edmonia Gottron  03/13/2023, 8:15 AM

## 2023-03-13 NOTE — Plan of Care (Signed)
   Problem: Education: Goal: Knowledge of General Education information will improve Description Including pain rating scale, medication(s)/side effects and non-pharmacologic comfort measures Outcome: Progressing

## 2023-03-13 NOTE — Progress Notes (Signed)
  Progress Note   Patient: Donna Wiley ZOX:[id number] DOB: 1940/12/17 DOA: 03/03/2023     9 DOS: the patient was seen and examined on 03/13/2023 at 11:36AM      Brief hospital course: 83 y.o. F with T1DM on insulin  pump, pAF on Xarelto  and iron deficiency anemia who presented with persistent diarrhea.      Assessment and Plan: * Subacute diarrhea -Follow VIP and microsporidia - Continue empiric budesonide , Flagyl  - Continue Lomotil , Prevalite   Dyslipidemia - Continue simvastatin   Paroxysmal atrial fibrillation (HCC) -Continue flecainide , diltiazem  - Continue Xarelto   Hypokalemia -Supplement potassium  Hypomagnesemia -Supplement magnesium   Acute metabolic acidosis Nongap acidosis, possibly from IV fluids, possibly from bicarb loss in stool, have been supplementing - Stop bicarb supplements, monitor BMP  Type 1 diabetes mellitus (HCC) Still hyperglycemic - Increase glargine - Continue sliding scale corrections          Subjective: Stool frequency is still elevated, but overall improved from previous.  She has had no dizziness, no abdominal pain, no fever.     Physical Exam: BP 121/60   Pulse 74   Temp 97.9 F (36.6 C) (Oral)   Resp 20   Ht 5\' 2"  (1.575 m)   Wt 53.6 kg   SpO2 100%   BMI 21.61 kg/m   Thin adult female, lying in bed, interactive and appropriate RRR, no murmurs, no peripheral edema Respiratory rate normal, lungs clear without rales or wheezes Abdomen soft no tenderness palpation or guarding Attention normal, affect appropriate, judgment and insight appear normal    Data Reviewed: Basic metabolic panel shows hypokalemia, magnesium  normal Vitamin D , vitamin B12, and folate normal CBC shows stable anemia Glucose is elevated  Family Communication:     Disposition: Status is: Inpatient         Author: Ephriam Hashimoto, MD 03/13/2023 4:11 PM  For on call review [email or web address].

## 2023-03-14 DIAGNOSIS — N179 Acute kidney failure, unspecified: Secondary | ICD-10-CM | POA: Diagnosis not present

## 2023-03-14 DIAGNOSIS — R197 Diarrhea, unspecified: Secondary | ICD-10-CM | POA: Diagnosis not present

## 2023-03-14 LAB — BASIC METABOLIC PANEL
Anion gap: 7 (ref 5–15)
BUN: 14 mg/dL (ref 8–23)
CO2: 19 mmol/L — ABNORMAL LOW (ref 22–32)
Calcium: 8.5 mg/dL — ABNORMAL LOW (ref 8.9–10.3)
Chloride: 109 mmol/L (ref 98–111)
Creatinine, Ser: 0.91 mg/dL (ref 0.44–1.00)
GFR, Estimated: >60 mL/min (ref >60)
Glucose, Bld: 254 mg/dL — ABNORMAL HIGH (ref 70–99)
Potassium: 3.6 mmol/L (ref 3.5–5.1)
Sodium: 135 mmol/L (ref 135–145)

## 2023-03-14 LAB — GLUCOSE, CAPILLARY
Glucose-Capillary: 289 mg/dL — ABNORMAL HIGH (ref 70–99)
Glucose-Capillary: 219 mg/dL — ABNORMAL HIGH (ref 70–99)

## 2023-03-14 LAB — MAGNESIUM: Magnesium: 2.2 mg/dL (ref 1.7–2.4)

## 2023-03-14 MED ORDER — METRONIDAZOLE 250 MG PO TABS: 250.0000 mg | ORAL_TABLET | Freq: Three times a day (TID) | ORAL | 0 refills | Status: AC

## 2023-03-14 MED ORDER — LANCET DEVICE MISC: 1.0000 | Freq: Three times a day (TID) | 0 refills | Status: AC

## 2023-03-14 MED ORDER — INSULIN SYRINGE-NEEDLE U-100 25G X 5/8" 1 ML MISC: 1.0000 | Freq: Three times a day (TID) | 0 refills | Status: AC

## 2023-03-14 MED ORDER — CHOLESTYRAMINE 4 G PO PACK: 4.0000 g | PACK | Freq: Two times a day (BID) | ORAL | 3 refills | Status: AC

## 2023-03-14 MED ORDER — DIPHENOXYLATE-ATROPINE 2.5-0.025 MG PO TABS: 1.0000 | ORAL_TABLET | Freq: Four times a day (QID) | ORAL | 3 refills | Status: AC

## 2023-03-14 MED ORDER — LANCETS MISC: 1.0000 | Freq: Three times a day (TID) | 0 refills | Status: AC

## 2023-03-14 MED ORDER — PEN NEEDLES 31G X 5 MM MISC: 1.0000 | Freq: Three times a day (TID) | 0 refills | Status: AC

## 2023-03-14 MED ORDER — INSULIN GLARGINE 100 UNIT/ML SOLOSTAR PEN: 12.0000 [IU] | PEN_INJECTOR | Freq: Every day | SUBCUTANEOUS | 0 refills | Status: AC

## 2023-03-14 MED ORDER — BUDESONIDE 3 MG PO CPEP: 9.0000 mg | ORAL_CAPSULE | Freq: Every day | ORAL | 3 refills | Status: AC

## 2023-03-14 MED ORDER — BLOOD GLUCOSE TEST VI STRP: 1.0000 | ORAL_STRIP | Freq: Three times a day (TID) | 0 refills | Status: AC

## 2023-03-14 MED ORDER — BLOOD GLUCOSE MONITORING SUPPL DEVI: 1.0000 | Freq: Three times a day (TID) | 0 refills | Status: AC

## 2023-03-14 NOTE — Inpatient Diabetes Management (Signed)
Inpatient Diabetes Program Recommendations  AACE/ADA: New Consensus Statement on Inpatient Glycemic Control (2015)  Target Ranges:  Prepandial:   less than 140 mg/dL      Peak postprandial:   less than 180 mg/dL (1-2 hours)      Critically ill patients:  140 - 180 mg/dL   Lab Results  Component Value Date   GLUCAP 219 (H) 03/14/2023   HGBA1C 7.7 (H) 02/15/2023    Review of Glycemic Control  Diabetes history: DM1 Outpatient Diabetes medications: Insulin pump Current orders for Inpatient glycemic control: Lantus 12 units daily, Novolog 0-15 TID with meals and 0-9 HS  Inpatient Diabetes Program Recommendations:    Discharge Recommendations: Long acting recommendations: Insulin Glargine (LANTUS) Solostar Pen 12 units daily in am  Short acting recommendations:  Meal + Correction coverage Insulin lispro (HUMALOG) KwikPen  Sensitive Scale.  1 unit for every 40 above goal of 120-140 for correction and 1 unit/3.3-10 g carb, depending on time (insulin pump rates) Supply/Referral recommendations: Glucometer Test strips Lancet device Lancets Pen needles - standard   Use Adult Diabetes Insulin Treatment Post Discharge order set.  Spoke with pt regarding home recs and seeing Endo prior to restarting pump to change settings with new medication.  Answered all questions. Discussed with Dr Maryfrances Bunnell.  Thank you. Ailene Ards, RD, LDN, CDCES Inpatient Diabetes Coordinator [phone]

## 2023-03-14 NOTE — Progress Notes (Addendum)
Progress Note  Primary GI: [facility] Kathrynn Running. Lanae Boast, MD  DOA: 03/03/2023         Hospital Day: 12   Subjective  Chief Complaint: Diarrhea  No family was present at the time of my evaluation. Last recorded stool was yesterday morning, patient said she had 4 total stools yesterday very small volume.  She had no bowel movements overnight. Patient denies abdominal pain, nausea or vomiting.   Objective   Vital signs in last 24 hours: Temp:  [97.5 F (36.4 C)-98.7 F (37.1 C)] 98.7 F (37.1 C) (02/11 0529) Pulse Rate:  [61-74] 61 (02/11 0529) Resp:  [17-20] 17 (02/11 0529) BP: (121-138)/(60-68) 138/62 (02/11 0529) SpO2:  [98 %-100 %] 98 % (02/11 0529) Last BM Date : 03/13/23 Last BM recorded by nurses in past 5 days Stool Type: Type 5 (Soft blobs with clear-cut edges) (03/13/2023  4:00 AM)  General:  elderly female in no acute distress  Heart:  Regular rate and rhythm; no murmurs Pulm: Clear anteriorly; no wheezing Abdomen:  Soft, Obese AB, Active bowel sounds. No tenderness in the lower abdomen.  Extremities:  without  edema. Neurologic:  Alert and  oriented x4;  No focal deficits.  Psych:  Cooperative. Normal mood and affect.  Intake/Output from previous day: 02/10 0701 - 02/11 0700 In: 529.4 [P.O.:480; IV Piggyback:49.4] Out: 1500 [Urine:1500] Intake/Output this shift: No intake/output data recorded.  Studies/Results: No results found.  Lab Results: Recent Labs    03/13/23 0513  WBC 5.8  HGB 9.6*  HCT 30.2*  PLT 283   BMET Recent Labs    03/13/23 0513 03/14/23 0540  NA 135 135  K 3.2* 3.6  CL 111 109  CO2 18* 19*  GLUCOSE 311* 254*  BUN 15 14  CREATININE 0.78 0.91  CALCIUM 8.4* 8.5*   LFT No results for input(s): "PROT", "ALBUMIN", "AST", "ALT", "ALKPHOS", "BILITOT", "BILIDIR", "IBILI" in the last 72 hours.  PT/INR No results for input(s): "LABPROT", "INR" in the last 72 hours.   Scheduled Meds:  budesonide  9 mg Oral Daily    cholestyramine light  4 g Oral BID   diltiazem  180 mg Oral QHS   diphenoxylate-atropine  2 tablet Oral QID   flecainide  100 mg Oral QHS   flecainide  50 mg Oral Daily   insulin aspart  0-15 Units Subcutaneous TID WC   insulin aspart  0-5 Units Subcutaneous QHS   insulin glargine-yfgn  12 Units Subcutaneous Daily   LORazepam  0.5 mg Oral QHS   rivaroxaban  15 mg Oral Q supper   simvastatin  10 mg Oral QHS   Continuous Infusions:    Patient profile:   83 year old female from [place] past medical history 10 years of diabetes hospitalized for the third time in 2 months with diarrhea resulting in dehydration, readmitted for diarrhea. Colonoscopy, upper endoscopy and capsule endoscopy with biopsies unremarkable CT AB and pelvis with contrast [facility]06/[facility]23 [facility] Pancreas: Moderate atrophy of the pancreas.  Moderate central mesenteric stranding with some small nodes.  Progressive from older examinations. No splenomegaly or pathologic  nodal enlargement. Patent mesenteric vessels. There is a broad  differential. Recommend follow up in 3-6 months to assess stability.  CT abdomen pelvis WITHOUT contrast 02/03/2022 IMPRESSION: Fluid throughout the small bowel which may represent some mild enteritis. Stable appearance of mesenteric stranding with small lymph nodes. No significant progression is noted. This may simply represent mesenteric adenitis.   Impression/Plan:  Secretory diarrhea With associated weight loss, no hematochezia  Negative gastrin, urine 5HIAA, celiac studies, C diff, pathogen panel, O&P Random colon bxs NL Duodenal bxs "acute duodenitis" Patient has previous use of olmesartan, started in between admissions again by PCP, currently off.  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   Initial pancreatic elastase was low at 60 however this could be dilutional from diarrhea had no response to Creon, this was discontinued.   Pending vasoactive intestinal peptide, microsporidia spore stain -patient able to hydrate, no further Bm's overnight, she is ready to go home today. -Metronidazole 250 mg 3 times daily started 2/7, continue total of 10 days can stop 2/17 -Fecal calprotectin consistently slightly elevated at 264,  Started budesonide 2/7, can continue outpatient with follow up with [facility] with repeat of fecal cal protectin and determination of length of course -continue outpatient Colesytramine twice daily 4 g Lomotil 4 times daily 2.5 mg. - suggest outpatient follow up with primary GI at [facility], patient follows with Kathrynn Running. Shearin, MD.  Malachi Bonds 03/13/2023  HGB 9.6 MCV 88.0 Platelets 283 02/22/2023 Iron 20 Ferritin 19 B12 670 Recent Labs    02/26/23 1205 03/03/23 1558 03/03/23 2252 03/05/23 0535 03/06/23 1106 03/07/23 0523 03/08/23 0535 03/09/23 0608 03/11/23 0626 03/13/23 0513  HGB 11.0* 12.1 10.2* 10.0* 10.2* 9.7* 11.0* 10.0* 9.8* 9.6*  No GI blood loss Unremarkable B12, folate. Consider oral iron  Hypomagnesia/hypokalemia BUN 14 Cr 0.91  Potassium 3.6  Magnesium 2.2  improved  PAF -continue xarelto  Diabetes Has had some slightly increased sugars with budesonide  patient has questions about adding back on insulin pump today and needing a diabetic meter prescription for outpatient-will discuss with primary team Will need close outpatient follow-up with primary care/endocrinologist   Principal Problem:   Subacute diarrhea Active Problems:   Type 1 diabetes mellitus (HCC)   Normocytic anemia   Hypokalemia   Paroxysmal atrial fibrillation (HCC)   Dyslipidemia    LOS: 10 days   Doree Albee  03/14/2023, 8:20 AM

## 2023-03-14 NOTE — Discharge Summary (Signed)
Physician Discharge Summary   Patient: Donna Wiley MRN: [id number] DOB: 1940/05/30  Admit date:     03/03/2023  Discharge date: 03/14/23  Discharge Physician: Alberteen Sam   PCP: Raquel James, MD     Recommendations at discharge:  Follow up with Dr. Lanae Boast GI in 1 week for subacute intractable diarrhea Dr. Lanae Boast: Follow up microsporidia stool stains and VIP Please obtain BMP and mag in 1 week     Discharge Diagnoses: Principal Problem:   Subacute diarrhea Active Problems:   Type 1 diabetes mellitus (HCC)   Normocytic anemia   Hypokalemia   Paroxysmal atrial fibrillation (HCC)   Dyslipidemia      Hospital Course: 83 y.o. F with T1DM on insulin pump, pAF on Xarelto and iron deficiency anemia who presented with persistent diarrhea.         * Subacute diarrhea Third admission for diarrhea.  Has not been to see her Gastroenterologist yet.  Colon biopsy in December showed benign colonic mucosa.    Duodenal biopsy this admission showed acute duodenitis, nonspecific.  Likewise, stomach biopsy showed mild chronic gastritis.  Again this admission, Cdiff and GI pathogen PCR panel negative.  GI consulted again, Lomotil, Prevalite continued.  She had not been able to obtain rifaximin, so Flagyl was started here.  Olmesartan was held.  5HIAA and gastrin levls normal.  VIP level pending.  Microsporidia sent.  Fecal calprotectin persistently elevated through this.  Fecal fat persistently elevated.    Per GI notes: "Chronic diarrhea - watery (mostly)   Cause unclear   Elevated fecal calprotectin x 3 now, no infection found. Urine 5HIAA, gastrin are negative. VIP pending. Micorosporidia test pending. Some duodenitis on EGD before and very patchy small bowel inflammation - mild on capsule endoscopy - mildly edematous proximal small bowel mucosa, single faint erosion and possible subtle bowel wall edema in distal small bowel but mostly normal  study(both in Jan). Abnormal fecal elastase thought to be dilutional, did have increased qualitative fat - is not worse and seems better since stopping Creon - 12/6 CT w/ IV contrast - moderate pancreatic atrophy?? 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    On Lomotil, metronidazole, cholestyramine, budesonide"   Also:  "Secretory /inflammatory diarrhea with remarkable findings on workup to include elevation in fecal calprotectin, nonspecific mesenteric stranding on CT scan.  Capsule endoscopy with some nonspecific changes.   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  On empiric budesonide 9 mg daily, Flagyl, Lomotil, and cholestyramine, her frequency has significantly improved."    She has been off IV fluids for 1 week and appears safe for discharge at this time.  She is maintaining her hydration and stool frequency has decreased.  She will continue on Flagyl for 10 days  She will continue on Entocort until follow up with her GI Dr. Lanae Boast  She will conitnue Lomotil and cholestyramine.  The [facility] Controlled Substances Registry was reviewed for this patient prior to discharge.   Consultants: Gastroenterology Drs. Leone Payor and Armbruster Procedures performed: Capsule endoscopy EGD   Disposition: Home Diet recommendation:  Discharge Diet Orders (From admission, onward)     Start     Ordered   03/14/23 0000  Diet - low sodium heart healthy         03/14/23 1351             DISCHARGE MEDICATION: Allergies as of 03/14/2023       Reactions   Iodine Anaphylaxis   Ivp Dye [iodinated Contrast Media] Anaphylaxis   Nickel Rash   Charentais Melon (french Melon) Swelling   Cherry Swelling   Codeine Other (See Comments)   Hallucinations    Olmesartan Diarrhea   Possible olmesartan enteropathy, other ARBs would be fine   Other Swelling   Tree nuts   Shellfish Allergy Swelling   Sulfa Antibiotics Swelling   Tape Dermatitis   Paper tape is best PAPER TAPE        Medication List     STOP taking these medications    Creon 36000-114000 units Cpep capsule Generic drug: lipase/protease/amylase   Xifaxan 550 MG Tabs tablet Generic drug: rifaximin       TAKE these medications    b complex vitamins capsule Take 1 capsule by mouth every evening.   Blood Glucose Monitoring Suppl Devi 1 each by Does not apply route 3 (three) times daily. May dispense any manufacturer covered by patient's insurance.   BLOOD GLUCOSE TEST STRIPS Strp 1 each by Does not apply route 3 (three) times daily. Use as directed to check blood sugar. May dispense any manufacturer covered by patient's insurance and fits patient's device.   budesonide 3 MG 24 hr capsule Commonly known as: ENTOCORT EC Take 3 capsules (9 mg total) by mouth daily. Start taking on: March 15, 2023   calcium citrate 950 (200 Ca) MG tablet Commonly known as: CALCITRATE - dosed in mg elemental calcium Take 500 mg of elemental calcium by mouth 2 (two) times daily.   cholestyramine 4 g packet Commonly known as: QUESTRAN Take 1 packet (4 g total) by mouth 2 (two) times daily.   clobetasol cream 0.05 % Commonly known as: TEMOVATE Apply 1 Application topically 2 (two) times daily as needed (Dermatitis).   diltiazem 180 MG 24 hr capsule Commonly known as: CARDIZEM CD Take 180 mg by mouth at bedtime.   diphenoxylate-atropine 2.5-0.025 MG tablet Commonly known as:  LOMOTIL Take 1 tablet by mouth 4 (four) times daily.   flecainide 50 MG tablet Commonly known as: TAMBOCOR Take 50-100 mg by mouth See admin instructions. Take 50mg  (1 tablet) by mouth every morning and 100mg  (2 tablets) every evening.   insulin glargine 100 UNIT/ML Solostar Pen Commonly known as: LANTUS Inject 12 Units into the skin daily. May substitute as needed per insurance.   insulin lispro 100 UNIT/ML injection Commonly known as: HUMALOG Inject 0-15 Units into the skin 4 (four) times daily. Per pump   Insulin Syringe-Needle U-100 25G X 5/8" 1 ML Misc 1 each by Does not apply route 3 (three) times daily. May dispense any manufacturer covered by patient's insurance.   Lancet Device Misc 1 each by Does not apply route 3 (three) times daily. May dispense any manufacturer covered by patient's insurance.   Lancets Misc 1 each by Does not apply route 3 (three) times daily. Use as directed to check blood sugar. May  dispense any manufacturer covered by patient's insurance and fits patient's device.   LORazepam 0.5 MG tablet Commonly known as: ATIVAN Take 0.5 mg by mouth at bedtime.   metroNIDAZOLE 250 MG tablet Commonly known as: FLAGYL Take 1 tablet (250 mg total) by mouth 3 (three) times daily for 10 days.   Myrbetriq 50 MG Tb24 tablet Generic drug: mirabegron ER Take 50 mg by mouth in the morning.   ondansetron 4 MG disintegrating tablet Commonly known as: ZOFRAN-ODT Take 1 tablet (4 mg total) by mouth every 8 (eight) hours as needed for nausea or vomiting.   Pen Needles 31G X 5 MM Misc 1 each by Does not apply route 3 (three) times daily. May dispense any manufacturer covered by patient's insurance.   rivaroxaban 20 MG Tabs tablet Commonly known as: XARELTO Take 20 mg by mouth every evening.   simvastatin 20 MG tablet Commonly known as: ZOCOR Take 10 mg by mouth at bedtime.        Follow-up Information     Sydnee Cabal., MD. Schedule an appointment as soon  as possible for a visit in 1 week(s).   Specialty: Gastroenterology Contact information: [address] [place] [address]         Raquel James, MD. Schedule an appointment as soon as possible for a visit in 1 week(s).   Specialty: Family Medicine Contact information: [address] [place] [address] [phone]                 Discharge Instructions     Diet - low sodium heart healthy   Complete by: As directed    Discharge instructions   Complete by: As directed    **IMPORTANT DISCHARGE INSTRUCTIONS**   From Dr. Maryfrances Bunnell: STOP the rifaximin and Creon  START the new medicine Entocort (budesonide) START the new medicine metronidazole for 10 days then stop  Go see Dr. Lanae Boast as soon as you can get an appointment     Lomotil            8AM     3PM     6PM    10PM  Flagyl              8AM     3PM                 10PM  Budesonide                            5PM Xarelto                                                10PM Prevalite                 11AM                       11PM Diltiazem                                             10PM Flecainide             10AM  10PM Simvastatin                                         10PM Trazodone                                           10PM Ativan                                                   10PM    For insulin, use Lantus 12 units daily PLUS your home lispro as you normally would Call your endocrinologist to reset your pump   Increase activity slowly   Complete by: As directed        Discharge Exam: Filed Weights   03/03/23 1551 03/04/23 0857  Weight: 50 kg 53.6 kg    General: Pt is alert, awake, not in acute distress Cardiovascular: RRR, nl S1-S2, no murmurs appreciated.   No LE edema.   Respiratory: Normal respiratory rate and rhythm.  CTAB without rales or wheezes. Abdominal: Abdomen soft and non-tender.  No distension or HSM.   Neuro/Psych:  Strength symmetric in upper and lower extremities.  Judgment and insight appear normal.   Condition at discharge: good  The results of significant diagnostics from this hospitalization (including imaging, microbiology, ancillary and laboratory) are listed below for reference.   Imaging Studies: CT ABDOMEN PELVIS WO CONTRAST Result Date: 02/13/2023 CLINICAL DATA:  Recurrent and persistent diarrhea EXAM: CT ABDOMEN AND PELVIS WITHOUT CONTRAST TECHNIQUE: Multidetector CT imaging of the abdomen and pelvis was performed following the standard protocol without IV contrast. RADIATION DOSE REDUCTION: This exam was performed according to the departmental dose-optimization program which includes automated exposure control, adjustment of the mA and/or kV according to patient size and/or use of iterative reconstruction technique. COMPARISON:  02/04/2023 FINDINGS: Lower chest: No acute abnormality. Hepatobiliary: No focal liver abnormality is seen. Status post cholecystectomy. No biliary dilatation. Pancreas: No focal abnormality or ductal dilatation. Spleen: No focal abnormality.  Normal size. Adrenals/Urinary Tract: No adrenal abnormality. No focal renal abnormality. No stones or hydronephrosis. Urinary bladder is unremarkable. Stomach/Bowel: Fluid throughout the large intestine may be related to diarrhea. No bowel wall thickening or evidence of obstruction. Vascular/Lymphatic: Aortoiliac atherosclerosis. No evidence of aneurysm or adenopathy. Reproductive: Prior hysterectomy.  No adnexal masses. Other: No free fluid or free air. Musculoskeletal: No acute bony abnormality. IMPRESSION: Fluid with scattered air-fluid levels throughout the colon compatible with given history of diarrhea. No evidence of bowel inflammation, wall thickening or bowel obstruction. Electronically Signed   By: Charlett Nose M.D.   On: 02/13/2023 17:50    Microbiology: Results for orders placed or performed during the hospital encounter of  03/03/23  Gastrointestinal Panel by PCR , Stool     Status: None   Collection Time: 03/03/23 10:16 PM   Specimen: Stool  Result Value Ref Range Status   Campylobacter species NOT DETECTED NOT DETECTED Final   Plesimonas shigelloides NOT DETECTED NOT DETECTED Final   Salmonella species NOT DETECTED NOT DETECTED Final   Yersinia enterocolitica NOT DETECTED NOT DETECTED Final   Vibrio species NOT DETECTED NOT DETECTED Final  Vibrio cholerae NOT DETECTED NOT DETECTED Final   Enteroaggregative E coli (EAEC) NOT DETECTED NOT DETECTED Final   Enteropathogenic E coli (EPEC) NOT DETECTED NOT DETECTED Final   Enterotoxigenic E coli (ETEC) NOT DETECTED NOT DETECTED Final   Shiga like toxin producing E coli (STEC) NOT DETECTED NOT DETECTED Final   Shigella/Enteroinvasive E coli (EIEC) NOT DETECTED NOT DETECTED Final   Cryptosporidium NOT DETECTED NOT DETECTED Final   Cyclospora cayetanensis NOT DETECTED NOT DETECTED Final   Entamoeba histolytica NOT DETECTED NOT DETECTED Final   Giardia lamblia NOT DETECTED NOT DETECTED Final   Adenovirus F40/41 NOT DETECTED NOT DETECTED Final   Astrovirus NOT DETECTED NOT DETECTED Final   Norovirus GI/GII NOT DETECTED NOT DETECTED Final   Rotavirus A NOT DETECTED NOT DETECTED Final   Sapovirus (I, II, IV, and V) NOT DETECTED NOT DETECTED Final    Comment: Performed at [facility], [address] [place], [place] [address]  C Difficile Quick Screen w PCR reflex     Status: None   Collection Time: 03/03/23 10:16 PM   Specimen: Stool  Result Value Ref Range Status   C Diff antigen NEGATIVE NEGATIVE Final   C Diff toxin NEGATIVE NEGATIVE Final   C Diff interpretation No C. difficile detected.  Final    Comment: Performed at [facility], [address] [place], [place] [address]  Calprotectin, Fecal     Status: Abnormal   Collection Time: 03/08/23  1:52 PM   Specimen: Stool  Result Value Ref Range Status   Calprotectin, Fecal 264 (H) 0 -  120 ug/g Final    Comment: (NOTE) Concentration     Interpretation   Follow-Up < 5 - 50 ug/g     Normal           None >50 -120 ug/g     Borderline       Re-evaluate in 4-6 weeks    >120 ug/g     Abnormal         Repeat as clinically                                   indicated Performed At: [facility] [address] [place], [place] [id number] Jolene Schimke MD WG:[id number]     Labs: CBC: Recent Labs  Lab 03/08/23 0535 03/09/23 0608 03/11/23 0626 03/13/23 0513  WBC 5.8 4.7 5.2 5.8  HGB 11.0* 10.0* 9.8* 9.6*  HCT 35.7* 32.2* 31.1* 30.2*  MCV 90.2 89.7 87.9 88.0  PLT 307 301 302 283   Basic Metabolic Panel: Recent Labs  Lab 03/08/23 0535 03/09/23 0608 03/11/23 0626 03/13/23 0513 03/14/23 0540  NA 137 135 135 135 135  K 3.5 3.3* 3.5 3.2* 3.6  CL 119* 113* 113* 111 109  CO2 12* 13* 14* 18* 19*  GLUCOSE 149* 200* 349* 311* 254*  BUN 8 7* 8 15 14   CREATININE 0.75 0.88 0.84 0.78 0.91  CALCIUM 8.5* 8.3* 8.5* 8.4* 8.5*  MG 1.9  --  1.5* 1.7 2.2  PHOS 2.9  --  3.0  --   --    Liver Function Tests: Recent Labs  Lab 03/08/23 0535 03/09/23 0608 03/11/23 0626  AST 26 47* 32  ALT 19 25 26   ALKPHOS 48 126 89  BILITOT 0.5 0.5 0.7  PROT 5.2* 4.5* 4.5*  ALBUMIN 2.8* 2.6* 2.4*   CBG: Recent Labs  Lab 03/13/23  1116 03/13/23 1634 03/13/23 2111 03/14/23 0744 03/14/23 1148  GLUCAP 318* 206* 160* 289* 219*    Discharge time spent: approximately 50 minutes spent on discharge counseling, evaluation of patient on day of discharge, and coordination of discharge planning with nursing, social work, pharmacy and case management  Signed: Alberteen Sam, MD [facility] 03/14/2023

## 2023-03-14 NOTE — Plan of Care (Signed)

## 2023-03-15 ENCOUNTER — Ambulatory Visit: Payer: Self-pay

## 2023-03-15 LAB — VASOACTIVE INTESTINAL PEPTIDE (VIP): Vasoactive Intest Polypeptide: <16.8 pg/mL (ref 0.0–58.8)

## 2023-03-15 NOTE — Telephone Encounter (Signed)
Message from [place] sent at 03/15/2023 10:52 AM EST  Summary: Missing Rx at pharmacy   Pt called reporting that she has contacted her pharmacy and they state that they are missing her budesonide even though we have a receipt confirming otherwise. Pt has a PCP but was prescribed this leaving the ED 03/03/2023.  Best contact: [phone]  I called [id number] [place], [place] - [address] AT [facility] [address] [address] [facility] [address], [place] [address]  And was unable to reach anyone on their line for providers.         Chief Complaint: pt called stated the budesonide was not at pharmacy Symptoms: n/a Frequency: n/a Pertinent Negatives: Patient denies n/a Disposition: [] ED /[] Urgent Care (no appt availability in office) / [] Appointment(In office/virtual)/ []  [facility]/ [x] Home Care/ [] Refused Recommended Disposition /[]  Mobile Bus/ []  Follow-up with PCP Additional Notes: called [facility] and spoke with Clydie Braun who transferred call to [facility] for [employer]. Advised med not at pharmacy. She advised to call [facility] line at [phone]. Called  and spoke to [place] and gave message that the med was missing at pharmacy and MRN. Was advised that will send message to provider and pt should call pharmacy later today. Called pt and advised of the same.   Reason for Disposition  [1] Prescription prescribed recently is not at pharmacy AND [2] triager has access to patient's EMR AND [3] prescription is recorded in the EMR  Answer Assessment - Initial Assessment Questions 1. NAME of MEDICINE: "What medicine(s) are you calling about?"     budesonide 2. QUESTION: "What is your question?" (e.g., double dose of medicine, side effect)     Missing at pharmacy 3. PRESCRIBER: "Who prescribed the medicine?" Reason: if prescribed by specialist, call should be referred to that group.     Dr Maryfrances Bunnell  Protocols used: Medication Question  Call-A-AH

## 2023-03-16 LAB — VITAMIN E
Vitamin E (Alpha Tocopherol): 8.4 mg/L — ABNORMAL LOW (ref 9.0–29.0)
Vitamin E(Gamma Tocopherol): 1.5 mg/L (ref 0.5–4.9)

## 2023-03-18 LAB — MICROSPORIDIA SPORE STAIN, FECES: Microsporidia Spore: NEGATIVE

## 2023-05-01 ENCOUNTER — Ambulatory Visit: Payer: Medicare PPO | Admitting: Internal Medicine

## 2023-05-01 ENCOUNTER — Other Ambulatory Visit: Payer: Self-pay

## 2023-05-01 VITALS — BP 128/68 | HR 91 | Temp 97.9°F | Resp 18 | Wt 119.7 lb

## 2023-05-01 DIAGNOSIS — H1013 Acute atopic conjunctivitis, bilateral: Secondary | ICD-10-CM

## 2023-05-01 DIAGNOSIS — T7800XD Anaphylactic reaction due to unspecified food, subsequent encounter: Secondary | ICD-10-CM | POA: Diagnosis not present

## 2023-05-01 DIAGNOSIS — J302 Other seasonal allergic rhinitis: Secondary | ICD-10-CM | POA: Diagnosis not present

## 2023-05-01 DIAGNOSIS — H1010 Acute atopic conjunctivitis, unspecified eye: Secondary | ICD-10-CM

## 2023-05-01 DIAGNOSIS — L2389 Allergic contact dermatitis due to other agents: Secondary | ICD-10-CM

## 2023-05-01 DIAGNOSIS — T7800XA Anaphylactic reaction due to unspecified food, initial encounter: Secondary | ICD-10-CM

## 2023-05-01 DIAGNOSIS — J3089 Other allergic rhinitis: Secondary | ICD-10-CM | POA: Diagnosis not present

## 2023-05-01 MED ORDER — LEVOCETIRIZINE DIHYDROCHLORIDE 5 MG PO TABS: 5.0000 mg | ORAL_TABLET | Freq: Every evening | ORAL | 5 refills | Status: AC

## 2023-05-01 NOTE — Progress Notes (Signed)
 Follow Up Note  RE: Donna Wiley MRN: [id number] DOB: 1940/03/22 Date of Office Visit: 05/01/2023  Referring provider: Raquel James, MD Primary care provider: Raquel James, MD  Chief Complaint: Follow-up (Follow up allergys sneezing and runny nose. Refill levoceterizine/) and Medication Refill  History of Present Illness: I had the pleasure of seeing Donna Wiley for a follow up visit at the [facility] on 05/01/2023. She is a 83 y.o. female, who is being followed for contact dermatitis, allergic rhinitis, food allergy. Her previous allergy office visit was on 10/31/22 with Dr. Marlynn Perking. Today is a regular follow up visit.  History obtained from patient, chart review .  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   - Not interested reintroduction    Allergic rhinitis:  symptoms  mild rhinnorrhea and sneezing when working outside , resolves with as needed benadryl .   Previous allergy testing:  07/05/2021:positive to grass, weed, trees, molds, dust mite and mouse  History of reflux/heartburn: no Interested in Allergy Immunotherapy:  previously on [place] for 10 years  No recurrence of contact dermatitis when she gardens.  She is using gloves. Has clobetasol at home if needed   She spent 31 days hospitalized for severe diarrhea.  No diagnosis ultimately made, but diarrhea has improved with medications which she has slowly tapered off    Assessment and Plan: [name] is a 83 y.o. female with: Seasonal and perennial allergic rhinitis  Allergic contact dermatitis due to other agents  Seasonal allergic conjunctivitis  Allergy with anaphylaxis due to food Plan: Patient Instructions    Contact Dermatitis  -Continue avoidance measures with with gloves,  protective clothing when working outdoors -If it recurs use clobetasol cream  twice a daily until symptoms resolve.   Seasonal and perennial rhinitis:  - 07/05/21:  positive to grass, weed, trees, molds, dust mite and mouse  - Continue avoidance measures  - Continue  taking: Xyzal (levocetirizine) 5mg  tablet once daily - You can use an extra dose of the antihistamine, if needed, for breakthrough symptoms.  - Continue  nasal saline rinses 1-2 times daily to remove allergens from the nasal cavities as well as help with mucous clearance (this is especially helpful to do before the nasal sprays are given)  Food allergy:  - please strictly avoid shellfish, we can introduced this with a food challenge at some point if  you are interested  - Continue to carry epipen and follow allergy action plan for any reactions   Follow up: 6 months   Thank you so much for letting me partake in your care today.  Don't hesitate to reach out if you have any additional concerns!  Ferol Luz, MD  [facility] [place] [place] No follow-ups on file.  Meds ordered this encounter  Medications   levocetirizine (XYZAL) 5 MG tablet    Sig: Take 1 tablet (5 mg total) by mouth every evening.    Dispense:  30 tablet    Refill:  5    Lab Orders  No laboratory test(s) ordered today   Diagnostics: None done     Medication List:  Current Outpatient Medications  Medication Sig Dispense Refill   b complex vitamins capsule Take 1 capsule by mouth every evening.     Blood Glucose Monitoring Suppl  DEVI 1 each by Does not apply route 3 (three) times daily. May dispense any manufacturer covered by patient's insurance. 1 each 0   budesonide (ENTOCORT EC) 3 MG 24 hr capsule Take 3 capsules (9 mg total) by mouth daily. 90 capsule 3   calcium citrate (CALCITRATE - DOSED IN MG ELEMENTAL CALCIUM) 950 (200 Ca) MG tablet Take 500 mg of elemental calcium by mouth 2 (two) times daily.     cholestyramine  (QUESTRAN) 4 g packet Take 1 packet (4 g total) by mouth 2 (two) times daily. 60 packet 3   clobetasol cream (TEMOVATE) 0.05 % Apply 1 Application topically 2 (two) times daily as needed (Dermatitis).     diltiazem (CARDIZEM CD) 180 MG 24 hr capsule Take 180 mg by mouth at bedtime.     diphenoxylate-atropine (LOMOTIL) 2.5-0.025 MG tablet Take 1 tablet by mouth 4 (four) times daily. 120 tablet 3   flecainide (TAMBOCOR) 50 MG tablet Take 50-100 mg by mouth See admin instructions. Take 50mg  (1 tablet) by mouth every morning and 100mg  (2 tablets) every evening.     Glucose Blood (BLOOD GLUCOSE TEST STRIPS) STRP 1 each by Does not apply route 3 (three) times daily. Use as directed to check blood sugar. May dispense any manufacturer covered by patient's insurance and fits patient's device. 100 strip 0   insulin glargine (LANTUS) 100 UNIT/ML Solostar Pen Inject 12 Units into the skin daily. May substitute as needed per insurance. 15 mL 0   insulin lispro (HUMALOG) 100 UNIT/ML injection Inject 0-15 Units into the skin 4 (four) times daily. Per pump     Insulin Pen Needle (PEN NEEDLES) 31G X 5 MM MISC 1 each by Does not apply route 3 (three) times daily. May dispense any manufacturer covered by patient's insurance. 100 each 0   Insulin Syringe-Needle U-100 25G X 5/8" 1 ML MISC 1 each by Does not apply route 3 (three) times daily. May dispense any manufacturer covered by patient's insurance. 100 each 0   Lancet Device MISC 1 each by Does not apply route 3 (three) times daily. May dispense any manufacturer covered by patient's insurance. 1 each 0   Lancets MISC 1 each by Does not apply route 3 (three) times daily. Use as directed to check blood sugar. May dispense any manufacturer covered by patient's insurance and fits patient's device. 100 each 0   levocetirizine (XYZAL) 5 MG tablet Take 1 tablet (5 mg total) by mouth every evening. 30 tablet 5   LORazepam (ATIVAN) 0.5 MG tablet Take 0.5 mg by mouth at bedtime.      ondansetron (ZOFRAN-ODT) 4 MG disintegrating tablet Take 1 tablet (4 mg total) by mouth every 8 (eight) hours as needed for nausea or vomiting. 20 tablet 0   rivaroxaban (XARELTO) 20 MG TABS tablet Take 20 mg by mouth every evening.     simvastatin (ZOCOR) 20 MG tablet Take 10 mg by mouth at bedtime.     mirabegron ER (MYRBETRIQ) 50 MG TB24 tablet Take 50 mg by mouth in the morning.     No current facility-administered medications for this visit.   Allergies: Allergies  Allergen Reactions   Iodine Anaphylaxis   Ivp Dye [Iodinated Contrast Media] Anaphylaxis   Nickel Rash   Charentais Melon (French Melon) Swelling   Cherry Swelling   Codeine Other (See Comments)    Hallucinations    Olmesartan Diarrhea    Possible olmesartan enteropathy, other ARBs would be fine   Other Swelling  Tree nuts    Shellfish Allergy Swelling   Sulfa Antibiotics Swelling   Tape Dermatitis    Paper tape is best PAPER TAPE    I reviewed her past medical history, social history, family history, and environmental history and no significant changes have been reported from her previous visit.  ROS: All others negative except as noted per HPI.   Objective: BP 128/68   Pulse 91   Temp 97.9 F (36.6 C) (Temporal)   Resp 18   Wt 119 lb 11.2 oz (54.3 kg)   SpO2 98%   BMI 21.89 kg/m  Body mass index is 21.89 kg/m. General Appearance:  Alert, cooperative, no distress, appears stated age  Head:  Normocephalic, without obvious abnormality, atraumatic  Eyes:  Conjunctiva clear, EOM's intact  Nose: Nares normal, hypertrophic turbinates, no visible anterior polyps, and septum midline  Throat: Lips, tongue normal; teeth and gums normal, normal posterior oropharynx  Neck: Supple, symmetrical  Lungs:   clear to auscultation bilaterally, Respirations unlabored, no coughing  Heart:  regular rate and rhythm and no murmur, Appears well perfused  Extremities: No edema  Skin: Skin color, texture, turgor  normal, no rashes or lesions on visualized portions of skin   Neurologic: No gross deficits   Previous notes and tests were reviewed. The plan was reviewed with the patient/family, and all questions/concerned were addressed.  It was my pleasure to see Johnelle today and participate in her care. Please feel free to contact me with any questions or concerns.  Sincerely,  Ferol Luz, MD  Allergy & Immunology  [facility] Office: [phone]

## 2023-05-01 NOTE — Patient Instructions (Addendum)
   Contact Dermatitis  -Continue avoidance measures with with gloves, protective clothing when working outdoors -If it recurs use clobetasol cream  twice a daily until symptoms resolve.   Seasonal and perennial rhinitis:  - 07/05/21:  positive to grass, weed, trees, molds, dust mite and mouse  - Continue avoidance measures  - Continue  taking: Xyzal (levocetirizine) 5mg  tablet once daily - You can use an extra dose of the antihistamine, if needed, for breakthrough symptoms.  - Continue  nasal saline rinses 1-2 times daily to remove allergens from the nasal cavities as well as help with mucous clearance (this is especially helpful to do before the nasal sprays are given)  Food allergy:  - please strictly avoid shellfish, we can introduced this with a food challenge at some point if  you are interested  - Continue to carry epipen and follow allergy action plan for any reactions   Follow up: 6 months   Thank you so much for letting me partake in your care today.  Don't hesitate to reach out if you have any additional concerns!  Ferol Luz, MD  [facility] [place], [place]

## 2023-05-24 ENCOUNTER — Other Ambulatory Visit: Payer: Self-pay

## 2023-05-24 ENCOUNTER — Emergency Department (HOSPITAL_BASED_OUTPATIENT_CLINIC_OR_DEPARTMENT_OTHER)
Admission: EM | Admit: 2023-05-24 | Discharge: 2023-05-25 | Disposition: A | Discharge status: Home / Self Care | Attending: Emergency Medicine | Admitting: Emergency Medicine

## 2023-05-24 ENCOUNTER — Encounter (HOSPITAL_BASED_OUTPATIENT_CLINIC_OR_DEPARTMENT_OTHER): Payer: Self-pay | Admitting: Emergency Medicine

## 2023-05-24 DIAGNOSIS — M545 Low back pain, unspecified: Secondary | ICD-10-CM | POA: Diagnosis present

## 2023-05-24 DIAGNOSIS — R519 Headache, unspecified: Secondary | ICD-10-CM | POA: Insufficient documentation

## 2023-05-24 DIAGNOSIS — M25551 Pain in right hip: Secondary | ICD-10-CM | POA: Diagnosis not present

## 2023-05-24 DIAGNOSIS — E109 Type 1 diabetes mellitus without complications: Secondary | ICD-10-CM | POA: Insufficient documentation

## 2023-05-24 DIAGNOSIS — M542 Cervicalgia: Secondary | ICD-10-CM | POA: Diagnosis not present

## 2023-05-24 DIAGNOSIS — M791 Myalgia, unspecified site: Secondary | ICD-10-CM | POA: Insufficient documentation

## 2023-05-24 DIAGNOSIS — R52 Pain, unspecified: Secondary | ICD-10-CM

## 2023-05-24 DIAGNOSIS — Z794 Long term (current) use of insulin: Secondary | ICD-10-CM | POA: Insufficient documentation

## 2023-05-24 LAB — RESP PANEL BY RT-PCR (RSV, FLU A&B, COVID)  RVPGX2
Influenza A by PCR: NEGATIVE
Influenza B by PCR: NEGATIVE
Resp Syncytial Virus by PCR: NEGATIVE
SARS Coronavirus 2 by RT PCR: NEGATIVE

## 2023-05-24 IMAGING — MG MM DIGITAL SCREENING BILAT W/ TOMO AND CAD
8 series · 9 of 24 positions shown · non-contrast
Comparison: Previous exam(s).

CLINICAL DATA: Screening.

EXAM:
DIGITAL SCREENING BILATERAL MAMMOGRAM WITH TOMOSYNTHESIS AND CAD
TECHNIQUE: Bilateral screening digital craniocaudal and mediolateral oblique
mammograms were obtained. Bilateral screening digital breast
tomosynthesis was performed. The images were evaluated with
computer-aided detection.

[R MLO synth-2D]
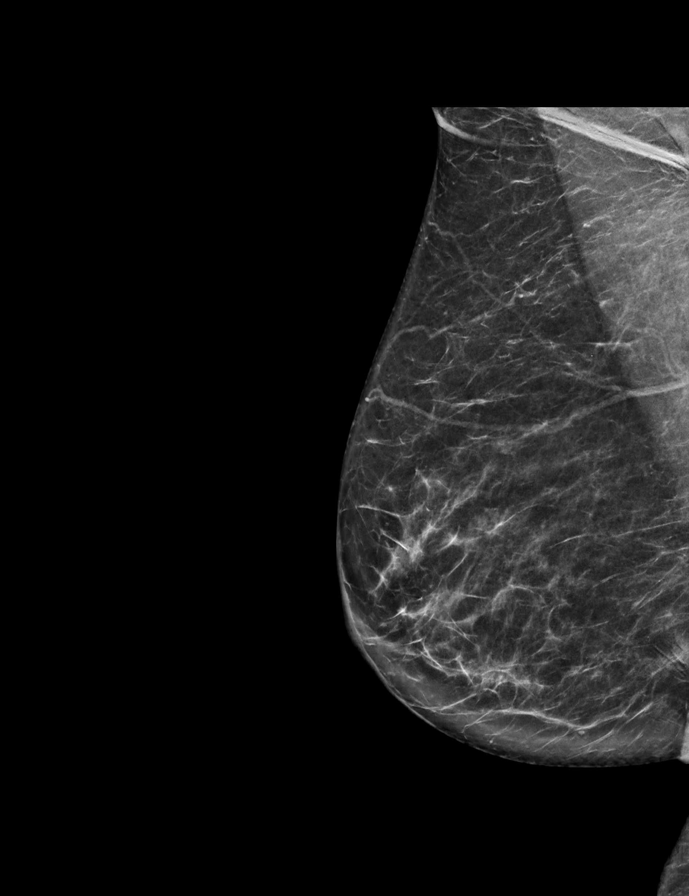

[L CC synth-2D]
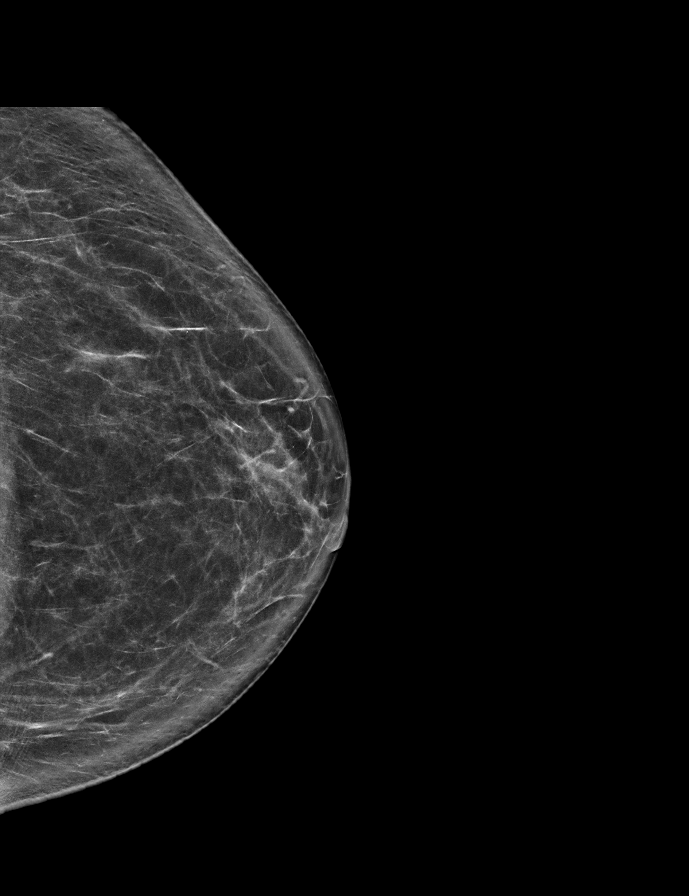

[R CC synth-2D]
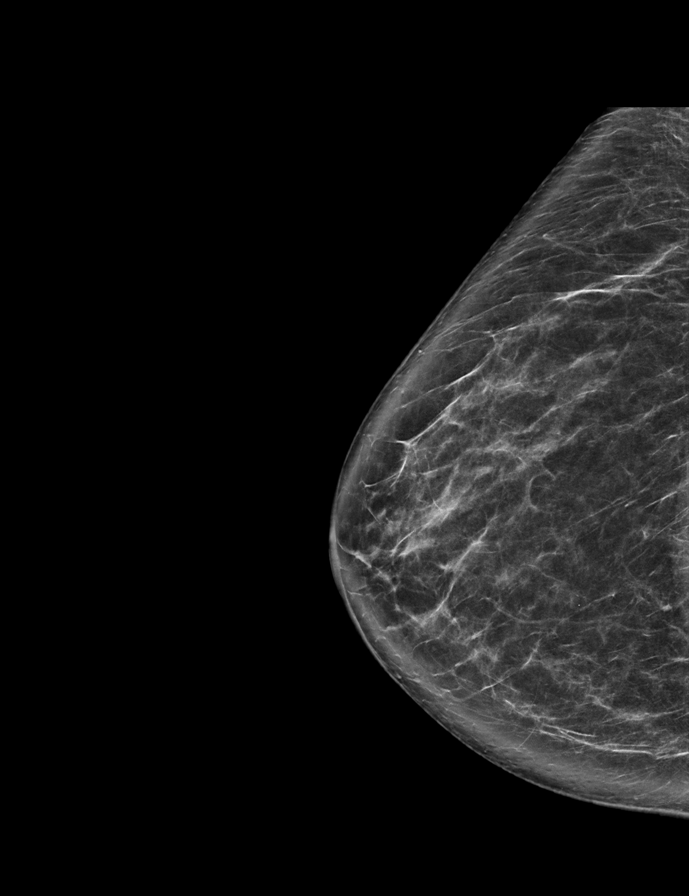

[L MLO synth-2D]
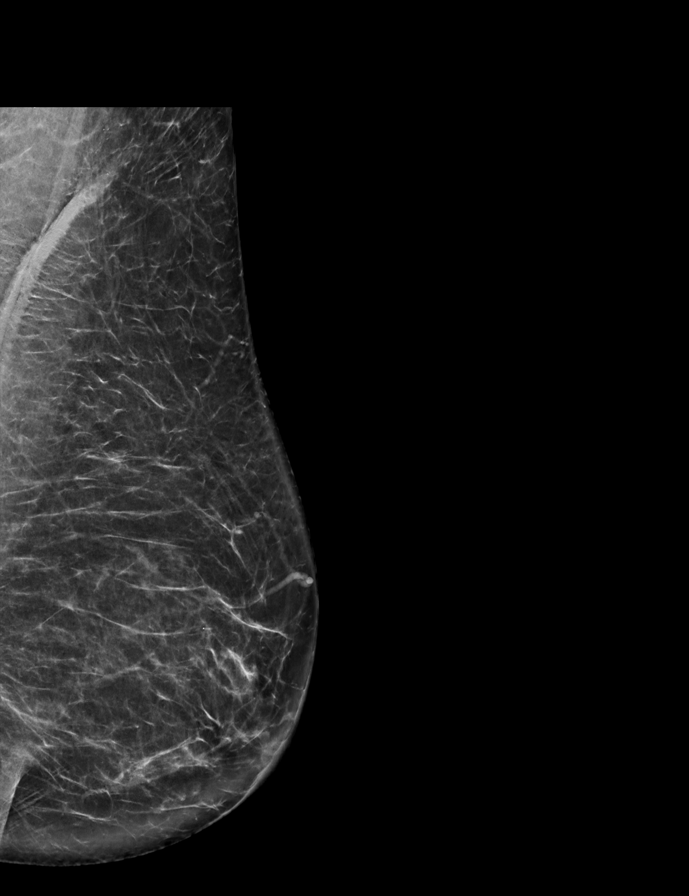

[L MLO tomo · 2 of 75 frames shown]
[frame 25/75]
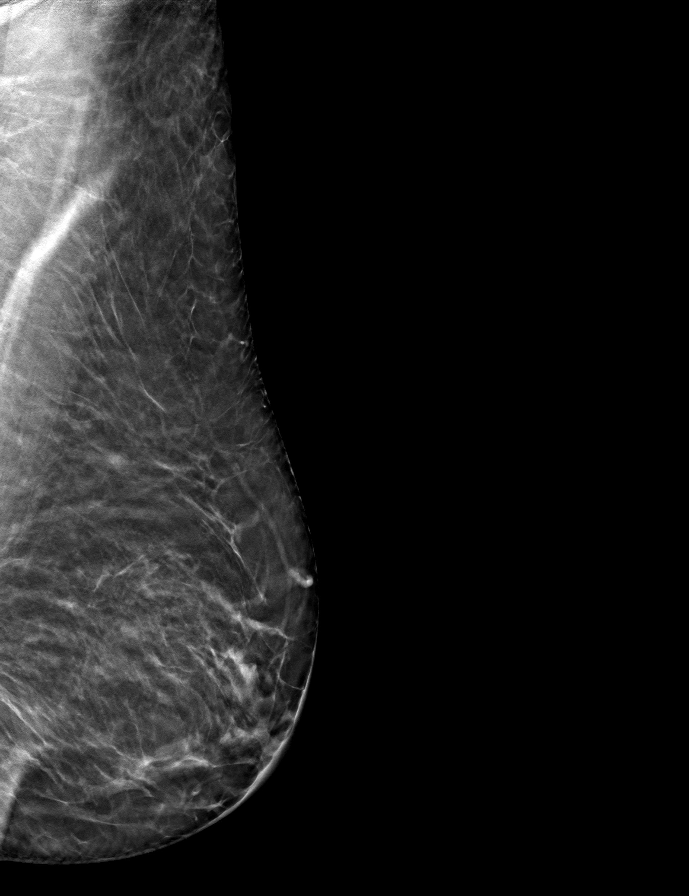
[frame 38/75]
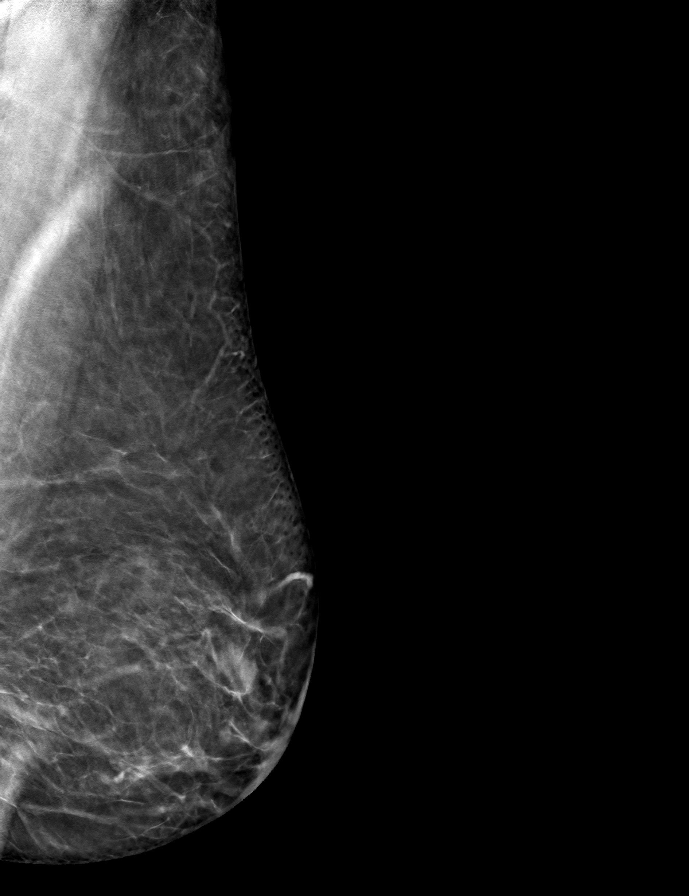

[L CC tomo · tomo slice 36/71.0]
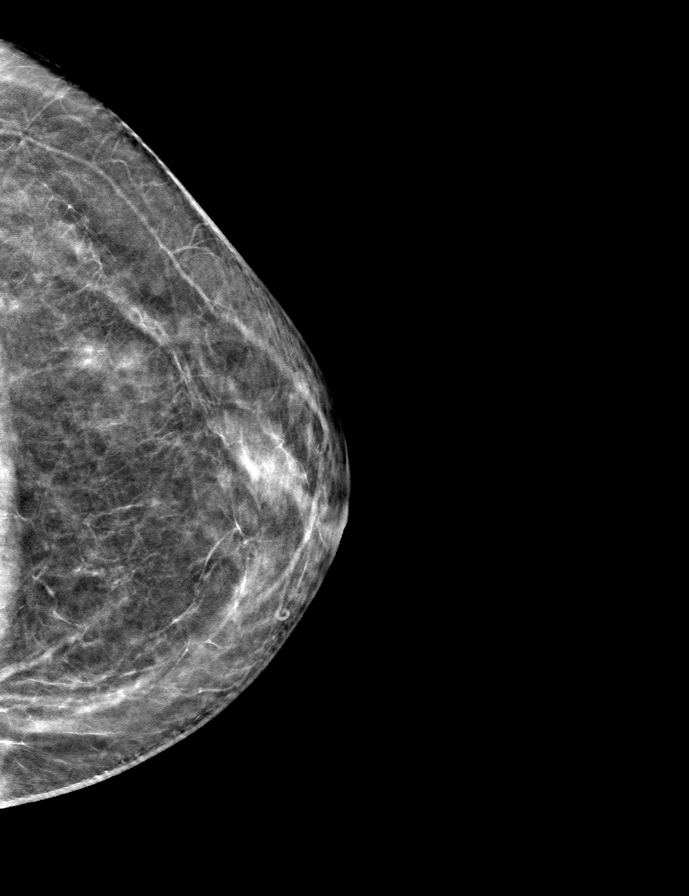

[R CC tomo · tomo slice 37/74.0]
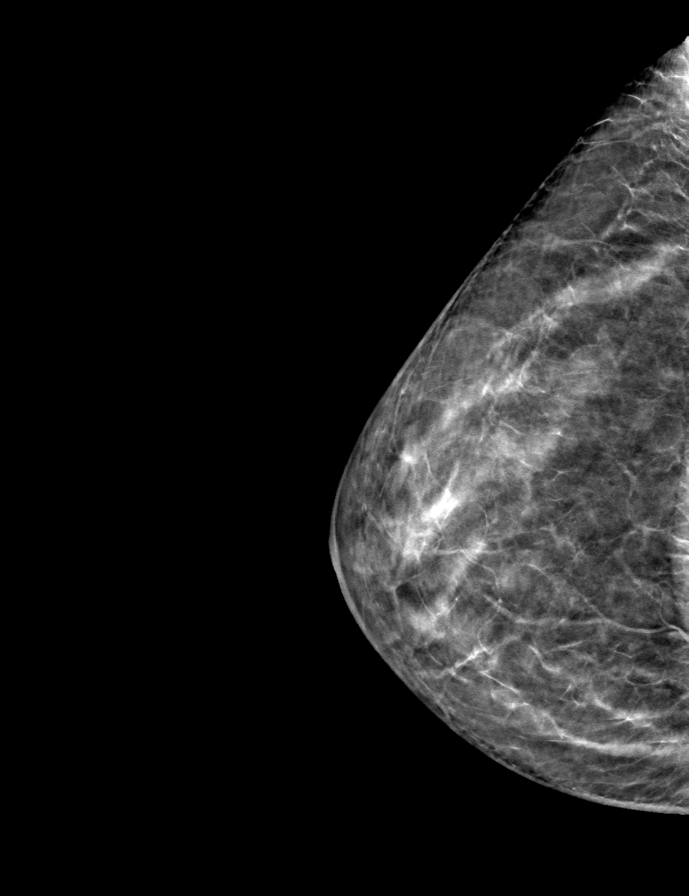

[R MLO tomo · tomo slice 36/71.0]
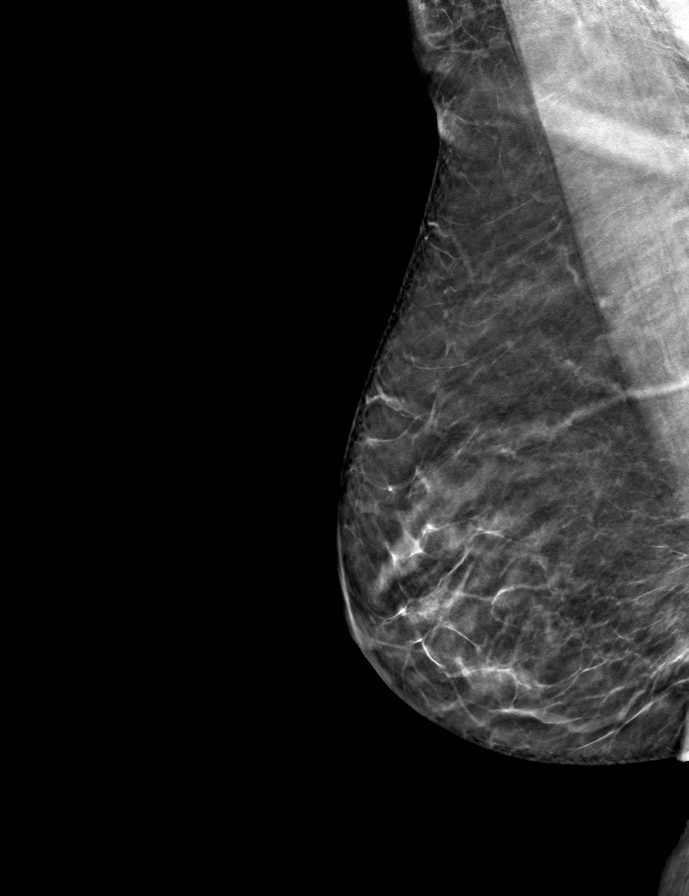

[9 of 24 positions shown; findings below may reference images not displayed]

ACR Breast Density Category b: There are scattered areas of
fibroglandular density.
FINDINGS: There are no findings suspicious for malignancy.
IMPRESSION: No mammographic evidence of malignancy. A result letter of this
screening mammogram will be mailed directly to the patient.

RECOMMENDATION:
Screening mammogram in one year. (Code:51-O-LD2)

BI-RADS CATEGORY  1: Negative.

## 2023-05-24 MED ORDER — OXYCODONE HCL 5 MG PO TABS
5.0000 mg | ORAL_TABLET | Freq: Once | ORAL | Status: AC
  Administered 2023-05-24: 5 mg via ORAL
  Filled 2023-05-24: qty 1

## 2023-05-24 MED ORDER — ACETAMINOPHEN 325 MG PO TABS
650.0000 mg | ORAL_TABLET | Freq: Once | ORAL | Status: AC
  Administered 2023-05-24: 650 mg via ORAL
  Filled 2023-05-24: qty 2

## 2023-05-24 NOTE — ED Triage Notes (Signed)
 Pt c/o LT hip, neck pain, RT shoulder pain since Sunday; no injury; noted to have low-grade fever in triage

## 2023-05-24 NOTE — ED Provider Notes (Signed)
  EMERGENCY DEPARTMENT AT [facility] Provider Note   CSN: [id number] Arrival date & time: 05/24/23  1829     History {Add pertinent medical, surgical, social history, OB history to HPI:1} Chief Complaint  Patient presents with   Pain    Donna Wiley is a 83 y.o. female.  The history is provided by the patient.  Donna Wiley is a 83 y.o. female who presents to the Emergency Department complaining of *** Lower Back, right hip, left shoulder, all neck, headache Hurts to turn neck. Had trouble lifting head off of pillow Sxs started Sunday. Worsening.  No reported fever No sorethroat, cough, runny nose, n/v/d, ap, dysuria. Has urinary frequency (long problem) Discharged from hospital for 31 days for diarrhea  No rash, tick bites.  T1DM, budesonide  makes sugar run high - finished two weeks ago.       Home Medications Prior to Admission medications   Medication Sig Start Date End Date Taking? Authorizing Provider  b complex vitamins capsule Take 1 capsule by mouth every evening.    [provider]  Blood Glucose Monitoring Suppl DEVI 1 each by Does not apply route 3 (three) times daily. May dispense any manufacturer covered by patient's insurance. 03/14/23   Danford, Willis Harter, MD  budesonide  (ENTOCORT EC ) 3 MG 24 hr capsule Take 3 capsules (9 mg total) by mouth daily. 03/15/23   Danford, Willis Harter, MD  calcium citrate (CALCITRATE - DOSED IN MG ELEMENTAL CALCIUM) 950 (200 Ca) MG tablet Take 500 mg of elemental calcium by mouth 2 (two) times daily.    [provider]  cholestyramine  (QUESTRAN ) 4 g packet Take 1 packet (4 g total) by mouth 2 (two) times daily. 03/14/23   Danford, Willis Harter, MD  clobetasol  cream (TEMOVATE ) 0.05 % Apply 1 Application topically 2 (two) times daily as needed (Dermatitis). 09/15/21   [provider]  diltiazem  (CARDIZEM  CD) 180 MG 24 hr capsule Take 180 mg by mouth at bedtime.     [provider]  diphenoxylate -atropine  (LOMOTIL ) 2.5-0.025 MG tablet Take 1 tablet by mouth 4 (four) times daily. 03/14/23   Danford, Willis Harter, MD  flecainide  (TAMBOCOR ) 50 MG tablet Take 50-100 mg by mouth See admin instructions. Take 50mg  (1 tablet) by mouth every morning and 100mg  (2 tablets) every evening.    [provider]  Glucose Blood (BLOOD GLUCOSE TEST STRIPS) STRP 1 each by Does not apply route 3 (three) times daily. Use as directed to check blood sugar. May dispense any manufacturer covered by patient's insurance and fits patient's device. 03/14/23   Danford, Willis Harter, MD  insulin  glargine (LANTUS ) 100 UNIT/ML Solostar Pen Inject 12 Units into the skin daily. May substitute as needed per insurance. 03/14/23   Danford, Willis Harter, MD  insulin  lispro (HUMALOG) 100 UNIT/ML injection Inject 0-15 Units into the skin 4 (four) times daily. Per pump    [provider]  Insulin  Pen Needle (PEN NEEDLES) 31G X 5 MM MISC 1 each by Does not apply route 3 (three) times daily. May dispense any manufacturer covered by patient's insurance. 03/14/23   Danford, Willis Harter, MD  Insulin  Syringe-Needle U-100 25G X 5/8" 1 ML MISC 1 each by Does not apply route 3 (three) times daily. May dispense any manufacturer covered by patient's insurance. 03/14/23   Danford, Willis Harter, MD  Lancet Device MISC 1 each by Does not apply route 3 (three) times daily. May dispense any manufacturer covered by patient's insurance.  03/14/23   Danford, Willis Harter, MD  Lancets MISC 1 each by Does not apply route 3 (three) times daily. Use as directed to check blood sugar. May dispense any manufacturer covered by patient's insurance and fits patient's device. 03/14/23   Danford, Willis Harter, MD  levocetirizine (XYZAL ) 5 MG tablet Take 1 tablet (5 mg total) by mouth every evening. 05/01/23   Orelia Binet, MD  LORazepam  (ATIVAN ) 0.5 MG tablet Take 0.5 mg by mouth at bedtime. 12/10/22    [provider]  mirabegron ER (MYRBETRIQ) 50 MG TB24 tablet Take 50 mg by mouth in the morning. 07/13/21 03/04/23  [provider]  ondansetron  (ZOFRAN -ODT) 4 MG disintegrating tablet Take 1 tablet (4 mg total) by mouth every 8 (eight) hours as needed for nausea or vomiting. 02/26/23   Curatolo, Adam, DO  rivaroxaban  (XARELTO ) 20 MG TABS tablet Take 20 mg by mouth every evening.    [provider]  simvastatin  (ZOCOR ) 20 MG tablet Take 10 mg by mouth at bedtime.    [provider]      Allergies    Iodine, Ivp dye [iodinated contrast media], Nickel, Charentais melon (french melon), Cherry, Codeine, Olmesartan, Other, Shellfish allergy , Sulfa antibiotics, and Tape    Review of Systems   Review of Systems  All other systems reviewed and are negative.   Physical Exam Updated Vital Signs BP (!) 160/79 (BP Location: Left Arm)   Pulse 99   Temp 100.2 F (37.9 C) (Oral)   Resp 20   Ht 5\' 2"  (1.575 m)   Wt 54.3 kg   SpO2 97%   BMI 21.90 kg/m  Physical Exam Vitals and nursing note reviewed.  Constitutional:      Appearance: She is well-developed.  HENT:     Head: Normocephalic and atraumatic.  Cardiovascular:     Rate and Rhythm: Normal rate and regular rhythm.     Heart sounds: No murmur heard. Pulmonary:     Effort: Pulmonary effort is normal. No respiratory distress.     Breath sounds: Normal breath sounds.  Abdominal:     Palpations: Abdomen is soft.     Tenderness: There is no abdominal tenderness. There is no guarding or rebound.  Musculoskeletal:     Comments: Pain referred to right posterior hip with active rom of the RLE. No significant pain with passive ROM. No bony tenderness to C/T/L spine  Skin:    General: Skin is warm and dry.  Neurological:     Mental Status: She is alert and oriented to person, place, and time.     Comments: 5/5 strength in all four extremities with sensation to light touch intact in all four extremities.    Psychiatric:        Behavior: Behavior normal.     ED Results / Procedures / Treatments   Labs (all labs ordered are listed, but only abnormal results are displayed) Labs Reviewed  RESP PANEL BY RT-PCR (RSV, FLU A&B, COVID)  [id number]    EKG None  Radiology No results found.  Procedures Procedures  {Document cardiac monitor, telemetry assessment procedure when appropriate:1}  Medications Ordered in ED Medications  acetaminophen  (TYLENOL ) tablet 650 mg (650 mg Oral Given 05/24/23 1845)    ED Course/ Medical Decision Making/ A&P   {   Click here for ABCD2, HEART and other calculatorsREFRESH Note before signing :1}  Medical Decision Making Risk OTC drugs.   ***  {Document critical care time when appropriate:1} {Document review of labs and clinical decision tools ie heart score, Chads2Vasc2 etc:1}  {Document your independent review of radiology images, and any outside records:1} {Document your discussion with family members, caretakers, and with consultants:1} {Document social determinants of health affecting pt's care:1} {Document your decision making why or why not admission, treatments were needed:1} Final Clinical Impression(s) / ED Diagnoses Final diagnoses:  None    Rx / DC Orders ED Discharge Orders     None

## 2023-05-25 ENCOUNTER — Emergency Department (HOSPITAL_BASED_OUTPATIENT_CLINIC_OR_DEPARTMENT_OTHER)

## 2023-05-25 LAB — COMPREHENSIVE METABOLIC PANEL WITH GFR
ALT: 20 U/L (ref 0–44)
AST: 22 U/L (ref 15–41)
Albumin: 4 g/dL (ref 3.5–5.0)
Alkaline Phosphatase: 69 U/L (ref 38–126)
Anion gap: 13 (ref 5–15)
BUN: 23 mg/dL (ref 8–23)
CO2: 22 mmol/L (ref 22–32)
Calcium: 9.9 mg/dL (ref 8.9–10.3)
Chloride: 102 mmol/L (ref 98–111)
Creatinine, Ser: 0.98 mg/dL (ref 0.44–1.00)
GFR, Estimated: 57 mL/min — ABNORMAL LOW (ref >60)
Glucose, Bld: 263 mg/dL — ABNORMAL HIGH (ref 70–99)
Potassium: 4.2 mmol/L (ref 3.5–5.1)
Sodium: 138 mmol/L (ref 135–145)
Total Bilirubin: 0.4 mg/dL (ref 0.0–1.2)
Total Protein: 6.6 g/dL (ref 6.5–8.1)

## 2023-05-25 LAB — CBC WITH DIFFERENTIAL/PLATELET
Abs Immature Granulocytes: 0.02 10*3/uL (ref 0.00–0.07)
Basophils Absolute: 0.1 10*3/uL (ref 0.0–0.1)
Basophils Relative: 1 %
Eosinophils Absolute: 0 10*3/uL (ref 0.0–0.5)
Eosinophils Relative: 0 %
HCT: 30.3 % — ABNORMAL LOW (ref 36.0–46.0)
Hemoglobin: 9.7 g/dL — ABNORMAL LOW (ref 12.0–15.0)
Immature Granulocytes: 0 %
Lymphocytes Relative: 17 %
Lymphs Abs: 1.4 10*3/uL (ref 0.7–4.0)
MCH: 27.1 pg (ref 26.0–34.0)
MCHC: 32 g/dL (ref 30.0–36.0)
MCV: 84.6 fL (ref 80.0–100.0)
Monocytes Absolute: 1.2 10*3/uL — ABNORMAL HIGH (ref 0.1–1.0)
Monocytes Relative: 14 %
Neutro Abs: 5.6 10*3/uL (ref 1.7–7.7)
Neutrophils Relative %: 68 %
Platelets: 249 10*3/uL (ref 150–400)
RBC: 3.58 MIL/uL — ABNORMAL LOW (ref 3.87–5.11)
RDW: 14.9 % (ref 11.5–15.5)
WBC: 8.3 10*3/uL (ref 4.0–10.5)
nRBC: 0 % (ref 0.0–0.2)

## 2023-05-25 LAB — URINALYSIS, MICROSCOPIC (REFLEX)

## 2023-05-25 LAB — URINALYSIS, ROUTINE W REFLEX MICROSCOPIC
Bilirubin Urine: NEGATIVE
Glucose, UA: >=500 mg/dL — AB
Hgb urine dipstick: NEGATIVE
Ketones, ur: 15 mg/dL — AB
Leukocytes,Ua: NEGATIVE
Nitrite: NEGATIVE
Protein, ur: NEGATIVE mg/dL
Specific Gravity, Urine: 1.015 (ref 1.005–1.030)
pH: 6 (ref 5.0–8.0)

## 2023-10-04 ENCOUNTER — Other Ambulatory Visit: Payer: Self-pay | Admitting: Medical

## 2023-10-04 DIAGNOSIS — M546 Pain in thoracic spine: Secondary | ICD-10-CM

## 2023-10-07 ENCOUNTER — Ambulatory Visit
Admission: RE | Admit: 2023-10-07 | Discharge: 2023-10-07 | Disposition: A | Discharge status: Home / Self Care | Source: Ambulatory Visit | Attending: Medical

## 2023-10-07 DIAGNOSIS — M546 Pain in thoracic spine: Secondary | ICD-10-CM

## 2023-10-30 ENCOUNTER — Ambulatory Visit: Admitting: Internal Medicine

## 2023-12-15 ENCOUNTER — Other Ambulatory Visit: Payer: Self-pay | Admitting: Obstetrics and Gynecology

## 2023-12-15 DIAGNOSIS — Z1231 Encounter for screening mammogram for malignant neoplasm of breast: Secondary | ICD-10-CM

## 2024-01-11 ENCOUNTER — Ambulatory Visit
Admission: RE | Admit: 2024-01-11 | Discharge: 2024-01-11 | Disposition: A | Discharge status: Home / Self Care | Source: Ambulatory Visit | Attending: Obstetrics and Gynecology | Admitting: Obstetrics and Gynecology

## 2024-01-11 DIAGNOSIS — Z1231 Encounter for screening mammogram for malignant neoplasm of breast: Secondary | ICD-10-CM
